# Patient Record
Sex: Male | Born: 1940
Health system: Southern US, Community
[De-identification: ages and names within clinical notes are randomized; demographics above are authoritative.]

## PROBLEM LIST (undated history)

## (undated) DIAGNOSIS — G4733 Obstructive sleep apnea (adult) (pediatric): Secondary | ICD-10-CM

## (undated) DIAGNOSIS — M1990 Unspecified osteoarthritis, unspecified site: Secondary | ICD-10-CM

## (undated) DIAGNOSIS — N400 Enlarged prostate without lower urinary tract symptoms: Secondary | ICD-10-CM

## (undated) DIAGNOSIS — R7303 Prediabetes: Secondary | ICD-10-CM

## (undated) DIAGNOSIS — M5136 Other intervertebral disc degeneration, lumbar region: Secondary | ICD-10-CM

## (undated) DIAGNOSIS — M51369 Other intervertebral disc degeneration, lumbar region without mention of lumbar back pain or lower extremity pain: Secondary | ICD-10-CM

## (undated) DIAGNOSIS — E785 Hyperlipidemia, unspecified: Secondary | ICD-10-CM

## (undated) DIAGNOSIS — R351 Nocturia: Secondary | ICD-10-CM

## (undated) DIAGNOSIS — K219 Gastro-esophageal reflux disease without esophagitis: Secondary | ICD-10-CM

## (undated) DIAGNOSIS — I10 Essential (primary) hypertension: Secondary | ICD-10-CM

## (undated) DIAGNOSIS — Z973 Presence of spectacles and contact lenses: Secondary | ICD-10-CM

## (undated) DIAGNOSIS — Z8551 Personal history of malignant neoplasm of bladder: Secondary | ICD-10-CM

## (undated) DIAGNOSIS — Z9989 Dependence on other enabling machines and devices: Secondary | ICD-10-CM

## (undated) HISTORY — PX: TRANSURETHRAL RESECTION OF BLADDER TUMOR: SHX2575

---

## 2004-06-10 ENCOUNTER — Encounter: Admission: RE | Admit: 2004-06-10 | Discharge: 2004-06-10 | Payer: Self-pay | Admitting: Cardiology

## 2004-06-13 ENCOUNTER — Encounter: Admission: RE | Admit: 2004-06-13 | Discharge: 2004-06-13 | Payer: Self-pay | Admitting: Cardiology

## 2005-08-11 ENCOUNTER — Ambulatory Visit (HOSPITAL_COMMUNITY): Admission: RE | Admit: 2005-08-11 | Discharge: 2005-08-11 | Payer: Self-pay | Admitting: Urology

## 2005-12-26 ENCOUNTER — Ambulatory Visit (HOSPITAL_COMMUNITY): Admission: RE | Admit: 2005-12-26 | Discharge: 2005-12-26 | Payer: Self-pay | Admitting: Urology

## 2007-04-25 ENCOUNTER — Encounter: Admission: RE | Admit: 2007-04-25 | Discharge: 2007-04-25 | Payer: Self-pay | Admitting: Cardiology

## 2007-06-05 ENCOUNTER — Ambulatory Visit (HOSPITAL_BASED_OUTPATIENT_CLINIC_OR_DEPARTMENT_OTHER): Admission: RE | Admit: 2007-06-05 | Discharge: 2007-06-05 | Payer: Self-pay | Admitting: Cardiology

## 2007-06-06 ENCOUNTER — Ambulatory Visit: Payer: Self-pay | Admitting: Internal Medicine

## 2008-09-23 ENCOUNTER — Emergency Department (HOSPITAL_COMMUNITY): Admission: EM | Admit: 2008-09-23 | Discharge: 2008-09-23 | Payer: Self-pay | Admitting: Emergency Medicine

## 2008-10-08 ENCOUNTER — Encounter (INDEPENDENT_AMBULATORY_CARE_PROVIDER_SITE_OTHER): Payer: Self-pay | Admitting: Urology

## 2008-10-08 ENCOUNTER — Ambulatory Visit (HOSPITAL_BASED_OUTPATIENT_CLINIC_OR_DEPARTMENT_OTHER): Admission: RE | Admit: 2008-10-08 | Discharge: 2008-10-08 | Payer: Self-pay | Admitting: Urology

## 2008-11-18 ENCOUNTER — Ambulatory Visit (HOSPITAL_BASED_OUTPATIENT_CLINIC_OR_DEPARTMENT_OTHER): Admission: RE | Admit: 2008-11-18 | Discharge: 2008-11-18 | Payer: Self-pay | Admitting: Urology

## 2008-11-18 ENCOUNTER — Encounter (INDEPENDENT_AMBULATORY_CARE_PROVIDER_SITE_OTHER): Payer: Self-pay | Admitting: Urology

## 2008-12-30 ENCOUNTER — Encounter (INDEPENDENT_AMBULATORY_CARE_PROVIDER_SITE_OTHER): Payer: Self-pay | Admitting: Urology

## 2008-12-30 ENCOUNTER — Ambulatory Visit (HOSPITAL_BASED_OUTPATIENT_CLINIC_OR_DEPARTMENT_OTHER): Admission: RE | Admit: 2008-12-30 | Discharge: 2008-12-30 | Payer: Self-pay | Admitting: Urology

## 2011-02-28 LAB — POCT I-STAT 4, (NA,K, GLUC, HGB,HCT)
Glucose, Bld: 113 mg/dL — ABNORMAL HIGH (ref 70–99)
HCT: 45 % (ref 39.0–52.0)
Sodium: 142 mEq/L (ref 135–145)

## 2011-03-01 LAB — POCT I-STAT 4, (NA,K, GLUC, HGB,HCT): Sodium: 141 mEq/L (ref 135–145)

## 2011-03-29 NOTE — Op Note (Signed)
NAME:  Tony Fox, Tony Fox              ACCOUNT NO.:  [id number]   MEDICAL RECORD NO.:  [id number]          PATIENT TYPE:  AMB   LOCATION:  [facility]                         FACILITY:  [facility]   PHYSICIAN:  Hanley Ben, M.D.  DATE OF BIRTH:  01/02/1941   DATE OF PROCEDURE:  10/08/2008  DATE OF DISCHARGE:                               OPERATIVE REPORT   PREOPERATIVE DIAGNOSIS:  Bladder tumors.   POSTOPERATIVE DIAGNOSES:  Bladder tumors.   PROCEDURES DONE:  1. Cystoscopy.  2. Transurethral resection bladder tumors.  3. Random bladder biopsy.   SURGEON:  Arvil Persons, M.D.   ANESTHESIA:  General.   INDICATIONS:  The patient is 70 year old male who had one episode of  gross hematuria about 2 weeks ago.  He went to the emergency room and a  CT scan showed some calcification on the anterior wall of the bladder.  Cystoscopy showed four tumors on the anterior wall of the bladder.  They  measure in total about 5 cm.  He is scheduled today for cystoscopy and  TUR bladder tumors.   DESCRIPTION OF PROCEDURE:  The patient was identified by his wrist band  and a proper timeout was taken.   Under general anesthesia, the patient was prepped and draped and placed  in the dorsal lithotomy position.  A panendoscope was inserted in the  bladder.  The anterior urethra was normal.  He has trilobar prostatic  hypertrophy.  The prostatic urethral length is about 5 cm.  There are  four tumors on the anterior wall of the bladder with some calcifications  on one of the tumors.  The tumors measure in total about 5 cm.  There is  no stone in the bladder.  The ureteral orifices are in normal position  and shape with clear efflux.  The cystoscope was then removed.  The  urethra was dilated up to #28-French.  Then a continuous flow Iglesias  resectoscope was inserted in the bladder.  It was a difficult resection  because of the location of the tumors on the anterior wall.  With  pressure on the anterior wall of  the bladder, I was able to resect all  the tumors.  The bladder tumors were then irrigated out of the bladder.  Then the base of the bladder tumor was resected for staging.  Hemostasis  was secured with electrocautery.  The resectoscope was then removed.  The cystoscope was reinserted in the bladder and a random bladder biopsy  was then done.  The cystoscope was then removed.  The resectoscope was  reinserted in the bladder and fulguration of the areas of biopsies was  done.  There was no evidence of bleeding at the end of the procedure.  The resectoscope was then  removed.  A #18-French Foley catheter was then inserted in the bladder.  Then 40 mg of mitomycin C in 40 ml of water were instilled in the  bladder and will be left in place for about 30 minutes.   The patient tolerated the procedure well and left the OR in satisfactory  condition to post anesthesia care unit.  Hanley Ben, M.D.  Electronically Signed     MN/MEDQ  D:  10/08/2008  T:  10/08/2008  Job:  [id number]

## 2011-03-29 NOTE — Op Note (Signed)
NAME:  Tony Fox, Tony Fox NO.:  [id number]   MEDICAL RECORD NO.:  [id number]          PATIENT TYPE:  AMB   LOCATION:  [facility]                         FACILITY:  [facility]   PHYSICIAN:  Hanley Ben, M.D.  DATE OF BIRTH:  1941-08-25   DATE OF PROCEDURE:  11/18/2008  DATE OF DISCHARGE:                               OPERATIVE REPORT   PREOPERATIVE DIAGNOSIS:  Rule out recurrent bladder tumor.   POSTOPERATIVE DIAGNOSIS:  Bladder tumor.   PROCEDURE:  Cystoscopy, TUR bladder tumor, and TUR biopsy of area of  previous resection.   SURGEON:  Arvil Persons, M.D.   ANESTHESIA:  General.   INDICATIONS:  The patient is a 70 year old male who had TUR bladder  tumor on October 08, 2008.  Pathology report showed a high-grade  invasive TCC suspicious for lymphovascular invasion.  Smooth muscle was  not identified in the specimen.  The patient is scheduled today for  cystoscopy, TUR biopsy of  the bladder tumor base to rule out muscle  invasive TCC.   The patient was identified by his wristband and proper time-out was  taken.   Under general anesthesia he was prepped and draped and placed in the  dorsal lithotomy position.  A #22 panendoscope was inserted in the  bladder.  The anterior urethra is normal.  He has trilobar prostatic  hypertrophy.  The bladder mucosa is reddened.  The ureteral orifices are  in normal position and shape.  There is an area of necrosis on the  anterior wall of the bladder, site of previous resection of the bladder  tumor.  Adjacent to that area there are 2 small superficial papillary  tumors on the right side of the area of resection of the previous  bladder tumors.  Those tumors were probably present at the time of  previous resection but were not resected.  The cystoscope was then  removed.  The urethra was dilated up to #30-French and a continuous flow  resectoscope was inserted in the bladder.  With pressure on the  abdominal wall, the small  papillary tumors were resected and sent in a  separate container to pathology.  Resection of the base from the area of  previous resected bladder tumor was done down to the muscle without  perforation of the bladder.  Several biopsies of that area were done and  those specimens were sent in a separate container also.  Then the  resectoscope was removed.  The cystoscope was reinserted in the bladder  and with the cold cup forceps biopsy of the base of the small bladder  tumors was also done.  Again because of the location of the tumor, it  was difficult to do a deep resection of the base of those bladder  tumors.  The cystoscope was then removed.  The resectoscope was  reinserted in the bladder and hemostasis was completed with  electrocautery.  Then the areas of resection of the bladder tumor wall  were fulgurated over a 2-cm resection margin.  There was no bleeding at the end of the procedure.  The bladder was  irrigated with  normal saline.  The resectoscope was then removed.  A #18-  French Foley catheter was then inserted in the bladder.   The patient tolerated the procedure well and left the OR in satisfactory  condition to postanesthesia care unit.      Hanley Ben, M.D.  Electronically Signed     MN/MEDQ  D:  11/18/2008  T:  11/18/2008  Job:  [id number]

## 2011-03-29 NOTE — Procedures (Signed)
NAME:  Tony Fox, Tony Fox NO.:  [id number]   MEDICAL RECORD NO.:  [id number]          PATIENT TYPE:  OUT   LOCATION:  [facility]                 FACILITY:  [facility]   PHYSICIAN:  Clinton D. Annamaria Boots, MD, FCCP, [place] OF BIRTH:  09/10/1941   DATE OF STUDY:  06/05/2007                            NOCTURNAL POLYSOMNOGRAM   REFERRING PHYSICIAN:  Ardyth Gal. Spruill, M.D.   INDICATION FOR STUDY:  Hypersomnia with sleep apnea   EPWORTH SLEEPINESS SCORE:  4/24, BMI 31.8, weight 225 pounds.   MEDICATIONS:  Home medications listed and reviewed.   SLEEP ARCHITECTURE:  Total sleep time 367 minutes with sleep efficiency  88%.  Stage I 3%, stage II 85%, stage III and IV absent.  RAM 11% of  total sleep time.  Sleep latency 2 minutes, RAM latency 34 minutes,  awake after sleep onset 48 minutes, arousal index 18.  No bedtime  medication taken.   RESPIRATORY DATA:  Split study protocol.  Apnea/hypopnea index (HI,  RDI), 16 obstructive events per hour indicating moderate obstructive  sleep apnea/hypopnea syndrome before C-PAP.  This reflected a total of  34 hypopneas before CPAP.  She slept only supine and all events were  recorded in that position.  RAM AHI 12 per hour.  CPAP was titrated to  14 CWP to control snoring.  AHI 0/hr.  A large Mirage Quattro mask was  used with heated humidifier.   OXYGEN DATA:  Snoring with desaturation to a nadir of 85% before CPAP.  After CPAP controlled, saturation help 94% on room air.   CARDIAC DATA:  Sinus rhythm with occasional PAC.   MOVEMENT-PARASOMNIA:  No significant movement disturbance.  Bathroom x2.   IMPRESSIONS-RECOMMENDATIONS:  1. Moderate obstructive sleep apnea/hypopnea syndrome, AHI 16/hr.  All      events were while supine.  Snoring with desaturation to a nadir of      85%.  2. Successful CPAP titration to 14 CWP, AHI 0/hr.  Lower pressures      control obstructive events but snoring      persisted to the final setting.  A  large Mirage Quattro mask was      used with heated humidifier.  3. Patient wore his oral device during his sleep study.      Clinton D. Annamaria Boots, MD, [facility], FACP  Diplomate, Tax adviser of Sleep Medicine  Electronically Signed     CDY/MEDQ  D:  06/10/2007 08:13:58  T:  06/11/2007 05:13:41  Job:  [id number]

## 2011-03-29 NOTE — Op Note (Signed)
NAME:  SOFIA, Raju              ACCOUNT NO.:  [id number]   MEDICAL RECORD NO.:  [id number]          PATIENT TYPE:  AMB   LOCATION:  [facility]                         FACILITY:  [facility]   PHYSICIAN:  Hanley Ben, M.D.  DATE OF BIRTH:  August 10, 1941   DATE OF PROCEDURE:  12/30/2008  DATE OF DISCHARGE:                               OPERATIVE REPORT   PREOPERATIVE DIAGNOSIS:  Rule out recurrent bladder tumor.   POSTOPERATIVE DIAGNOSIS:  No recurrent bladder tumor.   PROCEDURE DONE:  1. Cystoscopy.  2. TUR bladder biopsy from previous area of resection of the bladder      tumor and random bladder biopsy.   SURGEON:  Dr. Janice Norrie   ANESTHESIA:  General.   INDICATIONS:  The patient is 70 year old male who had TUR of the bladder  tumor on October 08, 2008.  He has had a high-grade papillary  urothelial carcinoma.  Repeat biopsy of the previous area of resection  of the bladder tumor was done on September 18, 2009.  There was no muscle  invasion.  He had 2 small papillary tumors adjacent to the previous area  of dissection.  Those tumors were resected.  However, the base of those  bladder tumors showed no presence of muscle.  Prior to proceeding with  BCG, he needs repeat bladder biopsy.   The patient was identified by his wristband, and proper time-out was  taken.  He was under general anesthesia.  He was prepped and draped and  placed in the dorsal lithotomy position.  A panendoscope was inserted in  the bladder.  The anterior urethra is normal.  He has trilobar prostatic  hypertrophy.  There is no evidence of gross tumor in the bladder.  The  area from previous resection shows some areas of necrosis; however,  there is no evidence of gross tumor.  The ureteral orifices are in  normal position and shape with clear efflux.  The cystoscope was  removed.  The urethra was then dilated up to #30-French with Leander Rams  sounds, and a resectoscope was inserted in the bladder.  Resection of  the  previous resected bladder tumor was done down to the level of the  muscle.  Hemostasis was secured with electrocautery.  The resectoscope  was then removed.  The cystoscope was reinserted in the bladder.  And  random bladder biopsy was then done.  The areas of biopsy were then  fulgurated with the Bugbee electrode.  There was no evidence of bleeding  at the end of the procedure.  The cystoscope was removed.  A #16-French  Foley catheter was then inserted in the bladder.   The patient tolerated the procedure well and left the OR in satisfactory  condition to postanesthesia care unit.      Hanley Ben, M.D.  Electronically Signed    MN/MEDQ  D:  12/30/2008  T:  12/30/2008  Job:  [id number]

## 2011-08-16 LAB — URINALYSIS, ROUTINE W REFLEX MICROSCOPIC
Bilirubin Urine: NEGATIVE
Glucose, UA: NEGATIVE
Ketones, ur: NEGATIVE
Protein, ur: 100 — AB
Urobilinogen, UA: 0.2
pH: 5.5

## 2011-08-16 LAB — URINE MICROSCOPIC-ADD ON

## 2011-08-16 LAB — POCT I-STAT 4, (NA,K, GLUC, HGB,HCT)
Glucose, Bld: 99
HCT: 44
Hemoglobin: 15

## 2011-08-16 LAB — URINE CULTURE

## 2011-12-12 ENCOUNTER — Other Ambulatory Visit: Payer: Self-pay | Admitting: Cardiology

## 2012-03-15 ENCOUNTER — Other Ambulatory Visit: Payer: Self-pay | Admitting: Neurosurgery

## 2012-03-15 DIAGNOSIS — M542 Cervicalgia: Secondary | ICD-10-CM

## 2012-03-15 DIAGNOSIS — M549 Dorsalgia, unspecified: Secondary | ICD-10-CM

## 2012-03-20 ENCOUNTER — Ambulatory Visit
Admission: RE | Admit: 2012-03-20 | Discharge: 2012-03-20 | Disposition: A | Discharge status: Home / Self Care | Payer: Medicare Other | Source: Ambulatory Visit | Attending: Neurosurgery | Admitting: Neurosurgery

## 2012-03-20 DIAGNOSIS — M549 Dorsalgia, unspecified: Secondary | ICD-10-CM

## 2012-03-20 DIAGNOSIS — M542 Cervicalgia: Secondary | ICD-10-CM

## 2014-02-26 ENCOUNTER — Other Ambulatory Visit: Payer: Self-pay | Admitting: Urology

## 2014-03-06 ENCOUNTER — Encounter (HOSPITAL_BASED_OUTPATIENT_CLINIC_OR_DEPARTMENT_OTHER): Payer: Self-pay | Admitting: *Deleted

## 2014-03-06 NOTE — Progress Notes (Signed)
NPO AFTER MN. ARRIVE AT 0800. NEEDS CBC, BMET, PT/INR ,  PTT AND EKG. WILL TAKE EXFORGE AND CRESTOR AM DOS W/ SIPS OF WATER.

## 2014-03-07 ENCOUNTER — Encounter (HOSPITAL_BASED_OUTPATIENT_CLINIC_OR_DEPARTMENT_OTHER): Payer: Medicare Other | Admitting: Anesthesiology

## 2014-03-07 ENCOUNTER — Ambulatory Visit (HOSPITAL_BASED_OUTPATIENT_CLINIC_OR_DEPARTMENT_OTHER)
Admission: RE | Admit: 2014-03-07 | Discharge: 2014-03-07 | Disposition: A | Discharge status: Home / Self Care | Payer: Medicare Other | Source: Ambulatory Visit | Attending: Urology | Admitting: Urology

## 2014-03-07 ENCOUNTER — Encounter (HOSPITAL_BASED_OUTPATIENT_CLINIC_OR_DEPARTMENT_OTHER)
Admission: RE | Disposition: A | Discharge status: Home / Self Care | Payer: Self-pay | Source: Ambulatory Visit | Attending: Urology

## 2014-03-07 ENCOUNTER — Encounter (HOSPITAL_BASED_OUTPATIENT_CLINIC_OR_DEPARTMENT_OTHER): Payer: Self-pay | Admitting: Anesthesiology

## 2014-03-07 ENCOUNTER — Ambulatory Visit (HOSPITAL_BASED_OUTPATIENT_CLINIC_OR_DEPARTMENT_OTHER): Payer: Medicare Other | Admitting: Anesthesiology

## 2014-03-07 DIAGNOSIS — R351 Nocturia: Secondary | ICD-10-CM | POA: Insufficient documentation

## 2014-03-07 DIAGNOSIS — M129 Arthropathy, unspecified: Secondary | ICD-10-CM | POA: Insufficient documentation

## 2014-03-07 DIAGNOSIS — N200 Calculus of kidney: Secondary | ICD-10-CM | POA: Insufficient documentation

## 2014-03-07 DIAGNOSIS — N138 Other obstructive and reflux uropathy: Secondary | ICD-10-CM | POA: Insufficient documentation

## 2014-03-07 DIAGNOSIS — Z79899 Other long term (current) drug therapy: Secondary | ICD-10-CM | POA: Insufficient documentation

## 2014-03-07 DIAGNOSIS — R3915 Urgency of urination: Secondary | ICD-10-CM | POA: Insufficient documentation

## 2014-03-07 DIAGNOSIS — Z87442 Personal history of urinary calculi: Secondary | ICD-10-CM | POA: Insufficient documentation

## 2014-03-07 DIAGNOSIS — I10 Essential (primary) hypertension: Secondary | ICD-10-CM | POA: Insufficient documentation

## 2014-03-07 DIAGNOSIS — N401 Enlarged prostate with lower urinary tract symptoms: Secondary | ICD-10-CM | POA: Insufficient documentation

## 2014-03-07 DIAGNOSIS — E7800 Pure hypercholesterolemia, unspecified: Secondary | ICD-10-CM | POA: Insufficient documentation

## 2014-03-07 DIAGNOSIS — Z8551 Personal history of malignant neoplasm of bladder: Secondary | ICD-10-CM | POA: Insufficient documentation

## 2014-03-07 DIAGNOSIS — E119 Type 2 diabetes mellitus without complications: Secondary | ICD-10-CM | POA: Insufficient documentation

## 2014-03-07 DIAGNOSIS — G4730 Sleep apnea, unspecified: Secondary | ICD-10-CM | POA: Insufficient documentation

## 2014-03-07 HISTORY — DX: Nocturia: R35.1

## 2014-03-07 HISTORY — DX: Other intervertebral disc degeneration, lumbar region without mention of lumbar back pain or lower extremity pain: M51.369

## 2014-03-07 HISTORY — DX: Prediabetes: R73.03

## 2014-03-07 HISTORY — PX: CYSTOSCOPY WITH BIOPSY: SHX5122

## 2014-03-07 HISTORY — DX: Other intervertebral disc degeneration, lumbar region: M51.36

## 2014-03-07 HISTORY — DX: Essential (primary) hypertension: I10

## 2014-03-07 HISTORY — DX: Benign prostatic hyperplasia without lower urinary tract symptoms: N40.0

## 2014-03-07 HISTORY — DX: Unspecified osteoarthritis, unspecified site: M19.90

## 2014-03-07 HISTORY — DX: Obstructive sleep apnea (adult) (pediatric): G47.33

## 2014-03-07 HISTORY — DX: Gastro-esophageal reflux disease without esophagitis: K21.9

## 2014-03-07 HISTORY — DX: Hyperlipidemia, unspecified: E78.5

## 2014-03-07 HISTORY — DX: Personal history of malignant neoplasm of bladder: Z85.51

## 2014-03-07 HISTORY — DX: Presence of spectacles and contact lenses: Z97.3

## 2014-03-07 HISTORY — DX: Dependence on other enabling machines and devices: Z99.89

## 2014-03-07 LAB — BASIC METABOLIC PANEL
BUN: 22 mg/dL (ref 6–23)
CALCIUM: 9.2 mg/dL (ref 8.4–10.5)
CO2: 23 meq/L (ref 19–32)
CREATININE: 1.41 mg/dL — AB (ref 0.50–1.35)
Chloride: 99 mEq/L (ref 96–112)
GFR calc Af Amer: 55 mL/min — ABNORMAL LOW (ref >90)
GFR calc non Af Amer: 48 mL/min — ABNORMAL LOW (ref >90)
GLUCOSE: 104 mg/dL — AB (ref 70–99)
Potassium: 4.2 mEq/L (ref 3.7–5.3)
SODIUM: 134 meq/L — AB (ref 137–147)

## 2014-03-07 LAB — CBC
HCT: 42.8 % (ref 39.0–52.0)
Hemoglobin: 14.7 g/dL (ref 13.0–17.0)
MCH: 29.5 pg (ref 26.0–34.0)
MCHC: 34.3 g/dL (ref 30.0–36.0)
MCV: 85.8 fL (ref 78.0–100.0)
Platelets: 128 10*3/uL — ABNORMAL LOW (ref 150–400)
RBC: 4.99 MIL/uL (ref 4.22–5.81)
RDW: 12.6 % (ref 11.5–15.5)
WBC: 3.5 10*3/uL — AB (ref 4.0–10.5)

## 2014-03-07 LAB — APTT: aPTT: 31 seconds (ref 24–37)

## 2014-03-07 LAB — PROTIME-INR
INR: 0.98 (ref 0.00–1.49)
Prothrombin Time: 12.8 seconds (ref 11.6–15.2)

## 2014-03-07 LAB — GLUCOSE, CAPILLARY: Glucose-Capillary: 95 mg/dL (ref 70–99)

## 2014-03-07 SURGERY — CYSTOSCOPY, WITH BIOPSY
Anesthesia: General | Site: Bladder

## 2014-03-07 MED ORDER — MEPERIDINE HCL 25 MG/ML IJ SOLN
6.2500 mg | INTRAMUSCULAR | Status: DC | PRN
  Filled 2014-03-07: qty 1

## 2014-03-07 MED ORDER — FENTANYL CITRATE 0.05 MG/ML IJ SOLN
INTRAMUSCULAR | Status: DC | PRN
  Administered 2014-03-07 (×2): 50 ug via INTRAVENOUS

## 2014-03-07 MED ORDER — LIDOCAINE HCL (CARDIAC) 20 MG/ML IV SOLN
INTRAVENOUS | Status: DC | PRN
  Administered 2014-03-07: 50 mg via INTRAVENOUS

## 2014-03-07 MED ORDER — SUCCINYLCHOLINE CHLORIDE 20 MG/ML IJ SOLN
INTRAMUSCULAR | Status: DC | PRN
  Administered 2014-03-07: 140 mg via INTRAVENOUS

## 2014-03-07 MED ORDER — ONDANSETRON HCL 4 MG/2ML IJ SOLN
INTRAMUSCULAR | Status: DC | PRN
  Administered 2014-03-07: 4 mg via INTRAVENOUS

## 2014-03-07 MED ORDER — CEFAZOLIN SODIUM 1-5 GM-% IV SOLN
1.0000 g | INTRAVENOUS | Status: DC
  Filled 2014-03-07: qty 50

## 2014-03-07 MED ORDER — STERILE WATER FOR IRRIGATION IR SOLN
Status: DC | PRN
  Administered 2014-03-07: 6000 mL

## 2014-03-07 MED ORDER — EPHEDRINE SULFATE 50 MG/ML IJ SOLN
INTRAMUSCULAR | Status: DC | PRN
  Administered 2014-03-07: 15 mg via INTRAVENOUS

## 2014-03-07 MED ORDER — DEXAMETHASONE SODIUM PHOSPHATE 4 MG/ML IJ SOLN
INTRAMUSCULAR | Status: DC | PRN
  Administered 2014-03-07: 10 mg via INTRAVENOUS

## 2014-03-07 MED ORDER — ACETAMINOPHEN 10 MG/ML IV SOLN
INTRAVENOUS | Status: DC | PRN
  Administered 2014-03-07: 1000 mg via INTRAVENOUS

## 2014-03-07 MED ORDER — PROMETHAZINE HCL 25 MG/ML IJ SOLN
6.2500 mg | INTRAMUSCULAR | Status: DC | PRN
  Filled 2014-03-07: qty 1

## 2014-03-07 MED ORDER — GLYCOPYRROLATE 0.2 MG/ML IJ SOLN
INTRAMUSCULAR | Status: DC | PRN
  Administered 2014-03-07: 0.2 mg via INTRAVENOUS

## 2014-03-07 MED ORDER — CEFAZOLIN SODIUM-DEXTROSE 2-3 GM-% IV SOLR
2.0000 g | INTRAVENOUS | Status: AC
  Administered 2014-03-07: 2 g via INTRAVENOUS
  Filled 2014-03-07: qty 50

## 2014-03-07 MED ORDER — FENTANYL CITRATE 0.05 MG/ML IJ SOLN
25.0000 ug | INTRAMUSCULAR | Status: DC | PRN
  Filled 2014-03-07: qty 1

## 2014-03-07 MED ORDER — LACTATED RINGERS IV SOLN
INTRAVENOUS | Status: DC
  Administered 2014-03-07 (×2): via INTRAVENOUS
  Filled 2014-03-07: qty 1000

## 2014-03-07 MED ORDER — FENTANYL CITRATE 0.05 MG/ML IJ SOLN
INTRAMUSCULAR | Status: AC
  Filled 2014-03-07: qty 4

## 2014-03-07 MED ORDER — PROPOFOL 10 MG/ML IV BOLUS
INTRAVENOUS | Status: DC | PRN
  Administered 2014-03-07: 180 mg via INTRAVENOUS

## 2014-03-07 SURGICAL SUPPLY — 28 items
ADAPTER CATH URET PLST 4-6FR (CATHETERS) ×2 IMPLANT
ADPR CATH URET STRL DISP 4-6FR (CATHETERS) ×1
BAG DRAIN URO-CYSTO SKYTR STRL (DRAIN) ×3 IMPLANT
BAG DRN UROCATH (DRAIN) ×1
CANISTER SUCT LVC 12 LTR MEDI- (MISCELLANEOUS) ×2 IMPLANT
CATH INTERMIT  6FR 70CM (CATHETERS) ×4 IMPLANT
CLOTH BEACON ORANGE TIMEOUT ST (SAFETY) ×3 IMPLANT
CONT SPECI 4OZ STER CLIK (MISCELLANEOUS) ×4 IMPLANT
DRAPE CAMERA CLOSED 9X96 (DRAPES) ×2 IMPLANT
ELECT REM PT RETURN 9FT ADLT (ELECTROSURGICAL) ×3
ELECTRODE REM PT RTRN 9FT ADLT (ELECTROSURGICAL) ×1 IMPLANT
GLOVE BIO SURGEON STRL SZ 6.5 (GLOVE) ×1 IMPLANT
GLOVE BIO SURGEON STRL SZ7 (GLOVE) ×3 IMPLANT
GLOVE BIO SURGEONS STRL SZ 6.5 (GLOVE) ×1
GLOVE INDICATOR 6.5 STRL GRN (GLOVE) ×2 IMPLANT
GOWN STRL REUS W/ TWL LRG LVL3 (GOWN DISPOSABLE) IMPLANT
GOWN STRL REUS W/TWL LRG LVL3 (GOWN DISPOSABLE) ×3
GOWN STRL REUS W/TWL XL LVL3 (GOWN DISPOSABLE) ×2 IMPLANT
GUIDEWIRE ANG ZIPWIRE 038X150 (WIRE) ×4 IMPLANT
GUIDEWIRE STR DUAL SENSOR (WIRE) ×2 IMPLANT
NDL SAFETY ECLIPSE 18X1.5 (NEEDLE) IMPLANT
NEEDLE HYPO 18GX1.5 SHARP (NEEDLE)
NEEDLE HYPO 22GX1.5 SAFETY (NEEDLE) IMPLANT
NS IRRIG 500ML POUR BTL (IV SOLUTION) IMPLANT
PACK CYSTOSCOPY (CUSTOM PROCEDURE TRAY) ×3 IMPLANT
SYR 20CC LL (SYRINGE) IMPLANT
SYRINGE IRR TOOMEY STRL 70CC (SYRINGE) ×2 IMPLANT
WATER STERILE IRR 3000ML UROMA (IV SOLUTION) ×3 IMPLANT

## 2014-03-07 NOTE — Anesthesia Procedure Notes (Signed)
Procedure Name: Intubation Date/Time: 03/07/2014 10:06 AM Performed by: Mechele Claude Pre-anesthesia Checklist: Patient identified, Emergency Drugs available, Suction available and Patient being monitored Patient Re-evaluated:Patient Re-evaluated prior to inductionOxygen Delivery Method: Circle System Utilized Preoxygenation: Pre-oxygenation with 100% oxygen Intubation Type: IV induction, Rapid sequence and Cricoid Pressure applied Laryngoscope Size: Mac and 4 Grade View: Grade I Tube type: Oral Tube size: 8.0 mm Number of attempts: 1 Airway Equipment and Method: stylet Placement Confirmation: ETT inserted through vocal cords under direct vision,  positive ETCO2 and breath sounds checked- equal and bilateral Secured at: 22 cm Tube secured with: Tape Dental Injury: Teeth and Oropharynx as per pre-operative assessment

## 2014-03-07 NOTE — Discharge Instructions (Signed)
CYSTOSCOPY HOME CARE INSTRUCTIONS  Activity: Rest for the remainder of the day.  Do not drive or operate equipment today.  You may resume normal activities in one to two days as instructed by your physician.   Meals: Drink plenty of liquids and eat light foods such as gelatin or soup this evening.  You may return to a normal meal plan tomorrow.  Return to Work: You may return to work in one to two days or as instructed by your physician.  Special Instructions / Symptoms: Call your physician if any of these symptoms occur:   -persistent or heavy bleeding  -bleeding which continues after first few urination  -large blood clots that are difficult to pass  -urine stream diminishes or stops completely  -fever equal to or higher than 101 degrees Farenheit.  -cloudy urine with a strong, foul odor  -severe pain  Females should always wipe from front to back after elimination.  You may feel some burning pain when you urinate.  This should disappear with time.  Applying moist heat to the lower abdomen or a hot tub bath may help relieve the pain. \  Follow-Up / Date of Return Visit to Your Physician:  *** Call for an appointment to arrange follow-up.  Patient Signature:  ________________________________________________________  Nurse's Signature:  ________________________________________________________  Post Anesthesia Home Care Instructions  Activity: Get plenty of rest for the remainder of the day. A responsible adult should stay with you for 24 hours following the procedure.  For the next 24 hours, DO NOT: -Drive a car -Paediatric nurse -Drink alcoholic beverages -Take any medication unless instructed by your physician -Make any legal decisions or sign important papers.  Meals: Start with liquid foods such as gelatin or soup. Progress to regular foods as tolerated. Avoid greasy, spicy, heavy foods. If nausea and/or vomiting occur, drink only clear liquids until the nausea and/or  vomiting subsides. Call your physician if vomiting continues.  Special Instructions/Symptoms: Your throat may feel dry or sore from the anesthesia or the breathing tube placed in your throat during surgery. If this causes discomfort, gargle with warm salt water. The discomfort should disappear within 24 hours.

## 2014-03-07 NOTE — Transfer of Care (Signed)
Immediate Anesthesia Transfer of Care Note  Patient: Tony Fox  Procedure(s) Performed: Procedure(s) (LRB): CYSTOSCOPY WITH Bladder Washings and Bladder BIOPSY (N/A)  Patient Location: PACU  Anesthesia Type: General  Level of Consciousness: awake, alert  and oriented  Airway & Oxygen Therapy: Patient Spontanous Breathing and Patient connected to face mask oxygen  Post-op Assessment: Report given to PACU RN and Post -op Vital signs reviewed and stable  Post vital signs: Reviewed and stable  Complications: No apparent anesthesia complications

## 2014-03-07 NOTE — Op Note (Signed)
Tony Fox is a 73 y.o.   03/07/2014  General  Preop diagnosis: Rule out recurrent bladder tumor. Abnormal urine cytology  Post op diagnosis: No recurrent bladder tumor  Surgeon: Charlene Brooke. [name]  Anesthesia: General  Indication: [name] is a 73 years old male who had a TUR bladder tumor in November 2009 for high-grade TCC. Repeat biopsy on 11/18/2008 and showed fragments of residual TCC. Another biopsy on 12/30/2008 revealed no residual disease. He had intravesical BCG and has not had any recurrence since. He was lost to follow her for about 3 years. Cystoscopy on 01/30/2014 showed no recurrent bladder tumor. CT scan showed known left renal calculi and no filling defect in the kidneys nor in the bladder. Urine cytology showed atypical cells. He is scheduled today for cystoscopy, bladder biopsy, washings for cytology and also washings of the renal pelves.  Procedure: The patient was identified by his wrist band and proper timeout was taken.  Under general anesthesia he was prepped and draped and placed in the dorsolithotomy position. A panendoscope was inserted in the bladder. The anterior urethra is normal. He has moderate prostatic hypertrophy. The bladder mucosa is normal there is no stone or tumor in the bladder. The ureteral orifices are in normal position and shape. The bladder was then irrigated with normal saline and bladder washings were sent for cytology.  A Glidewire was passed and through a #6 [place] open-ended catheter and through the cystoscope and the left ureter up to the renal pelvis. The Glidewire was removed. Urine from the left renal pelvis was obtained for cytology. The renal pelvis was irrigated with normal saline and washings were sent also for cytology. The Glidewire and open-ended ureteral catheter were removed and discarded. Then a new open-ended catheter and a new glide wire were passed through the cystoscope and the right ureter up to the renal pelvis. The  Glidewire was removed. Washings from the right renal pelvis were sent for cytology. The open-ended catheter was removed.  Random bladder biopsy was done with the cold cup forceps. The areas of biopsy were then fulgurated with the Bugbee electrode. There was no evidence of bleeding at the end of the procedure. The bladder was emptied and the cystoscope removed.  The patient tolerated the procedure well and left the OR in satisfactory condition to postanesthesia care unit

## 2014-03-07 NOTE — Anesthesia Postprocedure Evaluation (Signed)
  Anesthesia Post-op Note  Patient: Tony Fox  Procedure(s) Performed: Procedure(s) (LRB): CYSTOSCOPY WITH Bladder Washings and Bladder BIOPSY (N/A)  Patient Location: PACU  Anesthesia Type: General  Level of Consciousness: awake and alert   Airway and Oxygen Therapy: Patient Spontanous Breathing  Post-op Pain: mild  Post-op Assessment: Post-op Vital signs reviewed, Patient's Cardiovascular Status Stable, Respiratory Function Stable, Patent Airway and No signs of Nausea or vomiting  Last Vitals:  Filed Vitals:   03/07/14 1130  BP: 121/70  Pulse: 63  Temp:   Resp: 18    Post-op Vital Signs: stable   Complications: No apparent anesthesia complications

## 2014-03-07 NOTE — H&P (Signed)
History of Present Illness [name] had TURBT in November 2009 for high grade TCC. Repeat biopsy on 11/18/08 showed fragments of residual TCC. Another biopsy on 12/30/08 revealed no residual disease. He was treated with intravesical BCG and has not had any recurrence since. He was last seen in December 2011. He has not had hematuria or dysuria. I called him and asked him to return to the office for follow-up. Cystoscopy today shows no recurrent bladder tumor. PSA was 1.05 in December 2011. He has a history of kidney stone. He has not had any flank pain.  Urine cytology showed atypical urothelial cells.  Fish revealed abnormal findings.  He is scheduled for cystoscopy, washings from the renal pelves and bladder washings and bladder biopsy. Past Medical History Problems  1. History of Arthritis (V13.4) 2. History of diabetes mellitus (V12.29) 3. History of heartburn (V12.79) 4. History of hypercholesterolemia (V12.29) 5. History of hypertension (V12.59) 6. History of kidney stones (V13.01) 7. History of sleep apnea (V13.89) 8. Personal history of bladder cancer (V10.51)  Surgical History Problems  1. History of Cystoscopy To Insert Guide Wire For Retrograde Nephrostomy 2. History of Cystoscopy With Biopsy 3. History of Cystoscopy With Fulguration Medium Lesion (2-5cm)  Current Meds 1. Crestor TABS;  Therapy: (Recorded:08Jul2009) to Recorded 2. Doxazosin Mesylate 4 MG Oral Tablet;  Therapy: 16Sep2014 to Recorded 3. Exforge TABS (Amlodipine Besylate-Valsartan);  Therapy: (Recorded:08Jul2009) to Recorded 4. Hydrochlorothiazide 25 MG Oral Tablet;  Therapy: 26Apr2014 to Recorded 5. MetFORMIN HCl - 500 MG Oral Tablet;  Therapy: [id number] to Recorded 6. PriLOSEC PACK;  Therapy: (Recorded:28Dec2011) to Recorded 7. Viagra 100 MG Oral Tablet;  Therapy: [id number] to Recorded  Allergies Medication  1. No Known Drug Allergies  Family History Problems  1. Family history of Acute Myocardial  Infarction (V17.3) 2. Family history of Alzheimer's Disease : Mother 3. Family history of Family Health Status Number Of Children   2 sons and 2 daughters 70. Family history of Father Deceased At Age ____   76yrs, stroke 5. Family history of Mother Deceased At Age ____   40yrs 6. Family history of Stroke Syndrome (V17.1) : Father 7. Family history of Stroke Syndrome (V17.1)  Social History Problems  1. Denied: History of Alcohol Use 2. Caffeine Use   2 qd 3. Marital History - Currently Married 4. Never A Smoker 5. Occupation:   retired  Review of Systems Genitourinary, constitutional, skin, eye, otolaryngeal, hematologic/lymphatic, cardiovascular, pulmonary, endocrine, musculoskeletal, gastrointestinal, neurological and psychiatric system(s) were reviewed and pertinent findings if present are noted.  Genitourinary: feelings of urinary urgency and nocturia.    Vitals Vital Signs [Data Includes: Last 1 Day]  Recorded: [id number] 03:20PM  Height: 5 ft 10 in Weight: 196 lb  BMI Calculated: 28.12 BSA Calculated: 2.07 Blood Pressure: 119 / 72 Heart Rate: 71 Respiration: 18 HEENT: WNL Lungs: clear.   Heart: Regular rhythm. Abdomen: Soft, non distended, non tender.  No CVA tenderness. Bowel sounds: normal Genitalia: Penis and scrotal contents are within normal limits Rectal: Sphincter tone: Normal.  Prostate enlarged 40 gm.  No nodules. Extremities: WNL  Results/Data Urine [Data Includes: Last 1 Day]   [id number] COLOR YELLOW  APPEARANCE CLEAR  SPECIFIC GRAVITY 1.025  pH 6.0  GLUCOSE NEG mg/dL BILIRUBIN NEG  KETONE NEG mg/dL BLOOD SMALL  PROTEIN NEG mg/dL UROBILINOGEN 0.2 mg/dL NITRITE NEG  LEUKOCYTE ESTERASE NEG  SQUAMOUS EPITHELIAL/HPF RARE  WBC 0-2 WBC/hpf RBC 3-6 RBC/hpf BACTERIA NONE SEEN  CRYSTALS NONE SEEN  CASTS NONE SEEN  Procedure  Procedure: Cystoscopy   Indication: History of Urothelial Carcinoma.  Informed Consent: Risks, benefits, and  potential adverse events were discussed and informed consent was obtained from the patient . Specific risks including, but not limited to bleeding, infection, pain, allergic reaction etc. were explained.  Prep: The patient was prepped with betadine.  Anesthesia:. Local anesthesia was administered intraurethrally with 2% lidocaine jelly.  Antibiotic prophylaxis: Ciprofloxacin.  Procedure Note:  Urethral meatus:. No abnormalities.  Anterior urethra: No abnormalities.  Prostatic urethra:. The lateral and median prostatic lobes were enlarged.  Bladder: Visulization was clear. The ureteral orifices were in the normal anatomic position bilaterally. A systematic survey of the bladder demonstrated no bladder tumors or stones. The mucosa was smooth without abnormalities. The patient tolerated the procedure well.  Complications: None.    Assessment Assessed  1. Personal history of bladder cancer (V10.51) 2. Prostate cancer screening (V76.44) 3. Nephrolithiasis (592.0)  Plan PMH: Personal history of bladder cancer  1. Cysto; Status:Canceled - Date of Service;  Prostate cancer screening  2. VENIPUNCTURE; Status:Complete;   [id number] Cystoscopy, bladder washings, bladder biopsy, washings from the renal pelves for cytology.

## 2014-03-07 NOTE — Anesthesia Preprocedure Evaluation (Addendum)
Anesthesia Evaluation  Patient identified by MRN, date of birth, ID band Patient awake    Reviewed: Allergy & Precautions, H&P , NPO status , Patient's Chart, lab work & pertinent test results  Airway Mallampati: II TM Distance: >3 FB Neck ROM: Full    Dental no notable dental hx.    Pulmonary sleep apnea and Continuous Positive Airway Pressure Ventilation ,  breath sounds clear to auscultation  Pulmonary exam normal       Cardiovascular hypertension, Pt. on medications Rhythm:Regular Rate:Normal     Neuro/Psych negative neurological ROS  negative psych ROS   GI/Hepatic Neg liver ROS, GERD-  ,  Endo/Other  negative endocrine ROS  Renal/GU negative Renal ROS  negative genitourinary   Musculoskeletal negative musculoskeletal ROS (+)   Abdominal   Peds negative pediatric ROS (+)  Hematology negative hematology ROS (+)   Anesthesia Other Findings   Reproductive/Obstetrics negative OB ROS                         Anesthesia Physical Anesthesia Plan  ASA: III  Anesthesia Plan: General   Post-op Pain Management:    Induction: Intravenous  Airway Management Planned: Oral ETT  Additional Equipment:   Intra-op Plan:   Post-operative Plan:   Informed Consent: I have reviewed the patients History and Physical, chart, labs and discussed the procedure including the risks, benefits and alternatives for the proposed anesthesia with the patient or authorized representative who has indicated his/her understanding and acceptance.   Dental advisory given  Plan Discussed with: CRNA  Anesthesia Plan Comments:        Anesthesia Quick Evaluation

## 2014-03-10 ENCOUNTER — Encounter (HOSPITAL_BASED_OUTPATIENT_CLINIC_OR_DEPARTMENT_OTHER): Payer: Self-pay | Admitting: Urology

## 2014-12-25 ENCOUNTER — Other Ambulatory Visit: Payer: Self-pay | Admitting: Urology

## 2014-12-25 ENCOUNTER — Encounter (HOSPITAL_BASED_OUTPATIENT_CLINIC_OR_DEPARTMENT_OTHER): Payer: Self-pay | Admitting: *Deleted

## 2014-12-25 NOTE — Progress Notes (Signed)
NPO AFTER MN. ARRIVE AT 1030. NEEDS ISTAT AND EKG. WILL TAKE CRESTOR AND EXFORGE AM DOS W/ SIPS OF WATER.

## 2014-12-30 ENCOUNTER — Ambulatory Visit (HOSPITAL_BASED_OUTPATIENT_CLINIC_OR_DEPARTMENT_OTHER)
Admission: RE | Admit: 2014-12-30 | Discharge: 2014-12-30 | Disposition: A | Discharge status: Home / Self Care | Payer: Medicare Other | Source: Ambulatory Visit | Attending: Urology | Admitting: Urology

## 2014-12-30 ENCOUNTER — Encounter (HOSPITAL_BASED_OUTPATIENT_CLINIC_OR_DEPARTMENT_OTHER): Payer: Self-pay

## 2014-12-30 ENCOUNTER — Ambulatory Visit (HOSPITAL_BASED_OUTPATIENT_CLINIC_OR_DEPARTMENT_OTHER): Payer: Medicare Other | Admitting: Anesthesiology

## 2014-12-30 ENCOUNTER — Encounter (HOSPITAL_BASED_OUTPATIENT_CLINIC_OR_DEPARTMENT_OTHER)
Admission: RE | Disposition: A | Discharge status: Home / Self Care | Payer: Self-pay | Source: Ambulatory Visit | Attending: Urology

## 2014-12-30 DIAGNOSIS — E780 Pure hypercholesterolemia: Secondary | ICD-10-CM | POA: Diagnosis not present

## 2014-12-30 DIAGNOSIS — Z87442 Personal history of urinary calculi: Secondary | ICD-10-CM | POA: Diagnosis not present

## 2014-12-30 DIAGNOSIS — N400 Enlarged prostate without lower urinary tract symptoms: Secondary | ICD-10-CM | POA: Insufficient documentation

## 2014-12-30 DIAGNOSIS — Z8551 Personal history of malignant neoplasm of bladder: Secondary | ICD-10-CM | POA: Diagnosis present

## 2014-12-30 DIAGNOSIS — M1990 Unspecified osteoarthritis, unspecified site: Secondary | ICD-10-CM | POA: Diagnosis not present

## 2014-12-30 DIAGNOSIS — G4730 Sleep apnea, unspecified: Secondary | ICD-10-CM | POA: Diagnosis not present

## 2014-12-30 DIAGNOSIS — I10 Essential (primary) hypertension: Secondary | ICD-10-CM | POA: Diagnosis not present

## 2014-12-30 DIAGNOSIS — E119 Type 2 diabetes mellitus without complications: Secondary | ICD-10-CM | POA: Insufficient documentation

## 2014-12-30 HISTORY — PX: CYSTOSCOPY WITH BIOPSY: SHX5122

## 2014-12-30 HISTORY — PX: CYSTOSCOPY W/ RETROGRADES: SHX1426

## 2014-12-30 HISTORY — PX: TRANSURETHRAL RESECTION OF PROSTATE: SHX73

## 2014-12-30 LAB — POCT I-STAT, CHEM 8
BUN: 19 mg/dL (ref 6–23)
CALCIUM ION: 1.24 mmol/L (ref 1.13–1.30)
Chloride: 103 mmol/L (ref 96–112)
Creatinine, Ser: 1.3 mg/dL (ref 0.50–1.35)
Glucose, Bld: 95 mg/dL (ref 70–99)
HCT: 48 % (ref 39.0–52.0)
Hemoglobin: 16.3 g/dL (ref 13.0–17.0)
Potassium: 4.4 mmol/L (ref 3.5–5.1)
SODIUM: 140 mmol/L (ref 135–145)
TCO2: 22 mmol/L (ref 0–100)

## 2014-12-30 SURGERY — TURP (TRANSURETHRAL RESECTION OF PROSTATE)
Anesthesia: General | Site: Prostate

## 2014-12-30 MED ORDER — SODIUM CHLORIDE 0.9 % IR SOLN
Status: DC | PRN
  Administered 2014-12-30: 4000 mL

## 2014-12-30 MED ORDER — MIDAZOLAM HCL 2 MG/2ML IJ SOLN
INTRAMUSCULAR | Status: AC
Start: 2014-12-30 — End: 2014-12-30
  Filled 2014-12-30: qty 2

## 2014-12-30 MED ORDER — MEPERIDINE HCL 25 MG/ML IJ SOLN
6.2500 mg | INTRAMUSCULAR | Status: DC | PRN
  Filled 2014-12-30: qty 1

## 2014-12-30 MED ORDER — FENTANYL CITRATE 0.05 MG/ML IJ SOLN
INTRAMUSCULAR | Status: AC
  Filled 2014-12-30: qty 6

## 2014-12-30 MED ORDER — CEFAZOLIN SODIUM-DEXTROSE 2-3 GM-% IV SOLR
2.0000 g | INTRAVENOUS | Status: AC
  Administered 2014-12-30: 2 g via INTRAVENOUS
  Filled 2014-12-30: qty 50

## 2014-12-30 MED ORDER — FENTANYL CITRATE 0.05 MG/ML IJ SOLN
INTRAMUSCULAR | Status: DC | PRN
  Administered 2014-12-30 (×4): 25 ug via INTRAVENOUS

## 2014-12-30 MED ORDER — DEXAMETHASONE SODIUM PHOSPHATE 4 MG/ML IJ SOLN
INTRAMUSCULAR | Status: DC | PRN
  Administered 2014-12-30: 4 mg via INTRAVENOUS

## 2014-12-30 MED ORDER — LACTATED RINGERS IV SOLN
INTRAVENOUS | Status: DC
  Administered 2014-12-30 (×2): via INTRAVENOUS
  Filled 2014-12-30: qty 1000

## 2014-12-30 MED ORDER — PROMETHAZINE HCL 25 MG/ML IJ SOLN
6.2500 mg | INTRAMUSCULAR | Status: DC | PRN
  Filled 2014-12-30: qty 1

## 2014-12-30 MED ORDER — LIDOCAINE HCL (CARDIAC) 20 MG/ML IV SOLN
INTRAVENOUS | Status: DC | PRN
  Administered 2014-12-30: 60 mg via INTRAVENOUS

## 2014-12-30 MED ORDER — CEFAZOLIN SODIUM 1-5 GM-% IV SOLN
1.0000 g | INTRAVENOUS | Status: DC
  Filled 2014-12-30: qty 50

## 2014-12-30 MED ORDER — PROPOFOL 10 MG/ML IV BOLUS
INTRAVENOUS | Status: DC | PRN
  Administered 2014-12-30: 170 mg via INTRAVENOUS

## 2014-12-30 MED ORDER — FENTANYL CITRATE 0.05 MG/ML IJ SOLN
25.0000 ug | INTRAMUSCULAR | Status: DC | PRN
  Filled 2014-12-30: qty 1

## 2014-12-30 MED ORDER — ONDANSETRON HCL 4 MG/2ML IJ SOLN
INTRAMUSCULAR | Status: DC | PRN
  Administered 2014-12-30: 4 mg via INTRAVENOUS

## 2014-12-30 MED ORDER — CEFAZOLIN SODIUM-DEXTROSE 2-3 GM-% IV SOLR
INTRAVENOUS | Status: AC
  Filled 2014-12-30: qty 50

## 2014-12-30 MED ORDER — STERILE WATER FOR IRRIGATION IR SOLN
Status: DC | PRN
  Administered 2014-12-30: 500 mL

## 2014-12-30 SURGICAL SUPPLY — 70 items
ADAPTER CATH URET PLST 4-6FR (CATHETERS) IMPLANT
ADPR CATH URET STRL DISP 4-6FR (CATHETERS)
BAG DRAIN URO-CYSTO SKYTR STRL (DRAIN) ×5 IMPLANT
BAG DRN ANRFLXCHMBR STRAP LEK (BAG) ×3
BAG DRN UROCATH (DRAIN) ×3
BAG URINE DRAINAGE (UROLOGICAL SUPPLIES) IMPLANT
BAG URINE LEG 19OZ MD ST LTX (BAG) ×2 IMPLANT
BASKET LASER NITINOL 1.9FR (BASKET) IMPLANT
BASKET SEGURA 3FR (UROLOGICAL SUPPLIES) IMPLANT
BASKET STNLS GEMINI 4WIRE 3FR (BASKET) IMPLANT
BASKET ZERO TIP NITINOL 2.4FR (BASKET) IMPLANT
BSKT STON RTRVL 120 1.9FR (BASKET)
BSKT STON RTRVL GEM 120X11 3FR (BASKET)
BSKT STON RTRVL ZERO TP 2.4FR (BASKET)
CANISTER SUCT LVC 12 LTR MEDI- (MISCELLANEOUS) ×2 IMPLANT
CATH FOLEY 2WAY SLVR  5CC 20FR (CATHETERS) ×2
CATH FOLEY 2WAY SLVR  5CC 22FR (CATHETERS)
CATH FOLEY 2WAY SLVR 5CC 20FR (CATHETERS) IMPLANT
CATH FOLEY 2WAY SLVR 5CC 22FR (CATHETERS) IMPLANT
CATH FOLEY 3WAY 30CC 24FR (CATHETERS)
CATH HEMA 3WAY 30CC 24FR COUDE (CATHETERS) IMPLANT
CATH HEMA 3WAY 30CC 24FR RND (CATHETERS) IMPLANT
CATH INTERMIT  6FR 70CM (CATHETERS) ×4 IMPLANT
CATH URET 5FR 28IN CONE TIP (BALLOONS)
CATH URET 5FR 28IN OPEN ENDED (CATHETERS) IMPLANT
CATH URET 5FR 70CM CONE TIP (BALLOONS) IMPLANT
CATH URTH STD 24FR FL 3W 2 (CATHETERS) IMPLANT
CLOTH BEACON ORANGE TIMEOUT ST (SAFETY) ×5 IMPLANT
DRAPE CAMERA CLOSED 9X96 (DRAPES) ×5 IMPLANT
ELECT BUTTON HF 24-28F 2 30DE (ELECTRODE) IMPLANT
ELECT LOOP MED HF 24F 12D (CUTTING LOOP) IMPLANT
ELECT LOOP MED HF 24F 12D CBL (CLIP) ×2 IMPLANT
ELECT REM PT RETURN 9FT ADLT (ELECTROSURGICAL) ×5
ELECTRODE REM PT RTRN 9FT ADLT (ELECTROSURGICAL) ×3 IMPLANT
EVACUATOR MICROVAS BLADDER (UROLOGICAL SUPPLIES) ×3 IMPLANT
FIBER LASER FLEXIVA 1000 (UROLOGICAL SUPPLIES) IMPLANT
FIBER LASER FLEXIVA 200 (UROLOGICAL SUPPLIES) IMPLANT
FIBER LASER FLEXIVA 365 (UROLOGICAL SUPPLIES) IMPLANT
FIBER LASER FLEXIVA 550 (UROLOGICAL SUPPLIES) IMPLANT
GLOVE BIO SURGEON STRL SZ7 (GLOVE) ×5 IMPLANT
GLOVE BIOGEL M 6.5 STRL (GLOVE) ×2 IMPLANT
GLOVE BIOGEL PI IND STRL 6.5 (GLOVE) IMPLANT
GLOVE BIOGEL PI IND STRL 8.5 (GLOVE) IMPLANT
GLOVE BIOGEL PI INDICATOR 6.5 (GLOVE) ×4
GLOVE BIOGEL PI INDICATOR 8.5 (GLOVE)
GOWN PREVENTION PLUS LG XLONG (DISPOSABLE) ×3 IMPLANT
GOWN STRL REUS W/TWL LRG LVL3 (GOWN DISPOSABLE) ×4 IMPLANT
GOWN STRL REUS W/TWL XL LVL3 (GOWN DISPOSABLE) ×2 IMPLANT
GOWN XL W/COTTON TOWEL STD (GOWNS) ×3 IMPLANT
GUIDEWIRE 0.038 PTFE COATED (WIRE) IMPLANT
GUIDEWIRE ANG ZIPWIRE 038X150 (WIRE) IMPLANT
GUIDEWIRE STR DUAL SENSOR (WIRE) ×4 IMPLANT
HOLDER FOLEY CATH W/STRAP (MISCELLANEOUS) IMPLANT
IV NS IRRIG 3000ML ARTHROMATIC (IV SOLUTION) ×2 IMPLANT
KIT BALLIN UROMAX 15FX10 (LABEL) IMPLANT
KIT BALLN UROMAX 15FX4 (MISCELLANEOUS) IMPLANT
KIT BALLN UROMAX 26 75X4 (MISCELLANEOUS)
LOOP CUTTING 24FR OLYMPUS (CUTTING LOOP) IMPLANT
NDL SAFETY ECLIPSE 18X1.5 (NEEDLE) IMPLANT
NEEDLE HYPO 18GX1.5 SHARP (NEEDLE)
NEEDLE HYPO 22GX1.5 SAFETY (NEEDLE) IMPLANT
NS IRRIG 500ML POUR BTL (IV SOLUTION) ×2 IMPLANT
PACK CYSTO (CUSTOM PROCEDURE TRAY) ×5 IMPLANT
PLUG CATH AND CAP STER (CATHETERS) IMPLANT
SET ASPIRATION TUBING (TUBING) IMPLANT
SET HIGH PRES BAL DIL (LABEL)
SYR 20CC LL (SYRINGE) IMPLANT
SYR 30ML LL (SYRINGE) IMPLANT
WATER STERILE IRR 3000ML UROMA (IV SOLUTION) ×7 IMPLANT
WATER STERILE IRR 500ML POUR (IV SOLUTION) ×2 IMPLANT

## 2014-12-30 NOTE — H&P (Signed)
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 Repeat biopsy on 11/18/08 showed fragments of residual TCC. Another biopsy on 12/30/08 revealed no residual disease. He was treated with intravesical BCG and has not had any recurrence since. He has not had hematuria or dysuria. He had a positive urine cytology in March 2015. Cystoscopy with bladder biopsy  on 03/07/14   revealed no malignancy. He is known to have non obstructing renal calculi. Cystoscopy today shows no recurrent bladder tumor. Urine cytology and FISH test on 11/13/14 were positive.  He needs repeat cystoscopy,retrograde pyelogram, bladder biopsy, TUR biopsy prostatic urethra.  The procedure, risks, benefits were explained to the patient.  The risks include but are not limited to hemorrhage, infection, ureteral or bladder injury.  He understands and wishes to proceed.       Past Medical History Problems  1. History of Arthritis 2. History of diabetes mellitus (Z86.39) 3. History of heartburn (Z87.898) 4. History of hypercholesterolemia (Z86.39) 5. History of hypertension (Z86.79) 6. History of kidney stones (Z87.442) 7. History of sleep apnea (Z87.09) 8. Personal history of bladder cancer (Z85.51)  Surgical History Problems  1. History of Cystoscopy To Insert Guide Wire For Retrograde Nephrostomy 2. History of Cystoscopy With Biopsy 3. History of Cystoscopy With Biopsy 4. History of Cystoscopy With Fulguration Medium Lesion (2-5cm)  Current Meds 1. Crestor TABS;  Therapy: (Recorded:08Jul2009) to Recorded 2. Doxazosin Mesylate 4 MG Oral Tablet;  Therapy: 16Sep2014 to Recorded 3. Exforge TABS;  Therapy: (Recorded:08Jul2009) to Recorded 4. Hydrochlorothiazide 25 MG Oral Tablet;  Therapy: 26Apr2014 to Recorded 5. MetFORMIN HCl - 500 MG Oral Tablet;  Therapy: [id number] to Recorded 6. PriLOSEC PACK;  Therapy: (Recorded:28Dec2011) to Recorded 7. Viagra 100 MG Oral Tablet;  Therapy:  [id number] to Recorded  Allergies Medication  1. No Known Drug Allergies  Family History Problems  1. Family history of Acute Myocardial Infarction 2. Family history of Alzheimer's Disease : Mother 3. Family history of Family Health Status Number Of Children   2 sons and 2 daughters 37. Family history of Father Deceased At Age ____   31yrs, stroke 5. Family history of Mother Deceased At Age ____   45yrs 6. Family history of Stroke Syndrome : Father 87. Family history of Stroke Syndrome  Social History Problems  1. Denied: History of Alcohol Use 2. Caffeine Use   2 qd 3. Marital History - Currently Married 4. Never A Smoker 5. Occupation:   retired  Review of Systems Genitourinary, constitutional, skin, eye, otolaryngeal, hematologic/lymphatic, cardiovascular, pulmonary, endocrine, musculoskeletal, gastrointestinal, neurological and psychiatric system(s) were reviewed and pertinent findings if present are noted and are otherwise negative.  Genitourinary: nocturia.    Physical Exam Constitutional: Well nourished and well developed . No acute distress. HEENT: Normal.  No cervical adenopathy.  No thyromegaly. Chest: Symmetrical.   Lungs clear to percussion and auscultation. Heart: Regular rhythm.  No murmurs.  No gallops. Abdomen:  Soft, non distended, non tender.  No hepatomegaly, no splenomegaly.  Bowel sounds: normal Genitalia: Penis and scrotal contents are unremarkable. No testicular mass. No inguinal adenopathy. Rectal: Sphincter tone: normal.  Prostate enlarged.  No nodules.  Seminal vesicles are not palpable. Extremities: normal. No edema, no deformities.     Results/Data Urine [Data Includes: Last 1 Day]   [id number] COLOR YELLOW  APPEARANCE CLEAR  SPECIFIC GRAVITY 1.025  pH 5.5  GLUCOSE NEG mg/dL BILIRUBIN NEG  KETONE NEG mg/dL BLOOD SMALL  PROTEIN NEG mg/dL UROBILINOGEN  0.2 mg/dL NITRITE NEG  LEUKOCYTE ESTERASE NEG  SQUAMOUS EPITHELIAL/HPF RARE   WBC NONE SEEN WBC/hpf RBC 7-10 RBC/hpf BACTERIA NONE SEEN  CRYSTALS NONE SEEN  CASTS NONE SEEN   Procedure  Procedure: Cystoscopy   Indication: History of Urothelial Carcinoma.  Informed Consent: Risks, benefits, and potential adverse events were discussed and informed consent was obtained from the patient . Specific risks including, but not limited to bleeding, infection, pain, allergic reaction etc. were explained.  Prep: The patient was prepped with betadine.  Anesthesia:. Local anesthesia was administered intraurethrally with 2% lidocaine jelly.  Antibiotic prophylaxis: Ciprofloxacin.  Procedure Note:  Urethral meatus:. No abnormalities.  Anterior urethra: No abnormalities.  Prostatic urethra:. The lateral and median prostatic lobes were enlarged.  Bladder: Visulization was clear. The ureteral orifices were in the normal anatomic position bilaterally. A systematic survey of the bladder demonstrated no bladder tumors or stones. The mucosa was smooth without abnormalities. The patient tolerated the procedure well.  Complications: None.    Assessment Assessed  1. H/O Malignant neoplasm of bladder (C67.9) 2. Nephrolithiasis (N20.0)  Plan:  Cystoscopy, retrograde pyelograms, bladder biopsy, TUR prostate biopsy.

## 2014-12-30 NOTE — Discharge Instructions (Addendum)
Post Anesthesia Home Care Instructions  Activity: Get plenty of rest for the remainder of the day. A responsible adult should stay with you for 24 hours following the procedure.  For the next 24 hours, DO NOT: -Drive a car -Paediatric nurse -Drink alcoholic beverages -Take any medication unless instructed by your physician -Make any legal decisions or sign important papers.  Meals: Start with liquid foods such as gelatin or soup. Progress to regular foods as tolerated. Avoid greasy, spicy, heavy foods. If nausea and/or vomiting occur, drink only clear liquids until the nausea and/or vomiting subsides. Call your physician if vomiting continues.  Special Instructions/Symptoms: Your throat may feel dry or sore from the anesthesia or the breathing tube placed in your throat during surgery. If this causes discomfort, gargle with warm salt water. The discomfort should disappear within 24 hours.  CYSTOSCOPY HOME CARE INSTRUCTIONS  Activity: Rest for the remainder of the day.  Do not drive or operate equipment today.  You may resume normal activities in one to two days as instructed by your physician.   Meals: Drink plenty of liquids and eat light foods such as gelatin or soup this evening.  You may return to a normal meal plan tomorrow.  Return to Work: You may return to work in one to two days or as instructed by your physician.  Special Instructions / Symptoms: Call your physician if any of these symptoms occur:   -persistent or heavy bleeding              large blood clots that are difficult to pass  -urine stream diminishes or stops completely  -fever equal to or higher than 101 degrees Farenheit.  -cloudy urine with a strong, foul odor  -severe pain  Females should always wipe from front to back after elimination.  You may feel some burning pain when you urinate.  This should disappear with time.  Applying moist heat to the lower abdomen or a hot tub bath may help relieve the  pain. \  Follow-Up / Date of Return Visit to Your Physician:  Call for an appointment to arrange follow-up.  Patient Signature:  ________________________________________________________  Nurse's Signature:  ________________________________________________________ Indwelling Urinary Catheter Care  You have been given a flexible tube (catheter) used to drain the bladder. Catheters are often used when a person has difficulty urinating due to blockage, bleeding, infection, or inability to control bladder or bowel movements (incontinence). A catheter requires daily care to prevent infection and blockage. HOME CARE INSTRUCTIONS  Do the following to reduce the risk of infection. Antibiotic medicines cannot prevent infections. Limit the number of bacteria entering your bladder  Wash your hands for 2 minutes with soapy water before and after handling the catheter.  Wash your bottom and the entire catheter twice daily, as well as after each bowel movement. Wash the tip of the penis or just above the vaginal opening with soap and warm water, rinse, and then wash the rectal area. Always wash from front to back.  When changing from the leg bag to overnight bag or from the overnight bag to leg bag, thoroughly clean the end of the catheter where it connects to the tubing with an alcohol wipe.  Clean the leg bag and overnight bag daily after use. Replace your drainage bags weekly.  Always keep the tubing and bag below the level of your bladder. This allows your urine to drain properly. Lifting the bag or tubing above the level of your bladder will cause dirty  urine to flow back into your bladder. If you must briefly lift the bag higher than your bladder, pinch the catheter or tubing to prevent backflow.  Drink enough water and fluids to keep your urine clear or pale yellow, or as directed by your caregiver. This will flush bacteria out of the bladder. Protect tissues from injury  Attach the catheter to  your leg so there is no tension on the catheter. Use adhesive tape or a leg strap. If you are using adhesive tape, remove any sticky residue left behind by the previous tape you used.  Place your leg bag on your lower leg. Fasten the straps securely and comfortably.  Do not remove the catheter yourself unless you have been instructed how to do so. Keep the urinary pathway open  Check throughout the day to be sure your catheter is working and urine is draining freely. Make sure the tubing does not become kinked.  Do not let the drainage bag overfill. SEEK IMMEDIATE MEDICAL CARE IF:   The catheter becomes blocked. Urine is not draining.  Urine is leaking.  You have any pain.  You have a fever. Document Released: 10/31/2005 Document Revised: 10/17/2012 Document Reviewed: 04/01/2010 [facility] Patient Information 2015 [place], [place]. This information is not intended to replace advice given to you by your health care provider. Make sure you discuss any questions you have with your health care provider.

## 2014-12-30 NOTE — Anesthesia Postprocedure Evaluation (Signed)
  Anesthesia Post-op Note  Patient: Tony Fox  Procedure(s) Performed: Procedure(s) (LRB): TRANSURETHRAL RESECTION PROSTATE BIOPSY (N/A) CYSTOSCOPY WITH RETROGRADE PYELOGRAM (Bilateral) CYSTOSCOPY WITH BIOPSY (N/A)  Patient Location: PACU  Anesthesia Type: General  Level of Consciousness: awake and alert   Airway and Oxygen Therapy: Patient Spontanous Breathing  Post-op Pain: mild  Post-op Assessment: Post-op Vital signs reviewed, Patient's Cardiovascular Status Stable, Respiratory Function Stable, Patent Airway and No signs of Nausea or vomiting  Last Vitals:  Filed Vitals:   12/30/14 1230  BP: 164/71  Pulse: 56  Temp: 36.6 C  Resp: 16    Post-op Vital Signs: stable   Complications: No apparent anesthesia complications

## 2014-12-30 NOTE — Anesthesia Procedure Notes (Signed)
Procedure Name: LMA Insertion Date/Time: 12/30/2014 11:24 AM Performed by: Denna Haggard D Pre-anesthesia Checklist: Patient identified, Emergency Drugs available, Suction available and Patient being monitored Patient Re-evaluated:Patient Re-evaluated prior to inductionOxygen Delivery Method: Circle System Utilized Preoxygenation: Pre-oxygenation with 100% oxygen Intubation Type: IV induction Ventilation: Mask ventilation without difficulty LMA: LMA inserted LMA Size: 5.0 Number of attempts: 1 Airway Equipment and Method: Bite block Placement Confirmation: positive ETCO2 Tube secured with: Tape Dental Injury: Teeth and Oropharynx as per pre-operative assessment

## 2014-12-30 NOTE — Transfer of Care (Signed)
Immediate Anesthesia Transfer of Care Note  Patient: Tony Fox  Procedure(s) Performed: Procedure(s) (LRB): TRANSURETHRAL RESECTION PROSTATE BIOPSY (N/A) CYSTOSCOPY WITH RETROGRADE PYELOGRAM (Bilateral) CYSTOSCOPY WITH BIOPSY (N/A)  Patient Location: PACU  Anesthesia Type: General  Level of Consciousness: awake, oriented, sedated and patient cooperative  Airway & Oxygen Therapy: Patient Spontanous Breathing and Patient connected to face mask oxygen  Post-op Assessment: Report given to PACU RN and Post -op Vital signs reviewed and stable  Post vital signs: Reviewed and stable  Complications: No apparent anesthesia complications

## 2014-12-30 NOTE — Op Note (Signed)
Tony Fox is a 74 y.o.   12/30/2014  General  Preop diagnosis: History of malignant bladder tumor. History of renal stones  Postop diagnosis: Same  Procedure done: Cystoscopy, bladder washings for cytology, left retrograde pyelogram, washings left renal pelvis for cytology, right retrograde pyelogram, washings of right renal pelvis for cytology, bladder biopsy, TUR biopsy prostatic urethra.  Surgeon: Charlene Brooke. Latora Quarry  Anesthesia: Gen.  Indication: Patient is a 75 years old male who had TUR bladder tumor in November 2009 for high-grade TCC. Repeat biopsy in January 2010 showed fragments of residual bladder tumor. A bladder biopsy on 12/30/2008 revealed no residual disease. He was treated with intravesical BCG and has not had any recurrence since. Urine cytology in March 2015 was positive. Cystoscopy with bladder biopsy and washings renal pelves revealed no TCC. Cystoscopy on 11/13/2014 showed no recurrent bladder tumor. However cytology and fish test were again positive. He is now scheduled for cystoscopy, bladder biopsy, retrograde pyelograms and TUR biopsy of the prostatic urethra.  Procedure: The patient was identified by his wrist band and proper timeout was taken.  Under general anesthesia he was prepped and draped and placed in the dorsolithotomy position. A panendoscope was inserted in the bladder. The anterior urethra is normal. He has trilobar prostatic hypertrophy. Examination of the bladder revealed  areas of scarring of previous bladder biopsy and TUR bladder tumor. There is no evidence of tumor in the bladder. The ureteral orifices are in normal position and shape. The bladder was irrigated with normal saline and bladder washings were sent for cytology.  Left retrograde pyelogram:  A sensor wire was passed through a #6 [place] open-ended catheter. The sensor wire and the open-ended catheter were passed and through the cystoscope and the left ureter all the way up to the renal  pelvis. The sensor wire was then removed. The renal pelvis was irrigated with normal saline and washings were sent for cytology. Contrast was then injected through the open-ended catheter. The renal pelvis and calyces appear normal. There is a filling defect in the mid pole calyx that is round and well delineated compatible with a renal stone. They are no other filling defects in the renal pelvis or the calyces nor in the ureter. The open-ended catheter was then removed.  Right retrograde pyelogram:  A new sensor wire was passed through a new #6 [place] open-ended catheter. They were then passed through the cystoscope and the right ureteral orifice and advanced all the way up to the renal pelvis. The sensor wire was removed. The renal pelvis was irrigated with normal saline and washings were also sent for cytology. Contrast was then injected through the open-ended catheter. The renal pelvis and calyces are normal. There are no filling defects in the renal pelvis, the calyces nor the ureter. The open-ended catheter was then removed.  Random bladder biopsy was done with the cold cup forceps. The cystoscope was then removed. A gyrus resectoscope was then inserted in the bladder. There areas of biopsy of the bladder were fulgurated. Then the resection of the prostatic urethra was done with the resectoscope loop. Hemostasis was done with electrocautery. There was no evidence of bleeding at the end of the procedure. The prostatic urethra chips were then irrigated out of the bladder and sent to pathology. The resectoscope was then removed.  A #20 French Foley catheter was then inserted in the bladder.  Patient tolerated the procedure well and left the OR in satisfactory condition to postanesthesia care unit.  EBL:  Minimal  CC: Dr. Wallene Huh

## 2014-12-30 NOTE — Anesthesia Preprocedure Evaluation (Signed)
Anesthesia Evaluation  Patient identified by MRN, date of birth, ID band Patient awake    Reviewed: Allergy & Precautions, H&P , NPO status , Patient's Chart, lab work & pertinent test results  Airway Mallampati: II  TM Distance: >3 FB Neck ROM: Full    Dental no notable dental hx.    Pulmonary sleep apnea and Continuous Positive Airway Pressure Ventilation ,  breath sounds clear to auscultation  Pulmonary exam normal       Cardiovascular hypertension, Pt. on medications Rhythm:Regular Rate:Normal     Neuro/Psych negative neurological ROS  negative psych ROS   GI/Hepatic Neg liver ROS, GERD-  ,  Endo/Other  negative endocrine ROS  Renal/GU negative Renal ROS  negative genitourinary   Musculoskeletal negative musculoskeletal ROS (+)   Abdominal   Peds negative pediatric ROS (+)  Hematology negative hematology ROS (+)   Anesthesia Other Findings   Reproductive/Obstetrics negative OB ROS                             Anesthesia Physical  Anesthesia Plan  ASA: III  Anesthesia Plan: General   Post-op Pain Management:    Induction: Intravenous  Airway Management Planned: LMA  Additional Equipment:   Intra-op Plan:   Post-operative Plan:   Informed Consent: I have reviewed the patients History and Physical, chart, labs and discussed the procedure including the risks, benefits and alternatives for the proposed anesthesia with the patient or authorized representative who has indicated his/her understanding and acceptance.   Dental advisory given  Plan Discussed with: CRNA  Anesthesia Plan Comments:         Anesthesia Quick Evaluation

## 2015-01-01 ENCOUNTER — Encounter (HOSPITAL_BASED_OUTPATIENT_CLINIC_OR_DEPARTMENT_OTHER): Payer: Self-pay | Admitting: Urology

## 2015-07-16 ENCOUNTER — Other Ambulatory Visit (INDEPENDENT_AMBULATORY_CARE_PROVIDER_SITE_OTHER): Payer: Medicare Other

## 2015-07-16 ENCOUNTER — Ambulatory Visit (INDEPENDENT_AMBULATORY_CARE_PROVIDER_SITE_OTHER): Payer: Medicare Other | Admitting: Neurology

## 2015-07-16 ENCOUNTER — Encounter: Payer: Self-pay | Admitting: Neurology

## 2015-07-16 VITALS — BP 148/98 | HR 68 | Resp 16 | Wt 196.0 lb

## 2015-07-16 DIAGNOSIS — R413 Other amnesia: Secondary | ICD-10-CM | POA: Diagnosis not present

## 2015-07-16 DIAGNOSIS — Z8551 Personal history of malignant neoplasm of bladder: Secondary | ICD-10-CM

## 2015-07-16 DIAGNOSIS — I10 Essential (primary) hypertension: Secondary | ICD-10-CM

## 2015-07-16 DIAGNOSIS — E1159 Type 2 diabetes mellitus with other circulatory complications: Secondary | ICD-10-CM | POA: Insufficient documentation

## 2015-07-16 DIAGNOSIS — E119 Type 2 diabetes mellitus without complications: Secondary | ICD-10-CM

## 2015-07-16 DIAGNOSIS — E785 Hyperlipidemia, unspecified: Secondary | ICD-10-CM | POA: Diagnosis not present

## 2015-07-16 LAB — TSH: TSH: 1.71 u[IU]/mL (ref 0.35–4.50)

## 2015-07-16 LAB — VITAMIN B12: VITAMIN B 12: 274 pg/mL (ref 211–911)

## 2015-07-16 NOTE — Progress Notes (Signed)
NEUROLOGY CONSULTATION NOTE  Tony Fox MRN: [id number] DOB: 1941/08/28  Referring Tony Fox: Dr. Wallene Fox Primary care Tony Fox: Dr. Wallene Fox  Reason for consult:  Memory loss  Dear Dr Montez Fox:  Thank you for your kind referral of Tony Fox for consultation of the above symptoms. Although his history is well known to you, please allow me to reiterate it for the purpose of our medical record. The patient was accompanied to the clinic by his wife who also provides collateral information. Records and images were personally reviewed where available.  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 He reports his memory is not what it used to be, he would not recall where he put his keys down, or have a conversation, then when asked a questions later, could not recall what was said previously. He occasionally asks the same questions repeatedly. If his wife calls him to do something and does it right away, there is no problem, however would forget if he does not do it soon after. He has some problems with "follow through," he would get groceries and leave them on the table instead of putting them away. His wife reports that he would be talking and interject something else that was completely not what they were talking about. He avoids driving at night after missing a turn in [place] around a year ago. His wife notices he would sometimes drive on 2 lanes, but does better when there is an audiobook on. He occasionally forgets to take his medicine on time, but overall recalls to take them that day. He denies any missed bill payments. He denies any word-finding difficulties. No difficulties with ADLS. No personality changes.   He denies any significant headaches, dizziness, no diplopia,  dysarthria, dysphagia, neck pain, incontinence/constipation. He has occasional low back pain. He reports his left 5th digit has been numb for the past several years. He has occasional paresthesias in both feet. He has a history of bladder cancer. He denies any anosmia or tremors. He denies any falls, no significant head injuries. His mother was diagnosed with Alzheimer's disease in her 61s, his sister has memory loss. He denies any alcohol use. He exercises 5-6 days a week, which helps with leg swelling.   Laboratory Data: 04/2015 CBC showed a WBC 3.6, Hct 42.8, Plt 148; CMP glucose 101, Na 139, creatinine 1.57, LFTs normal. TSH 1.528.  PAST MEDICAL HISTORY: Past Medical History  Diagnosis Date  . History of bladder cancer   . OSA on CPAP     PER STUDY  05/2007  MODERATE OSA  . Hypertension   . Hyperlipidemia   . Borderline diabetes mellitus   . BPH (benign prostatic hypertrophy)   . GERD (gastroesophageal reflux disease)   . Nocturia   . Arthritis   . DDD (degenerative disc disease), lumbar   . Wears glasses     PAST SURGICAL HISTORY: Past Surgical History  Procedure Laterality Date  . Transurethral resection of bladder tumor  12-30-2008//   11-18-2008//   10-08-2008  . Cystoscopy with biopsy N/A 03/07/2014    Procedure: CYSTOSCOPY WITH Bladder Washings and Bladder BIOPSY;  Surgeon: Tony Bandy, MD;  Location: [facility];  Service: Urology;  Laterality: N/A;  . Transurethral resection of prostate N/A 12/30/2014    Procedure: TRANSURETHRAL RESECTION PROSTATE  BIOPSY;  Surgeon: Tony Persons, MD;  Location[facility];  Service: Urology;  Laterality: N/A;  . Cystoscopy w/ retrogrades Bilateral 12/30/2014    Procedure: CYSTOSCOPY WITH RETROGRADE PYELOGRAM;  Surgeon: Tony Persons, MD;  Location: [facility];  Service: Urology;  Laterality: Bilateral;  . Cystoscopy with biopsy N/A 12/30/2014    Procedure: CYSTOSCOPY WITH BIOPSY;  Surgeon: Tony Persons,  MD;  Location: [facility];  Service: Urology;  Laterality: N/A;    MEDICATIONS: Current Outpatient Prescriptions on File Prior to Visit  Medication Sig Dispense Refill  . amLODipine-valsartan (EXFORGE) 10-320 MG per tablet Take 1 tablet by mouth every morning.     . hydrochlorothiazide (HYDRODIURIL) 25 MG tablet Take 25 mg by mouth every morning.     . metFORMIN (GLUMETZA) 500 MG (MOD) 24 hr tablet Take 500 mg by mouth daily. WITH LUNCH    . naproxen (NAPROSYN) 500 MG tablet Take 500 mg by mouth 2 (two) times daily with a meal.    . rosuvastatin (CRESTOR) 10 MG tablet Take 10 mg by mouth every morning.      No current facility-administered medications on file prior to visit.    ALLERGIES: No Known Allergies  FAMILY HISTORY: Family History  Problem Relation Age of Onset  . Alzheimer's disease Mother   . Stroke Father     SOCIAL HISTORY: Social History   Social History  . Marital Status: Married    Spouse Name: N/A  . Number of Children: 4  . Years of Education: N/A   Occupational History  . Retired    Social History Main Topics  . Smoking status: Never Smoker   . Smokeless tobacco: Never Used  . Alcohol Use: No  . Drug Use: No  . Sexual Activity: Not on file   Other Topics Concern  . Not on file   Social History Narrative    REVIEW OF SYSTEMS: Constitutional: No fevers, chills, or sweats, no generalized fatigue, change in appetite Eyes: No visual changes, double vision, eye pain Ear, nose and throat: No hearing loss, ear pain, nasal congestion, sore throat Cardiovascular: No chest pain, palpitations Respiratory:  No shortness of breath at rest or with exertion, wheezes GastrointestinaI: No nausea, vomiting, diarrhea, abdominal pain, fecal incontinence Genitourinary:  No dysuria, urinary retention or frequency Musculoskeletal:  No neck pain, + occl back pain Integumentary: No rash, pruritus, skin lesions Neurological: as above Psychiatric: No  depression, insomnia, anxiety Endocrine: No palpitations, fatigue, diaphoresis, mood swings, change in appetite, change in weight, increased thirst Hematologic/Lymphatic:  No anemia, purpura, petechiae. Allergic/Immunologic: no itchy/runny eyes, nasal congestion, recent allergic reactions, rashes  PHYSICAL EXAM: Filed Vitals:   07/16/15 0853  BP: 148/98  Pulse: 68  Resp: 16   General: No acute distress Head:  Normocephalic/atraumatic Eyes: Fundoscopic exam shows bilateral sharp discs, no vessel changes, exudates, or hemorrhages Neck: supple, no paraspinal tenderness, full range of motion Back: No paraspinal tenderness Heart: regular rate and rhythm Lungs: Clear to auscultation bilaterally. Vascular: No carotid bruits. Skin/Extremities: No rash, no edema Neurological Exam: Mental status: alert and oriented to person, place, and time, no dysarthria or aphasia, Fund of knowledge is appropriate.  Remote memory intact.  Attention and concentration are normal.    Able to name objects and repeat phrases. Initially had difficulty repeating digits in backward order, but able to do correctly when asked to repeat task [facility] Cognitive Assessment  07/16/2015  Visuospatial/ Executive (0/5) 5  Naming (0/3)  3  Attention: Read list of digits (0/2) 2  Attention: Read list of letters (0/1) 1  Attention: Serial 7 subtraction starting at 100 (0/3) 3  Language: Repeat phrase (0/2) 2  Language : Fluency (0/1) 1  Abstraction (0/2) 2  Delayed Recall (0/5) 2  Orientation (0/6) 6  Total 27  Adjusted Score (based on education) 27   Cranial nerves: CN I: not tested CN II: pupils equal, round and reactive to light, visual fields intact, fundi unremarkable. CN III, IV, VI:  full range of motion, no nystagmus, no ptosis CN V: facial sensation intact CN VII: upper and lower face symmetric CN VIII: hearing intact to finger rub CN IX, X: gag intact, uvula midline CN XI: sternocleidomastoid and trapezius  muscles intact CN XII: tongue midline Bulk & Tone: normal, no cogwheeling, no fasciculations. Motor: 5/5 throughout with no pronator drift. Sensation: decreased cold on left UE, right LE, decreased pin on right LE, decreased pin in stocking distribution to ankle on left, decreased vibration to ankles bilaterally L>R, intact joint position sense. Romberg test slight sway Deep Tendon Reflexes: +2 on both UE, right patella, +1 left patella, absent ankle jerks bilaterally, no ankle clonus Plantar responses: downgoing bilaterally Cerebellar: no incoordination on finger to nose testing. No dysdiadochokinesia Gait: narrow-based and steady, difficulty with tandem walk Tremor: none  IMPRESSION: This is a pleasant 74 year old left-handed man with a history of  hypertension, hyperlipidemia, diabetes, bladder cancer, sleep apnea on CPAP, presenting for worsening memory. Neurological exam shows patchy areas of decreased sensation, as well as evidence of length-dependent neuropathy. MOCA score 27/30 (normal >26/30). By history, symptoms suggestive of mild cognitive impairment. We discussed different causes of memory loss. Check TSH and B12. MRI brain without contrast will be ordered to assess for underlying structural abnormality and assess vascular load. We discussed the option of starting low dose cholinesterase inhibitors such as Aricept, side effects and expectations from the medication were discussed. He would like to hold off for now. We discussed mood and effects on memory, continue to monitor for any signs of depression. We discussed the importance of control of vascular risk factors, physical exercise, and brain stimulation exercises for brain health. He will follow-up in 6 months.   Thank you for allowing me to participate in the care of this patient. Please do not hesitate to call for any questions or concerns.   Ellouise Newer, M.D.  CC: Dr. Montez Fox

## 2015-07-16 NOTE — Patient Instructions (Signed)
1. Bloodwork for TSH, B12 2. Schedule MRI brain without contrast 3. Control of BP, cholesterol, diabetes, as well as physical exercise and brain stimulation exercises (crossword puzzles, reading) are important for brain health 4. Follow-up in 6 months

## 2015-07-23 ENCOUNTER — Ambulatory Visit (HOSPITAL_COMMUNITY)
Admission: RE | Admit: 2015-07-23 | Discharge: 2015-07-23 | Disposition: A | Discharge status: Home / Self Care | Payer: Medicare Other | Source: Ambulatory Visit | Attending: Neurology | Admitting: Neurology

## 2015-07-23 DIAGNOSIS — R413 Other amnesia: Secondary | ICD-10-CM | POA: Insufficient documentation

## 2015-07-23 DIAGNOSIS — G319 Degenerative disease of nervous system, unspecified: Secondary | ICD-10-CM | POA: Diagnosis not present

## 2015-07-24 ENCOUNTER — Telehealth: Payer: Self-pay | Admitting: Family Medicine

## 2015-07-24 NOTE — Telephone Encounter (Signed)
-----   Message from Cameron Sprang, MD sent at 07/24/2015  8:56 AM EDT ----- Pls let patient/wife know that MRI brain did not show evidence of tumor, stroke, or bleed. It showed age-related changes. Thanks

## 2015-07-24 NOTE — Telephone Encounter (Signed)
Lmovm to return my call. 

## 2015-07-27 NOTE — Telephone Encounter (Signed)
Lmovm to return my call. 

## 2015-07-28 NOTE — Telephone Encounter (Signed)
Patients returned your call  Call back number [phone]

## 2015-07-28 NOTE — Telephone Encounter (Signed)
Returned call and spoke with patient's wife/Yvonne and notified her of result.

## 2016-01-14 ENCOUNTER — Ambulatory Visit: Payer: Medicare Other | Admitting: Neurology

## 2016-01-14 DIAGNOSIS — Z029 Encounter for administrative examinations, unspecified: Secondary | ICD-10-CM

## 2016-03-25 ENCOUNTER — Encounter (HOSPITAL_COMMUNITY): Payer: Self-pay | Admitting: Vascular Surgery

## 2016-03-25 ENCOUNTER — Emergency Department (HOSPITAL_COMMUNITY)
Admission: EM | Admit: 2016-03-25 | Discharge: 2016-03-25 | Disposition: A | Discharge status: Home / Self Care | Payer: Medicare Other | Attending: Emergency Medicine | Admitting: Emergency Medicine

## 2016-03-25 DIAGNOSIS — G4733 Obstructive sleep apnea (adult) (pediatric): Secondary | ICD-10-CM | POA: Diagnosis not present

## 2016-03-25 DIAGNOSIS — E785 Hyperlipidemia, unspecified: Secondary | ICD-10-CM | POA: Insufficient documentation

## 2016-03-25 DIAGNOSIS — Z79899 Other long term (current) drug therapy: Secondary | ICD-10-CM | POA: Insufficient documentation

## 2016-03-25 DIAGNOSIS — R200 Anesthesia of skin: Secondary | ICD-10-CM | POA: Insufficient documentation

## 2016-03-25 DIAGNOSIS — Z973 Presence of spectacles and contact lenses: Secondary | ICD-10-CM | POA: Diagnosis not present

## 2016-03-25 DIAGNOSIS — R5381 Other malaise: Secondary | ICD-10-CM | POA: Insufficient documentation

## 2016-03-25 DIAGNOSIS — Z87438 Personal history of other diseases of male genital organs: Secondary | ICD-10-CM | POA: Diagnosis not present

## 2016-03-25 DIAGNOSIS — Z8739 Personal history of other diseases of the musculoskeletal system and connective tissue: Secondary | ICD-10-CM | POA: Diagnosis not present

## 2016-03-25 DIAGNOSIS — Z9981 Dependence on supplemental oxygen: Secondary | ICD-10-CM | POA: Insufficient documentation

## 2016-03-25 DIAGNOSIS — I10 Essential (primary) hypertension: Secondary | ICD-10-CM | POA: Diagnosis not present

## 2016-03-25 DIAGNOSIS — R110 Nausea: Secondary | ICD-10-CM | POA: Insufficient documentation

## 2016-03-25 DIAGNOSIS — Z8719 Personal history of other diseases of the digestive system: Secondary | ICD-10-CM | POA: Diagnosis not present

## 2016-03-25 DIAGNOSIS — Z7984 Long term (current) use of oral hypoglycemic drugs: Secondary | ICD-10-CM | POA: Insufficient documentation

## 2016-03-25 DIAGNOSIS — Z8551 Personal history of malignant neoplasm of bladder: Secondary | ICD-10-CM | POA: Insufficient documentation

## 2016-03-25 DIAGNOSIS — R112 Nausea with vomiting, unspecified: Secondary | ICD-10-CM | POA: Diagnosis present

## 2016-03-25 LAB — I-STAT TROPONIN, ED
Troponin i, poc: 0 ng/mL (ref 0.00–0.08)
Troponin i, poc: 0.01 ng/mL (ref 0.00–0.08)

## 2016-03-25 LAB — COMPREHENSIVE METABOLIC PANEL
ALBUMIN: 4 g/dL (ref 3.5–5.0)
ALT: 24 U/L (ref 17–63)
ANION GAP: 12 (ref 5–15)
AST: 28 U/L (ref 15–41)
Alkaline Phosphatase: 42 U/L (ref 38–126)
BILIRUBIN TOTAL: 1.3 mg/dL — AB (ref 0.3–1.2)
BUN: 19 mg/dL (ref 6–20)
CHLORIDE: 104 mmol/L (ref 101–111)
CO2: 24 mmol/L (ref 22–32)
Calcium: 9.1 mg/dL (ref 8.9–10.3)
Creatinine, Ser: 1.36 mg/dL — ABNORMAL HIGH (ref 0.61–1.24)
GFR calc Af Amer: 57 mL/min — ABNORMAL LOW (ref >60)
GFR calc non Af Amer: 49 mL/min — ABNORMAL LOW (ref >60)
GLUCOSE: 136 mg/dL — AB (ref 65–99)
POTASSIUM: 3.7 mmol/L (ref 3.5–5.1)
SODIUM: 140 mmol/L (ref 135–145)
TOTAL PROTEIN: 6.9 g/dL (ref 6.5–8.1)

## 2016-03-25 LAB — DIFFERENTIAL
BASOS PCT: 0 %
Basophils Absolute: 0 10*3/uL (ref 0.0–0.1)
EOS PCT: 1 %
Eosinophils Absolute: 0 10*3/uL (ref 0.0–0.7)
LYMPHS PCT: 31 %
Lymphs Abs: 1.6 10*3/uL (ref 0.7–4.0)
Monocytes Absolute: 0.3 10*3/uL (ref 0.1–1.0)
Monocytes Relative: 6 %
NEUTROS ABS: 3.2 10*3/uL (ref 1.7–7.7)
NEUTROS PCT: 62 %

## 2016-03-25 LAB — URINALYSIS, ROUTINE W REFLEX MICROSCOPIC
Bilirubin Urine: NEGATIVE
Glucose, UA: NEGATIVE mg/dL
KETONES UR: 15 mg/dL — AB
LEUKOCYTES UA: NEGATIVE
NITRITE: NEGATIVE
PH: 7 (ref 5.0–8.0)
PROTEIN: NEGATIVE mg/dL
Specific Gravity, Urine: 1.015 (ref 1.005–1.030)

## 2016-03-25 LAB — I-STAT CHEM 8, ED
BUN: 21 mg/dL — AB (ref 6–20)
Calcium, Ion: 1.13 mmol/L (ref 1.13–1.30)
Chloride: 99 mmol/L — ABNORMAL LOW (ref 101–111)
Creatinine, Ser: 1.4 mg/dL — ABNORMAL HIGH (ref 0.61–1.24)
Glucose, Bld: 130 mg/dL — ABNORMAL HIGH (ref 65–99)
HEMATOCRIT: 45 % (ref 39.0–52.0)
Hemoglobin: 15.3 g/dL (ref 13.0–17.0)
POTASSIUM: 3.6 mmol/L (ref 3.5–5.1)
SODIUM: 138 mmol/L (ref 135–145)
TCO2: 26 mmol/L (ref 0–100)

## 2016-03-25 LAB — CBC
HCT: 40.9 % (ref 39.0–52.0)
Hemoglobin: 13.7 g/dL (ref 13.0–17.0)
MCH: 30.2 pg (ref 26.0–34.0)
MCHC: 33.5 g/dL (ref 30.0–36.0)
MCV: 90.3 fL (ref 78.0–100.0)
PLATELETS: 143 10*3/uL — AB (ref 150–400)
RBC: 4.53 MIL/uL (ref 4.22–5.81)
RDW: 12.6 % (ref 11.5–15.5)
WBC: 5.2 10*3/uL (ref 4.0–10.5)

## 2016-03-25 LAB — PROTIME-INR
INR: 1.06 (ref 0.00–1.49)
PROTHROMBIN TIME: 14 s (ref 11.6–15.2)

## 2016-03-25 LAB — APTT: aPTT: 27 seconds (ref 24–37)

## 2016-03-25 LAB — URINE MICROSCOPIC-ADD ON

## 2016-03-25 MED ORDER — ONDANSETRON 4 MG PO TBDP
ORAL_TABLET | ORAL | Status: AC
  Filled 2016-03-25: qty 1

## 2016-03-25 MED ORDER — ONDANSETRON HCL 4 MG PO TABS: 4.0000 mg | ORAL_TABLET | Freq: Three times a day (TID) | ORAL | Status: DC | PRN

## 2016-03-25 MED ORDER — ONDANSETRON 4 MG PO TBDP
4.0000 mg | ORAL_TABLET | Freq: Once | ORAL | Status: AC
  Administered 2016-03-25: 4 mg via ORAL

## 2016-03-25 NOTE — Discharge Instructions (Signed)
Nausea, Adult °Nausea is the feeling that you have an upset stomach or have to vomit. Nausea by itself is not likely a serious concern, but it may be an early sign of more serious medical problems. As nausea gets worse, it can lead to vomiting. If vomiting develops, there is the risk of dehydration.  °CAUSES  °· Viral infections. °· Food poisoning. °· Medicines. °· Pregnancy. °· Motion sickness. °· Migraine headaches. °· Emotional distress. °· Severe pain from any source. °· Alcohol intoxication. °HOME CARE INSTRUCTIONS °· Get plenty of rest. °· Ask your caregiver about specific rehydration instructions. °· Eat small amounts of food and sip liquids more often. °· Take all medicines as told by your caregiver. °SEEK MEDICAL CARE IF: °· You have not improved after 2 days, or you get worse. °· You have a headache. °SEEK IMMEDIATE MEDICAL CARE IF:  °· You have a fever. °· You faint. °· You keep vomiting or have blood in your vomit. °· You are extremely weak or dehydrated. °· You have dark or bloody stools. °· You have severe chest or abdominal pain. °MAKE SURE YOU: °· Understand these instructions. °· Will watch your condition. °· Will get help right away if you are not doing well or get worse. °  °This information is not intended to replace advice given to you by your health care provider. Make sure you discuss any questions you have with your health care provider. °  °Document Released: 12/08/2004 Document Revised: 11/21/2014 Document Reviewed: 07/13/2011 °Elsevier Interactive Patient Education ©2016 [employer] ° °

## 2016-03-25 NOTE — ED Provider Notes (Signed)
CSN: [id number]     Arrival date & time 03/25/16  1411 History   First MD Initiated Contact with Patient 03/25/16 1546     Chief Complaint  Patient presents with  . Hypertension  . Numbness     Patient is a 75 y.o. male presenting with hypertension. The history is provided by the patient. No language interpreter was used.  Hypertension   Tony Fox is a 75 y.o. male who presents to the Emergency Department complaining of HTN, nausea.  He was at home and earlier today developed nausea. His home health nurse checked his blood pressure was noted to be elevated at 150/80 and a fingerstick blood sugar was noted to be elevated at 128. He had associated general malaise, nausea, mild central chest discomfort. He has vomited twice since his symptoms began. Currently his nausea and chest discomfort are resolving. Denies any fevers, cough, shortness of breath, abdominal pain, diarrhea, leg swelling or pain. He does have diabetes and has chronic intermittent numbness in his toes and a light feeling in his legs when he walks. He notices a little bit more earlier today but now this is improving.  Past Medical History  Diagnosis Date  . History of bladder cancer   . OSA on CPAP     PER STUDY  05/2007  MODERATE OSA  . Hypertension   . Hyperlipidemia   . Borderline diabetes mellitus   . BPH (benign prostatic hypertrophy)   . GERD (gastroesophageal reflux disease)   . Nocturia   . Arthritis   . DDD (degenerative disc disease), lumbar   . Wears glasses    Past Surgical History  Procedure Laterality Date  . Transurethral resection of bladder tumor  12-30-2008//   11-18-2008//   10-08-2008  . Cystoscopy with biopsy N/A 03/07/2014    Procedure: CYSTOSCOPY WITH Bladder Washings and Bladder BIOPSY;  Surgeon: Lowella Bandy, MD;  Location: [facility];  Service: Urology;  Laterality: N/A;  . Transurethral resection of prostate N/A 12/30/2014    Procedure: TRANSURETHRAL RESECTION PROSTATE  BIOPSY;  Surgeon: Arvil Persons, MD;  Location: [facility];  Service: Urology;  Laterality: N/A;  . Cystoscopy w/ retrogrades Bilateral 12/30/2014    Procedure: CYSTOSCOPY WITH RETROGRADE PYELOGRAM;  Surgeon: Arvil Persons, MD;  Location: [facility];  Service: Urology;  Laterality: Bilateral;  . Cystoscopy with biopsy N/A 12/30/2014    Procedure: CYSTOSCOPY WITH BIOPSY;  Surgeon: Arvil Persons, MD;  Location: [facility];  Service: Urology;  Laterality: N/A;   Family History  Problem Relation Age of Onset  . Alzheimer's disease Mother   . Stroke Father    Social History  Substance Use Topics  . Smoking status: Never Smoker   . Smokeless tobacco: Never Used  . Alcohol Use: No    Review of Systems  All other systems reviewed and are negative.     Allergies  Review of patient's allergies indicates no known allergies.  Home Medications   Prior to Admission medications   Medication Sig Start Date End Date Taking? Authorizing Provider  amLODipine-valsartan (EXFORGE) 10-320 MG per tablet Take 1 tablet by mouth every morning.    Yes Historical Provider, MD  donepezil (ARICEPT) 5 MG tablet Take 5 mg by mouth 2 (two) times daily.   Yes Historical Provider, MD  hydrochlorothiazide (HYDRODIURIL) 25 MG tablet Take 25 mg by mouth every morning.    Yes Historical Provider, MD  metFORMIN (GLUCOPHAGE) 500 MG tablet  Take 500 mg by mouth daily with breakfast.   Yes Historical Provider, MD  rosuvastatin (CRESTOR) 10 MG tablet Take 10 mg by mouth every morning.    Yes Historical Provider, MD  ondansetron (ZOFRAN) 4 MG tablet Take 1 tablet (4 mg total) by mouth every 8 (eight) hours as needed for nausea or vomiting. 03/25/16   Tony Reichert, MD   BP 130/80 mmHg  Pulse 76  Temp(Src) 97.6 F (36.4 C) (Oral)  Resp 19  SpO2 99% Physical Exam  Constitutional: He is oriented to person, place, and time. He appears well-developed and well-nourished.  HENT:   Head: Normocephalic and atraumatic.  Cardiovascular: Normal rate and regular rhythm.   No murmur heard. Pulmonary/Chest: Effort normal and breath sounds normal. No respiratory distress.  Abdominal: Soft. There is no tenderness. There is no rebound and no guarding.  Musculoskeletal: He exhibits no edema or tenderness.  Neurological: He is alert and oriented to person, place, and time.  Skin: Skin is warm and dry.  Psychiatric: He has a normal mood and affect. His behavior is normal.  Nursing note and vitals reviewed.   ED Course  Procedures (including critical care time) Labs Review Labs Reviewed  CBC - Abnormal; Notable for the following:    Platelets 143 (*)    All other components within normal limits  COMPREHENSIVE METABOLIC PANEL - Abnormal; Notable for the following:    Glucose, Bld 136 (*)    Creatinine, Ser 1.36 (*)    Total Bilirubin 1.3 (*)    GFR calc non Af Amer 49 (*)    GFR calc Af Amer 57 (*)    All other components within normal limits  URINALYSIS, ROUTINE W REFLEX MICROSCOPIC (NOT AT [facility]) - Abnormal; Notable for the following:    Hgb urine dipstick MODERATE (*)    Ketones, ur 15 (*)    All other components within normal limits  URINE MICROSCOPIC-ADD ON - Abnormal; Notable for the following:    Squamous Epithelial / LPF 0-5 (*)    Bacteria, UA RARE (*)    All other components within normal limits  I-STAT CHEM 8, ED - Abnormal; Notable for the following:    Chloride 99 (*)    BUN 21 (*)    Creatinine, Ser 1.40 (*)    Glucose, Bld 130 (*)    All other components within normal limits  PROTIME-INR  APTT  DIFFERENTIAL  I-STAT TROPOININ, ED  I-STAT TROPOININ, ED    Imaging Review No results found. I have personally reviewed and evaluated these images and lab results as part of my medical decision-making.   EKG Interpretation   Date/Time:  Friday Mar 25 2016 14:27:49 EDT Ventricular Rate:  64 PR Interval:  164 QRS Duration: 88 QT Interval:   452 QTC Calculation: 466 R Axis:   -13 Text Interpretation:  Normal sinus rhythm Possible Anterior infarct , age  undetermined Abnormal ECG Confirmed by Hazle Coca [phone]) on 03/25/2016  3:47:02 PM      MDM   Final diagnoses:  Nausea    Pt here for nausea and elevated BP prior to ED.  Nausea and HTN resolved in the ED.  Presentation not c/w ACS, bowel obstruction, CVA.  Plan to d/c home with close outpatient follow up.  Return precautions discussed.      Tony Reichert, MD 03/25/16 2325

## 2016-03-25 NOTE — ED Notes (Signed)
Ordered the soup of the day, per Georga Hacking.

## 2016-03-25 NOTE — ED Notes (Signed)
Spoke with Dr. Christy Gentles about the toe numbness. No code stroke to be called in light of bilateral presence and lack of other acutely developed focal symptoms.

## 2016-03-25 NOTE — ED Notes (Signed)
Pt is actively vomiting

## 2016-03-25 NOTE — ED Notes (Addendum)
Pt reports to the ED for eval of HTN. The home health nurse came out to see him and she took his BP and noted it to be high. Pt also had some abd pain and nausea. Denies any CP, HA, SOB, or vision changes. Pt also has diabetes and is having numbness in his toes and gait disturbance. The toe numbness started this am and is felt bilaterally. States he had had the gait disturbance and altered balance x several months. Pt A&Ox4, resp e/u, and skin warm and dry.

## 2016-10-11 ENCOUNTER — Other Ambulatory Visit: Payer: Self-pay | Admitting: Cardiology

## 2016-10-11 DIAGNOSIS — R1084 Generalized abdominal pain: Secondary | ICD-10-CM

## 2016-10-14 ENCOUNTER — Inpatient Hospital Stay
Admission: RE | Admit: 2016-10-14 | Discharge: 2016-10-14 | Disposition: A | Discharge status: Home / Self Care | Payer: Medicare Other | Source: Ambulatory Visit | Attending: Cardiology | Admitting: Cardiology

## 2017-04-13 ENCOUNTER — Other Ambulatory Visit: Payer: Self-pay | Admitting: Internal Medicine

## 2017-04-13 ENCOUNTER — Other Ambulatory Visit: Payer: Medicare Other

## 2017-04-13 DIAGNOSIS — R600 Localized edema: Secondary | ICD-10-CM

## 2017-04-19 ENCOUNTER — Inpatient Hospital Stay
Admission: RE | Admit: 2017-04-19 | Discharge: 2017-04-19 | Disposition: A | Discharge status: Home / Self Care | Payer: Medicare Other | Source: Ambulatory Visit | Attending: Internal Medicine | Admitting: Internal Medicine

## 2019-11-24 ENCOUNTER — Encounter (HOSPITAL_COMMUNITY): Payer: Self-pay | Admitting: *Deleted

## 2019-11-24 ENCOUNTER — Emergency Department (HOSPITAL_COMMUNITY): Payer: Medicare PPO

## 2019-11-24 ENCOUNTER — Inpatient Hospital Stay (HOSPITAL_COMMUNITY)
Admission: EM | Admit: 2019-11-24 | Discharge: 2019-11-30 | DRG: 683 | Disposition: A | Discharge status: Skilled Nursing Facility | Payer: Medicare PPO | Attending: Internal Medicine | Admitting: Internal Medicine

## 2019-11-24 DIAGNOSIS — N179 Acute kidney failure, unspecified: Principal | ICD-10-CM | POA: Diagnosis present

## 2019-11-24 DIAGNOSIS — Z79899 Other long term (current) drug therapy: Secondary | ICD-10-CM | POA: Diagnosis not present

## 2019-11-24 DIAGNOSIS — G4733 Obstructive sleep apnea (adult) (pediatric): Secondary | ICD-10-CM | POA: Diagnosis present

## 2019-11-24 DIAGNOSIS — Z7982 Long term (current) use of aspirin: Secondary | ICD-10-CM

## 2019-11-24 DIAGNOSIS — N400 Enlarged prostate without lower urinary tract symptoms: Secondary | ICD-10-CM | POA: Diagnosis present

## 2019-11-24 DIAGNOSIS — E1122 Type 2 diabetes mellitus with diabetic chronic kidney disease: Secondary | ICD-10-CM | POA: Diagnosis present

## 2019-11-24 DIAGNOSIS — R7989 Other specified abnormal findings of blood chemistry: Secondary | ICD-10-CM | POA: Diagnosis present

## 2019-11-24 DIAGNOSIS — K219 Gastro-esophageal reflux disease without esophagitis: Secondary | ICD-10-CM | POA: Diagnosis present

## 2019-11-24 DIAGNOSIS — R531 Weakness: Secondary | ICD-10-CM | POA: Diagnosis not present

## 2019-11-24 DIAGNOSIS — N200 Calculus of kidney: Secondary | ICD-10-CM | POA: Diagnosis present

## 2019-11-24 DIAGNOSIS — R32 Unspecified urinary incontinence: Secondary | ICD-10-CM | POA: Diagnosis present

## 2019-11-24 DIAGNOSIS — R778 Other specified abnormalities of plasma proteins: Secondary | ICD-10-CM | POA: Diagnosis present

## 2019-11-24 DIAGNOSIS — N1831 Chronic kidney disease, stage 3a: Secondary | ICD-10-CM | POA: Diagnosis present

## 2019-11-24 DIAGNOSIS — Z20822 Contact with and (suspected) exposure to covid-19: Secondary | ICD-10-CM | POA: Diagnosis present

## 2019-11-24 DIAGNOSIS — F05 Delirium due to known physiological condition: Secondary | ICD-10-CM | POA: Diagnosis present

## 2019-11-24 DIAGNOSIS — E785 Hyperlipidemia, unspecified: Secondary | ICD-10-CM | POA: Diagnosis present

## 2019-11-24 DIAGNOSIS — R0600 Dyspnea, unspecified: Secondary | ICD-10-CM

## 2019-11-24 DIAGNOSIS — Z66 Do not resuscitate: Secondary | ICD-10-CM | POA: Diagnosis present

## 2019-11-24 DIAGNOSIS — I82441 Acute embolism and thrombosis of right tibial vein: Secondary | ICD-10-CM | POA: Diagnosis present

## 2019-11-24 DIAGNOSIS — R413 Other amnesia: Secondary | ICD-10-CM | POA: Diagnosis present

## 2019-11-24 DIAGNOSIS — F0390 Unspecified dementia without behavioral disturbance: Secondary | ICD-10-CM | POA: Diagnosis present

## 2019-11-24 DIAGNOSIS — E1165 Type 2 diabetes mellitus with hyperglycemia: Secondary | ICD-10-CM | POA: Diagnosis present

## 2019-11-24 DIAGNOSIS — R0789 Other chest pain: Secondary | ICD-10-CM | POA: Diagnosis present

## 2019-11-24 DIAGNOSIS — Z7984 Long term (current) use of oral hypoglycemic drugs: Secondary | ICD-10-CM | POA: Diagnosis not present

## 2019-11-24 DIAGNOSIS — M5136 Other intervertebral disc degeneration, lumbar region: Secondary | ICD-10-CM | POA: Diagnosis present

## 2019-11-24 DIAGNOSIS — E119 Type 2 diabetes mellitus without complications: Secondary | ICD-10-CM

## 2019-11-24 DIAGNOSIS — I361 Nonrheumatic tricuspid (valve) insufficiency: Secondary | ICD-10-CM | POA: Diagnosis not present

## 2019-11-24 DIAGNOSIS — E872 Acidosis, unspecified: Secondary | ICD-10-CM | POA: Diagnosis present

## 2019-11-24 DIAGNOSIS — Z8551 Personal history of malignant neoplasm of bladder: Secondary | ICD-10-CM

## 2019-11-24 DIAGNOSIS — I129 Hypertensive chronic kidney disease with stage 1 through stage 4 chronic kidney disease, or unspecified chronic kidney disease: Secondary | ICD-10-CM | POA: Diagnosis present

## 2019-11-24 DIAGNOSIS — I371 Nonrheumatic pulmonary valve insufficiency: Secondary | ICD-10-CM | POA: Diagnosis not present

## 2019-11-24 DIAGNOSIS — E1159 Type 2 diabetes mellitus with other circulatory complications: Secondary | ICD-10-CM | POA: Diagnosis present

## 2019-11-24 DIAGNOSIS — R079 Chest pain, unspecified: Secondary | ICD-10-CM

## 2019-11-24 DIAGNOSIS — D696 Thrombocytopenia, unspecified: Secondary | ICD-10-CM | POA: Diagnosis present

## 2019-11-24 LAB — PROTIME-INR
INR: 1.1 (ref 0.8–1.2)
Prothrombin Time: 13.7 seconds (ref 11.4–15.2)

## 2019-11-24 LAB — URINALYSIS, ROUTINE W REFLEX MICROSCOPIC
Bilirubin Urine: NEGATIVE
Glucose, UA: NEGATIVE mg/dL
Hgb urine dipstick: NEGATIVE
Ketones, ur: NEGATIVE mg/dL
Nitrite: NEGATIVE
Protein, ur: NEGATIVE mg/dL
Specific Gravity, Urine: 1.01 (ref 1.005–1.030)
pH: 5 (ref 5.0–8.0)

## 2019-11-24 LAB — CBC WITH DIFFERENTIAL/PLATELET
Abs Immature Granulocytes: 0.02 10*3/uL (ref 0.00–0.07)
Basophils Absolute: 0 10*3/uL (ref 0.0–0.1)
Basophils Relative: 0 %
Eosinophils Absolute: 0.1 10*3/uL (ref 0.0–0.5)
Eosinophils Relative: 1 %
HCT: 50 % (ref 39.0–52.0)
Hemoglobin: 16.3 g/dL (ref 13.0–17.0)
Immature Granulocytes: 0 %
Lymphocytes Relative: 23 %
Lymphs Abs: 1.7 10*3/uL (ref 0.7–4.0)
MCH: 29.2 pg (ref 26.0–34.0)
MCHC: 32.6 g/dL (ref 30.0–36.0)
MCV: 89.4 fL (ref 80.0–100.0)
Monocytes Absolute: 0.6 10*3/uL (ref 0.1–1.0)
Monocytes Relative: 8 %
Neutro Abs: 5.2 10*3/uL (ref 1.7–7.7)
Neutrophils Relative %: 68 %
Platelets: 141 10*3/uL — ABNORMAL LOW (ref 150–400)
RBC: 5.59 MIL/uL (ref 4.22–5.81)
RDW: 12.6 % (ref 11.5–15.5)
WBC: 7.6 10*3/uL (ref 4.0–10.5)
nRBC: 0 % (ref 0.0–0.2)

## 2019-11-24 LAB — COMPREHENSIVE METABOLIC PANEL
ALT: 27 U/L (ref 0–44)
AST: 46 U/L — ABNORMAL HIGH (ref 15–41)
Albumin: 4.2 g/dL (ref 3.5–5.0)
Alkaline Phosphatase: 48 U/L (ref 38–126)
Anion gap: 15 (ref 5–15)
BUN: 49 mg/dL — ABNORMAL HIGH (ref 8–23)
CO2: 22 mmol/L (ref 22–32)
Calcium: 9.6 mg/dL (ref 8.9–10.3)
Chloride: 104 mmol/L (ref 98–111)
Creatinine, Ser: 2.95 mg/dL — ABNORMAL HIGH (ref 0.61–1.24)
GFR calc Af Amer: 22 mL/min — ABNORMAL LOW (ref >60)
GFR calc non Af Amer: 19 mL/min — ABNORMAL LOW (ref >60)
Glucose, Bld: 114 mg/dL — ABNORMAL HIGH (ref 70–99)
Potassium: 3.9 mmol/L (ref 3.5–5.1)
Sodium: 141 mmol/L (ref 135–145)
Total Bilirubin: 1.2 mg/dL (ref 0.3–1.2)
Total Protein: 8 g/dL (ref 6.5–8.1)

## 2019-11-24 LAB — RESPIRATORY PANEL BY RT PCR (FLU A&B, COVID)
Influenza A by PCR: NEGATIVE
Influenza B by PCR: NEGATIVE
SARS Coronavirus 2 by RT PCR: NEGATIVE

## 2019-11-24 LAB — APTT: aPTT: 31 seconds (ref 24–36)

## 2019-11-24 LAB — BRAIN NATRIURETIC PEPTIDE: B Natriuretic Peptide: 21.7 pg/mL (ref 0.0–100.0)

## 2019-11-24 LAB — CK: Total CK: 1144 U/L — ABNORMAL HIGH (ref 49–397)

## 2019-11-24 LAB — LACTIC ACID, PLASMA: Lactic Acid, Venous: 2.5 mmol/L (ref 0.5–1.9)

## 2019-11-24 LAB — D-DIMER, QUANTITATIVE: D-Dimer, Quant: 17.2 ug/mL-FEU — ABNORMAL HIGH (ref 0.00–0.50)

## 2019-11-24 LAB — TROPONIN I (HIGH SENSITIVITY)
Troponin I (High Sensitivity): 2126 ng/L (ref <18)
Troponin I (High Sensitivity): 1642 ng/L (ref <18)

## 2019-11-24 LAB — GLUCOSE, CAPILLARY: Glucose-Capillary: 102 mg/dL — ABNORMAL HIGH (ref 70–99)

## 2019-11-24 MED ORDER — LACTATED RINGERS IV BOLUS
1000.0000 mL | Freq: Once | INTRAVENOUS | Status: AC
  Administered 2019-11-24: 17:00:00 1000 mL via INTRAVENOUS

## 2019-11-24 MED ORDER — MAGNESIUM CITRATE PO SOLN: 1.0000 | Freq: Once | ORAL | Status: DC | PRN

## 2019-11-24 MED ORDER — SODIUM CHLORIDE 0.9 % IV SOLN: INTRAVENOUS | Status: AC

## 2019-11-24 MED ORDER — ONDANSETRON HCL 4 MG/2ML IJ SOLN: 4.0000 mg | Freq: Four times a day (QID) | INTRAMUSCULAR | Status: DC | PRN

## 2019-11-24 MED ORDER — HEPARIN (PORCINE) 25000 UT/250ML-% IV SOLN
900.0000 [IU]/h | INTRAVENOUS | Status: DC
  Administered 2019-11-24: 20:00:00 1350 [IU]/h via INTRAVENOUS
  Filled 2019-11-24 (×2): qty 250

## 2019-11-24 MED ORDER — ATORVASTATIN CALCIUM 10 MG PO TABS
20.0000 mg | ORAL_TABLET | Freq: Every day | ORAL | Status: DC
  Administered 2019-11-25 – 2019-11-30 (×6): 20 mg via ORAL
  Filled 2019-11-24 (×7): qty 2

## 2019-11-24 MED ORDER — ASPIRIN EC 81 MG PO TBEC: 81.0000 mg | DELAYED_RELEASE_TABLET | Freq: Every day | ORAL | Status: DC

## 2019-11-24 MED ORDER — ASPIRIN EC 81 MG PO TBEC
81.0000 mg | DELAYED_RELEASE_TABLET | Freq: Every day | ORAL | Status: DC
  Administered 2019-11-25 – 2019-11-29 (×5): 81 mg via ORAL
  Filled 2019-11-24 (×5): qty 1

## 2019-11-24 MED ORDER — DONEPEZIL HCL 5 MG PO TABS
5.0000 mg | ORAL_TABLET | Freq: Two times a day (BID) | ORAL | Status: DC
  Administered 2019-11-25 – 2019-11-30 (×12): 5 mg via ORAL
  Filled 2019-11-24 (×12): qty 1

## 2019-11-24 MED ORDER — ASCORBIC ACID 500 MG PO TABS
500.0000 mg | ORAL_TABLET | Freq: Every day | ORAL | Status: DC
  Administered 2019-11-25 – 2019-11-30 (×6): 500 mg via ORAL
  Filled 2019-11-24 (×6): qty 1

## 2019-11-24 MED ORDER — HYDRALAZINE HCL 25 MG PO TABS: 25.0000 mg | ORAL_TABLET | Freq: Four times a day (QID) | ORAL | Status: DC | PRN

## 2019-11-24 MED ORDER — ACETAMINOPHEN 650 MG RE SUPP: 650.0000 mg | Freq: Four times a day (QID) | RECTAL | Status: DC | PRN

## 2019-11-24 MED ORDER — POLYETHYLENE GLYCOL 3350 17 G PO PACK: 17.0000 g | PACK | Freq: Every day | ORAL | Status: DC | PRN

## 2019-11-24 MED ORDER — ONDANSETRON HCL 4 MG PO TABS: 4.0000 mg | ORAL_TABLET | Freq: Four times a day (QID) | ORAL | Status: DC | PRN

## 2019-11-24 MED ORDER — INSULIN ASPART 100 UNIT/ML ~~LOC~~ SOLN
0.0000 [IU] | Freq: Three times a day (TID) | SUBCUTANEOUS | Status: DC
  Administered 2019-11-25 – 2019-11-26 (×4): 1 [IU] via SUBCUTANEOUS
  Administered 2019-11-27: 2 [IU] via SUBCUTANEOUS
  Administered 2019-11-27: 08:00:00 1 [IU] via SUBCUTANEOUS
  Administered 2019-11-28: 2 [IU] via SUBCUTANEOUS
  Administered 2019-11-29 – 2019-11-30 (×3): 1 [IU] via SUBCUTANEOUS
  Administered 2019-11-30: 2 [IU] via SUBCUTANEOUS

## 2019-11-24 MED ORDER — BISACODYL 5 MG PO TBEC: 5.0000 mg | DELAYED_RELEASE_TABLET | Freq: Every day | ORAL | Status: DC | PRN

## 2019-11-24 MED ORDER — ACETAMINOPHEN 325 MG PO TABS
650.0000 mg | ORAL_TABLET | Freq: Four times a day (QID) | ORAL | Status: DC | PRN
  Administered 2019-11-26 – 2019-11-29 (×2): 650 mg via ORAL
  Filled 2019-11-24 (×2): qty 2

## 2019-11-24 MED ORDER — HEPARIN BOLUS VIA INFUSION
3000.0000 [IU] | Freq: Once | INTRAVENOUS | Status: AC
  Administered 2019-11-24: 20:00:00 3000 [IU] via INTRAVENOUS
  Filled 2019-11-24: qty 3000

## 2019-11-24 NOTE — ED Provider Notes (Signed)
[facility] DEPT Provider Note   CSN: [id number] Arrival date & time: 11/24/19  1335     History Chief Complaint  Patient presents with  . Back Pain  . Altered Mental Status    Tony Fox is a 79 y.o. male.  Patient is an 79 year old male with a history of newly diagnosed diabetes, hypertension, hyperlipidemia, prior bladder cancer that is in remission who is presenting today by EMS due to generalized weakness.  Wife gives most of the history and states for the last 3 to 4 days he has not been quite himself.  He is having more difficulty getting around and in the last 2 days patient has been unable to get out of bed.  His son had to get him out of bed yesterday and put him in the shower to clean him off and he may have had a syncopal event while in the shower and they had to drag him back to the bed.  He has had no trauma but wife does state he has been complaining of back pain.  Wife denies any change in urinary habits and no diarrhea.  She has noticed he seems winded when getting up but had recently had medication changes approximately 2 weeks ago due to swelling in his legs he was started on a diuretic and his hydrochlorothiazide in cholesterol medication was discontinued.  In the last few days his appetite has decreased but he is still eating breakfast.  He has not had cough or report of abdominal pain.  On exam here patient denies back pain and states that he has pressure in his chest on the right side.  He cannot give any further details about this and when asking his wife he has not mentioned any chest pain at home.  No known Covid contacts in the last few weeks.  The history is provided by the patient and the spouse.  Back Pain Altered Mental Status      Past Medical History:  Diagnosis Date  . Arthritis   . Borderline diabetes mellitus   . BPH (benign prostatic hypertrophy)   . DDD (degenerative disc disease), lumbar   . GERD  (gastroesophageal reflux disease)   . History of bladder cancer   . Hyperlipidemia   . Hypertension   . Nocturia   . OSA on CPAP    PER STUDY  05/2007  MODERATE OSA  . Wears glasses     Patient Active Problem List   Diagnosis Date Noted  . Memory loss 07/16/2015  . History of bladder cancer 07/16/2015  . Essential hypertension 07/16/2015  . Hyperlipidemia 07/16/2015  . Uncomplicated type 2 diabetes mellitus ([place]) 07/16/2015    Past Surgical History:  Procedure Laterality Date  . CYSTOSCOPY W/ RETROGRADES Bilateral 12/30/2014   Procedure: CYSTOSCOPY WITH RETROGRADE PYELOGRAM;  Surgeon: Arvil Persons, MD;  Location: [facility];  Service: Urology;  Laterality: Bilateral;  . CYSTOSCOPY WITH BIOPSY N/A 03/07/2014   Procedure: CYSTOSCOPY WITH Bladder Washings and Bladder BIOPSY;  Surgeon: Lowella Bandy, MD;  Location: [facility];  Service: Urology;  Laterality: N/A;  . CYSTOSCOPY WITH BIOPSY N/A 12/30/2014   Procedure: CYSTOSCOPY WITH BIOPSY;  Surgeon: Arvil Persons, MD;  Location: [facility];  Service: Urology;  Laterality: N/A;  . TRANSURETHRAL RESECTION OF BLADDER TUMOR  12-30-2008//   11-18-2008//   10-08-2008  . TRANSURETHRAL RESECTION OF PROSTATE N/A 12/30/2014   Procedure: TRANSURETHRAL RESECTION PROSTATE BIOPSY;  Surgeon: Altamese Dilling  Frutoso Chase, MD;  Location: [facility];  Service: Urology;  Laterality: N/A;       Family History  Problem Relation Age of Onset  . Alzheimer's disease Mother   . Stroke Father     Social History   Tobacco Use  . Smoking status: Never Smoker  . Smokeless tobacco: Never Used  Substance Use Topics  . Alcohol use: No    Alcohol/week: 0.0 standard drinks  . Drug use: No    Home Medications Prior to Admission medications   Medication Sig Start Date End Date Taking? Authorizing Provider  amLODipine-valsartan (EXFORGE) 10-320 MG per tablet Take 1 tablet by mouth every morning.     [provider]  donepezil (ARICEPT) 5 MG tablet Take 5 mg by mouth 2 (two) times daily.    [provider]  hydrochlorothiazide (HYDRODIURIL) 25 MG tablet Take 25 mg by mouth every morning.     [provider]  metFORMIN (GLUCOPHAGE) 500 MG tablet Take 500 mg by mouth daily with breakfast.    [provider]  ondansetron (ZOFRAN) 4 MG tablet Take 1 tablet (4 mg total) by mouth every 8 (eight) hours as needed for nausea or vomiting. 03/25/16   Quintella Reichert, MD  rosuvastatin (CRESTOR) 10 MG tablet Take 10 mg by mouth every morning.     [provider]    Allergies    Patient has no known allergies.  Review of Systems   Review of Systems  Musculoskeletal: Positive for back pain.  All other systems reviewed and are negative.   Physical Exam Updated Vital Signs BP 102/69 (BP Location: Right Arm)   Pulse 98   Temp 98.8 F (37.1 C) (Oral)   Resp 18   SpO2 92%   Physical Exam Vitals and nursing note reviewed.  Constitutional:      General: He is not in acute distress.    Appearance: Normal appearance. He is well-developed and normal weight.  HENT:     Head: Normocephalic and atraumatic.     Mouth/Throat:     Mouth: Mucous membranes are moist.  Eyes:     Conjunctiva/sclera: Conjunctivae normal.     Pupils: Pupils are equal, round, and reactive to light.  Cardiovascular:     Rate and Rhythm: Regular rhythm. Tachycardia present.     Pulses: Normal pulses.     Heart sounds: No murmur.  Pulmonary:     Effort: Pulmonary effort is normal. No respiratory distress.     Breath sounds: Normal breath sounds. No wheezing or rales.  Abdominal:     General: There is no distension.     Palpations: Abdomen is soft.     Tenderness: There is no abdominal tenderness. There is no right CVA tenderness, left CVA tenderness, guarding or rebound.  Musculoskeletal:        General: No tenderness. Normal range of motion.     Cervical back: Normal range of motion  and neck supple.     Right lower leg: No edema.     Left lower leg: No edema.     Comments: No midline thoracic or lumbar spinal tenderness  Skin:    General: Skin is warm and dry.     Findings: No erythema or rash.  Neurological:     General: No focal deficit present.     Mental Status: He is alert.     Sensory: No sensory deficit.     Motor: No weakness.     Comments:  5 out of 5 strength in bilateral lower and upper extremities.  No notable pronator drift present.  Psychiatric:        Behavior: Behavior normal.     ED Results / Procedures / Treatments   Labs (all labs ordered are listed, but only abnormal results are displayed) Labs Reviewed  CBC WITH DIFFERENTIAL/PLATELET - Abnormal; Notable for the following components:      Result Value   Platelets 141 (*)    All other components within normal limits  COMPREHENSIVE METABOLIC PANEL - Abnormal; Notable for the following components:   Glucose, Bld 114 (*)    BUN 49 (*)    Creatinine, Ser 2.95 (*)    AST 46 (*)    GFR calc non Af Amer 19 (*)    GFR calc Af Amer 22 (*)    All other components within normal limits  LACTIC ACID, PLASMA - Abnormal; Notable for the following components:   Lactic Acid, Venous 2.5 (*)    All other components within normal limits  URINALYSIS, ROUTINE W REFLEX MICROSCOPIC - Abnormal; Notable for the following components:   Color, Urine STRAW (*)    Leukocytes,Ua TRACE (*)    Bacteria, UA RARE (*)    All other components within normal limits  D-DIMER, QUANTITATIVE (NOT AT [facility]) - Abnormal; Notable for the following components:   D-Dimer, Quant 17.20 (*)    All other components within normal limits  TROPONIN I (HIGH SENSITIVITY) - Abnormal; Notable for the following components:   Troponin I (High Sensitivity) 2,126 (*)    All other components within normal limits  TROPONIN I (HIGH SENSITIVITY) - Abnormal; Notable for the following components:   Troponin I (High Sensitivity) 1,642 (*)    All  other components within normal limits  RESPIRATORY PANEL BY RT PCR (FLU A&B, COVID)  URINE CULTURE  CULTURE, BLOOD (ROUTINE X 2)  CULTURE, BLOOD (ROUTINE X 2)  BRAIN NATRIURETIC PEPTIDE  CK    EKG EKG Interpretation  Date/Time:  Sunday November 24 2019 14:27:30 EST Ventricular Rate:  86 PR Interval:    QRS Duration: 98 QT Interval:  404 QTC Calculation: 484 R Axis:   -54 Text Interpretation: Sinus rhythm Left anterior fascicular block Abnormal R-wave progression, late transition new Borderline T abnormalities, diffuse leads Borderline prolonged QT interval Confirmed by Blanchie Dessert [phone]) on 11/24/2019 2:34:49 PM   Radiology CT Head Wo Contrast  Result Date: 11/24/2019 CLINICAL DATA:  Altered mental status EXAM: CT HEAD WITHOUT CONTRAST TECHNIQUE: Contiguous axial images were obtained from the base of the skull through the vertex without intravenous contrast. COMPARISON:  Correlation made with MRI brain 2016 FINDINGS: Brain: There is no acute intracranial hemorrhage, mass-effect, or edema. Gray-white differentiation is preserved. There is no extra-axial fluid collection. Prominence of the ventricles and sulci reflects generalized parenchymal volume loss. Disproportionate ventricular prominence is favored to be on an ex vacuo basis. Patchy and confluent areas of hypoattenuation in the supratentorial white matter are nonspecific but probably reflect advanced chronic microvascular ischemic changes. These findings are similar to prior MRI. Vascular: There is atherosclerotic calcification at the skull base. Skull: Calvarium is unremarkable. Sinuses/Orbits: No acute finding. Other: None. IMPRESSION: No acute intracranial abnormality. Advanced chronic microvascular ischemic changes. Electronically Signed   By: Macy Mis M.D.   On: 11/24/2019 15:05   DG Chest Port 1 View  Result Date: 11/24/2019 CLINICAL DATA:  Progressive weakness.  Back pain. EXAM: PORTABLE CHEST 1 VIEW COMPARISON:   Chest x-ray dated  04/25/2007 FINDINGS: The heart size and mediastinal contours are within normal limits. Both lungs are clear. The visualized skeletal structures are unremarkable. IMPRESSION: Normal exam. Electronically Signed   By: Lorriane Shire M.D.   On: 11/24/2019 14:43    Procedures Procedures (including critical care time)  Medications Ordered in ED Medications - No data to display  ED Course  I have reviewed the triage vital signs and the nursing notes.  Pertinent labs & imaging results that were available during my care of the patient were reviewed by me and considered in my medical decision making (see chart for details).    MDM Rules/Calculators/A&P                      Elderly gentleman presenting today due to generalized weakness for the last 3 to 4 days.  Patient normally can get up and move about his home but now is unable to even get out of bed without significant assistance.  Wife states patient has complained of back pain however on exam here patient's only complaint is of chest pressure on the right side.  Patient is noted to be satting on room air 90 to 92% with blood pressure of 102/69 and heart rate in the high 90s.  He has had decreased oral intake but wife has not noticed fever.  He has seemed winded with activity.  Patient is awake and alert on exam and in no acute distress at this time.  Concern for infectious etiology such as Covid, pneumonia, UTI or potential infected stone with a complaint of back pain.  Also concern for possible cardiac etiology.  Wife also reports that patient has not been acting himself and has seemed more demented in the recent past but lower suspicion for stroke at this time.  No trauma so fracture or head bleed would be unusual.  Patient is also had some recent medication changes so he could have symptoms related to dehydration or acute kidney injury.  Labs and imaging pending.  4:35 PM Head CT and chest x-ray without acute findings, CBC  without acute findings, CMP with new AKI with creatinine of 2.95 with a normal anion gap.  Normal creatinine is 1.4.  Lactate elevated at 2.5.  Troponin elevated at 2126.  BNP within normal limits.  Urine is pending.  Patient's EKG does not show any signs of ST elevation consistent with STEMI.  However he was complaining of chest discomfort.  Given patient's elevated renal function will do D-dimer to evaluate risk for potential PE to ensure the elevated troponin is not related to heart strain.  However given elevated creatinine patient would not be a candidate for CTA and would need VQ scan. Covid is negative.  Patient given an IV fluid bolus due to elevated lactate and new AKI.  6:53 PM To troponin is mildly improved at 1642.  D-dimer is elevated at 17.  Due to patient's creatinine he will need a VQ scan but he was started on a heparin drip prophylactically.  Will admit for further care.  CRITICAL CARE Performed by: Joseff Luckman Total critical care time: 30 minutes Critical care time was exclusive of separately billable procedures and treating other patients. Critical care was necessary to treat or prevent imminent or life-threatening deterioration. Critical care was time spent personally by me on the following activities: development of treatment plan with patient and/or surrogate as well as nursing, discussions with consultants, evaluation of patient's response to treatment, examination of patient, obtaining history  from patient or surrogate, ordering and performing treatments and interventions, ordering and review of laboratory studies, ordering and review of radiographic studies, pulse oximetry and re-evaluation of patient's condition.   Final Clinical Impression(s) / ED Diagnoses Final diagnoses:  AKI (acute kidney injury) ([place])  Elevated troponin  Chest pain, unspecified type    Rx / DC Orders ED Discharge Orders    None       Blanchie Dessert, MD 11/24/19 Einar Crow

## 2019-11-24 NOTE — ED Triage Notes (Signed)
Per EMS, the pt's family reports his dementia has gotten worse over the past few weeks and he was complaining of back pain last night.

## 2019-11-24 NOTE — Progress Notes (Signed)
ANTICOAGULATION CONSULT NOTE - Initial Consult  Pharmacy Consult for Heparin Indication: R/O pulmonary embolism  No Known Allergies  Patient Measurements: Weight: 190 lb (86.2 kg)  Height: 70 inches Heparin Dosing Weight: actual body weight  Vital Signs: Temp: 99.5 F (37.5 C) (01/10 1600) Temp Source: Rectal (01/10 1600) BP: 113/64 (01/10 1915) Pulse Rate: 75 (01/10 1655)  Labs: Recent Labs    11/24/19 1412 11/24/19 1730  HGB 16.3  --   HCT 50.0  --   PLT 141*  --   CREATININE 2.95*  --   CKTOTAL  --  1,144*  TROPONINIHS 2,126* 1,642*    CrCl cannot be calculated (Unknown ideal weight.).   Medical History: Past Medical History:  Diagnosis Date  . Arthritis   . Borderline diabetes mellitus   . BPH (benign prostatic hypertrophy)   . DDD (degenerative disc disease), lumbar   . GERD (gastroesophageal reflux disease)   . History of bladder cancer   . Hyperlipidemia   . Hypertension   . Nocturia   . OSA on CPAP    PER STUDY  05/2007  MODERATE OSA  . Wears glasses     Medications:  No oral anticoagulation PTA  Assessment:  79 yr male presents with back pain and AMS  Elevated D-dimer  PMH significant for DM, HTN, HLD, prevoius bladder cancer  Pharmacy consulted to dose IV heparin for presumed pulmonary embolism  Goal of Therapy:  Heparin level 0.3-0.7 units/ml Monitor platelets by anticoagulation protocol: Yes   Plan:   Obtain baseline aPTT and INR  Heparin 3000 unit IV bolus x 1 followed by heparin infusion @ 1350 units/hr  Check heparin level 8 hr after heparin infusion started  Follow heparin level and CBC daily while on IV heparin  Tony Fox, Toribio Harbour, PharmD 11/24/2019,7:34 PM

## 2019-11-24 NOTE — H&P (Signed)
History and Physical    Tony Fox [id number] DOB: [birth year] DOA: 11/24/2019  PCP: Willey Blade, MD  Patient coming from: Home  I have personally briefly reviewed patient's old medical records in [place]  Chief Complaint: Generalized weakness  HPI: Tony Fox is a 79 y.o. male with medical history significant of hypertension, hyperlipidemia, dementia, type 2 diabetes, vitamin D deficiency who presented to [facility] ED from home with his wife due to progressive generalized weakness over 3 to 4 days.  History obtained from patient's wife by telephone and from the chart, given patient's dementia he is unable to provide history.  Wife reports that at baseline patient will get up on his own to go to the bathroom (notes he is incontinent but sometimes uses the restroom) but had not been getting up for the past couple days.  Yesterday, patient's son actually had to pick him up in order to get him up.  Wife reports also had a poor appetite recently, has complained of back pain without any trauma or other injury, and wife notes he has been somewhat short of breath with ambulation.  Patient has not had any fevers or chills, nausea, vomiting, diarrhea, cough or known sick contacts.  At baseline patient ambulates with a cane, wife recently got him a walker.  Per report, patient had been seen by PCP recently for increased leg swelling.  His HCTZ was changed to Lasix.  Wife states patient has no known history of blood clots, but is rather sedentary.  Per ED physician, patient had complained of right-sided chest pain during ED encounter.  During my encounter patient denied any chest pain.  Wife stated patient had not complained of chest pain at home.  ED Course: Temp 99.26F, O2 sat 92% to 96% on room air, mildly tachypneic respiratory rate 22.  Labs notable for creatinine 2.95, BNP normal 21.7, highly sensitive troponin elevated but trended downward (2126--> 1642), total CK elevated  1144, lactic acid elevated 2.5.  D-dimer elevated 17.2.  UA negative for infection.  Covid PCR negative.  Influenza A/B negative. CBC normal with the exception of mild thrombocytopenia with platelets 141.  Noncontrast head CT showed only advanced chronic ischemic changes.  Chest x-ray was normal. ECG was normal sinus rhythm at 77 bpm with nonspecific T wave changes.  CTA chest was not performed to evaluate for pulmonary embolism due to acute kidney injury.  Patient was started empirically on heparin gtt for presumed PE more likely than ACS and IV fluids.  Patient admitted as inpatient for further evaluation management.  Review of Systems: As per HPI otherwise 10 point review of systems negative.    Past Medical History:  Diagnosis Date  . Arthritis   . Borderline diabetes mellitus   . BPH (benign prostatic hypertrophy)   . DDD (degenerative disc disease), lumbar   . GERD (gastroesophageal reflux disease)   . History of bladder cancer   . Hyperlipidemia   . Hypertension   . Nocturia   . OSA on CPAP    PER STUDY  05/2007  MODERATE OSA  . Wears glasses     Past Surgical History:  Procedure Laterality Date  . CYSTOSCOPY W/ RETROGRADES Bilateral 12/30/2014   Procedure: CYSTOSCOPY WITH RETROGRADE PYELOGRAM;  Surgeon: Arvil Persons, MD;  Location: [facility];  Service: Urology;  Laterality: Bilateral;  . CYSTOSCOPY WITH BIOPSY N/A 03/07/2014   Procedure: CYSTOSCOPY WITH Bladder Washings and Bladder BIOPSY;  Surgeon: Lowella Bandy, MD;  Location: [place];  Service: Urology;  Laterality: N/A;  . CYSTOSCOPY WITH BIOPSY N/A 12/30/2014   Procedure: CYSTOSCOPY WITH BIOPSY;  Surgeon: Arvil Persons, MD;  Location: [facility];  Service: Urology;  Laterality: N/A;  . TRANSURETHRAL RESECTION OF BLADDER TUMOR  12-30-2008//   11-18-2008//   10-08-2008  . TRANSURETHRAL RESECTION OF PROSTATE N/A 12/30/2014   Procedure: TRANSURETHRAL RESECTION PROSTATE BIOPSY;   Surgeon: Arvil Persons, MD;  Location: [facility];  Service: Urology;  Laterality: N/A;     reports that he has never smoked. He has never used smokeless tobacco. He reports that he does not drink alcohol or use drugs.  No Known Allergies  Family History  Problem Relation Age of Onset  . Alzheimer's disease Mother   . Stroke Father      Prior to Admission medications   Medication Sig Start Date End Date Taking? Authorizing Provider  Ascorbic Acid (VITAMIN C PO) Take 1 tablet by mouth daily.   Yes [provider]  aspirin EC 81 MG tablet Take 81 mg by mouth daily.   Yes [provider]  atorvastatin (LIPITOR) 20 MG tablet Take 20 mg by mouth daily. 11/04/19  Yes [provider]  donepezil (ARICEPT) 5 MG tablet Take 5 mg by mouth 2 (two) times daily.   Yes [provider]  furosemide (LASIX) 20 MG tablet Take 20 mg by mouth daily. 11/04/19  Yes [provider]  glipiZIDE-metformin (METAGLIP) 2.5-500 MG tablet Take 1 tablet by mouth daily. 11/04/19  Yes [provider]  hydrochlorothiazide (HYDRODIURIL) 25 MG tablet Take 25 mg by mouth every morning.    Yes [provider]  Turmeric (QC TUMERIC COMPLEX PO) Take 1 capsule by mouth daily.   Yes [provider]  Vitamin D, Ergocalciferol, (DRISDOL) 1.25 MG (50000 UT) CAPS capsule Take 50,000 Units by mouth once a week. 11/04/19  Yes [provider]  ondansetron (ZOFRAN) 4 MG tablet Take 1 tablet (4 mg total) by mouth every 8 (eight) hours as needed for nausea or vomiting. Patient not taking: Reported on 11/24/2019 03/25/16   Quintella Reichert, MD    Physical Exam: Vitals:   11/24/19 1915 11/24/19 1915 11/24/19 1930 11/24/19 1945  BP: 113/64  126/69 115/65  Pulse:    75  Resp: (!) 23  (!) 21 (!) 22  Temp:      TempSrc:      SpO2:    96%  Weight:  86.2 kg      Constitutional: NAD, calm, comfortable Eyes: EOMI, lids and conjunctivae  normal ENMT: Mucous membranes are moist.  Hearing grossly normal. Respiratory: clear to auscultation bilaterally, no wheezing, no crackles. Normal respiratory effort. No accessory muscle use.  Cardiovascular: Regular rate and rhythm, no murmurs / rubs / gallops. No extremity edema. 2+ pedal pulses.  Abdomen: no tenderness, no masses palpated. No hepatosplenomegaly. Bowel sounds positive.  Musculoskeletal: no clubbing / cyanosis. No joint deformity upper and lower extremities.  Normal muscle tone.  Skin: Dry, intact, normal temperature Neurologic: CN 2-12 grossly intact.  Normal speech.  Psychiatric: Normal mood, congruent affect.  Abnormal judgment and insight due to dementia  Labs on Admission: I have personally reviewed following labs and imaging studies  CBC: Recent Labs  Lab 11/24/19 1412  WBC 7.6  NEUTROABS 5.2  HGB 16.3  HCT 50.0  MCV 89.4  PLT [id number]*   Basic Metabolic Panel: Recent Labs  Lab 11/24/19 1412  NA 141  K 3.9  CL 104  CO2 22  GLUCOSE 114*  BUN 49*  CREATININE 2.95*  CALCIUM 9.6   GFR: CrCl cannot be calculated (Unknown ideal weight.). Liver Function Tests: Recent Labs  Lab 11/24/19 1412  AST 46*  ALT 27  ALKPHOS 48  BILITOT 1.2  PROT 8.0  ALBUMIN 4.2   No results for input(s): LIPASE, AMYLASE in the last 168 hours. No results for input(s): AMMONIA in the last 168 hours. Coagulation Profile: No results for input(s): INR, PROTIME in the last 168 hours. Cardiac Enzymes: Recent Labs  Lab 11/24/19 1730  CKTOTAL 1,144*   BNP (last 3 results) No results for input(s): PROBNP in the last 8760 hours. HbA1C: No results for input(s): HGBA1C in the last 72 hours. CBG: No results for input(s): GLUCAP in the last 168 hours. Lipid Profile: No results for input(s): CHOL, HDL, LDLCALC, TRIG, CHOLHDL, LDLDIRECT in the last 72 hours. Thyroid Function Tests: No results for input(s): TSH, T4TOTAL, FREET4, T3FREE, THYROIDAB in the last 72 hours. Anemia  Panel: No results for input(s): VITAMINB12, FOLATE, FERRITIN, TIBC, IRON, RETICCTPCT in the last 72 hours. Urine analysis:    Component Value Date/Time   COLORURINE STRAW (A) 11/24/2019 1412   APPEARANCEUR CLEAR 11/24/2019 1412   LABSPEC 1.010 11/24/2019 1412   PHURINE 5.0 11/24/2019 1412   GLUCOSEU NEGATIVE 11/24/2019 1412   HGBUR NEGATIVE 11/24/2019 1412   BILIRUBINUR NEGATIVE 11/24/2019 1412   KETONESUR NEGATIVE 11/24/2019 1412   PROTEINUR NEGATIVE 11/24/2019 1412   UROBILINOGEN 0.2 09/23/2008 0237   NITRITE NEGATIVE 11/24/2019 1412   LEUKOCYTESUR TRACE (A) 11/24/2019 1412    Radiological Exams on Admission: CT Head Wo Contrast  Result Date: 11/24/2019 CLINICAL DATA:  Altered mental status EXAM: CT HEAD WITHOUT CONTRAST TECHNIQUE: Contiguous axial images were obtained from the base of the skull through the vertex without intravenous contrast. COMPARISON:  Correlation made with MRI brain 2016 FINDINGS: Brain: There is no acute intracranial hemorrhage, mass-effect, or edema. Gray-white differentiation is preserved. There is no extra-axial fluid collection. Prominence of the ventricles and sulci reflects generalized parenchymal volume loss. Disproportionate ventricular prominence is favored to be on an ex vacuo basis. Patchy and confluent areas of hypoattenuation in the supratentorial white matter are nonspecific but probably reflect advanced chronic microvascular ischemic changes. These findings are similar to prior MRI. Vascular: There is atherosclerotic calcification at the skull base. Skull: Calvarium is unremarkable. Sinuses/Orbits: No acute finding. Other: None. IMPRESSION: No acute intracranial abnormality. Advanced chronic microvascular ischemic changes. Electronically Signed   By: Macy Mis M.D.   On: 11/24/2019 15:05   DG Chest Port 1 View  Result Date: 11/24/2019 CLINICAL DATA:  Progressive weakness.  Back pain. EXAM: PORTABLE CHEST 1 VIEW COMPARISON:  Chest x-ray dated  04/25/2007 FINDINGS: The heart size and mediastinal contours are within normal limits. Both lungs are clear. The visualized skeletal structures are unremarkable. IMPRESSION: Normal exam. Electronically Signed   By: Lorriane Shire M.D.   On: 11/24/2019 14:43    EKG: Independently reviewed.  Normal sinus rhythm, 77 bpm, PVC, nonspecific T wave changes.  Assessment/Plan Principal Problem:   Generalized weakness Active Problems:   Elevated troponin level not due to acute coronary syndrome   AKI (acute kidney injury) ([place])   Elevated d-dimer   Lactic acidosis   Thrombocytopenia (HCC)   Essential hypertension   Uncomplicated type 2 diabetes mellitus (HCC)   Memory loss   Hyperlipidemia  Generalized weakness -at baseline, patient ambulatory with a cane, was too  weak to ambulate upon admission.  Suspect due to acute renal failure and possible PE.  Nothing to suggest infection on evaluation thus far.  No focal neurologic deficits. -PT eval -Fall precautions  Elevated troponin level not due to acute coronary syndrome -highly sensitive troponin significantly elevated in the ED at 2126--> 1642.  ECG with ultimately nonspecific T wave changes.  Patient without active chest pain upon admission.  Presentation does not seem consistent with ACS at this time. --Trend troponins --Consider cardiology consult in AM --Heparin gtt for suspected PE (more likely than ACS) --Continue aspirin --Echo in a.m. --Stat ECG if active chest pain  Elevated d-dimer -17.2 on admission, concerning for VTE.  Given acute renal failure, cannot obtain CTA chest at this time. --Continue heparin gtt empirically for possible PE --VQ scan in a.m. --Echo in a.m. --Supplemental oxygen if needed to maintain O2 sat greater than 90%  AKI (acute kidney injury) ([place]) -suspect prerenal azotemia due to recent poor p.o. intake.  Creatinine 2.95 on admission.  On chart review only prior creatinine from 2015 was 1.41.  Received LR bolus  in the ED. --Continue gentle IV hydration --Daily BMP to monitor --Avoid nephrotoxins and renally dose meds as indicated  Lactic acidosis -2.5 on admission, no evidence of infection.  Suspect elevated due to renal insufficiency.  Expect improvement with hydration. --Repeat lactate with morning labs  Thrombocytopenia (HCC) -platelets 141k on admission.  Unknown if chronic.  Monitor closely on heparin.   Comorbidities:  Essential hypertension -hold home HCTZ and Lasix given AKI.  Hydralazine as needed for now.  Uncomplicated type 2 diabetes mellitus ([place]) --hold home glipizide-Metformin --Sensitive sliding scale NovoLog  Memory loss -continue home Aricept  Hyperlipidemia -continue home aspirin and Lipitor    DVT prophylaxis: Therapeutic heparin Code Status: DNR -verified with patient's wife/HCPOA by telephone upon admission Family Communication: Wife, Tony Fox ([place] pro-tem of [place]) [phone] --updated by phone upon admission, plan discussed in detail, all questions answered. Disposition Plan: Expect discharge home in approximately 48 hours, pending clinical improvement and PT eval Consults called: None -consider cardiology in the morning Admission status: Inpatient   Severity of Illness: The appropriate patient status for this patient is INPATIENT. Inpatient status is judged to be reasonable and necessary in order to provide the required intensity of service to ensure the patient's safety. The patient's presenting symptoms, physical exam findings, and initial radiographic and laboratory data in the context of their chronic comorbidities is felt to place them at high risk for further clinical deterioration. Furthermore, it is not anticipated that the patient will be medically stable for discharge from the hospital within 2 midnights of admission. The following factors support the patient status of inpatient.    "           The patient's presenting symptoms include   progressive generalized weakness, poor p.o. intake, inability to ambulate, dyspnea on exertion. "           The worrisome physical exam findings include  tachypnea and intermittent hypoxia. "           The initial radiographic and laboratory data are worrisome because of  elevated troponin, elevated D-dimer, acute renal failure. "           The chronic co-morbidities include  dimension, hypertension, hyperlipidemia, type 2 diabetes, vitamin D deficiency.     * I certify that at the point of admission it is my clinical judgment that the patient will require inpatient hospital care spanning  beyond 2 midnights from the point of admission due to high intensity of service, high risk for further deterioration and high frequency of surveillance required.*    Ezekiel Slocumb, DO [facility]   If 7PM-7AM, please contact night-coverage [email or web address] Password [email or web address]  11/24/2019, 8:12 PM

## 2019-11-25 ENCOUNTER — Inpatient Hospital Stay (HOSPITAL_COMMUNITY): Payer: Medicare PPO

## 2019-11-25 DIAGNOSIS — I371 Nonrheumatic pulmonary valve insufficiency: Secondary | ICD-10-CM

## 2019-11-25 DIAGNOSIS — R7989 Other specified abnormal findings of blood chemistry: Secondary | ICD-10-CM

## 2019-11-25 DIAGNOSIS — I361 Nonrheumatic tricuspid (valve) insufficiency: Secondary | ICD-10-CM

## 2019-11-25 LAB — URINE CULTURE

## 2019-11-25 LAB — GLUCOSE, CAPILLARY
Glucose-Capillary: 124 mg/dL — ABNORMAL HIGH (ref 70–99)
Glucose-Capillary: 147 mg/dL — ABNORMAL HIGH (ref 70–99)

## 2019-11-25 LAB — TROPONIN I (HIGH SENSITIVITY)
Troponin I (High Sensitivity): 710 ng/L (ref <18)
Troponin I (High Sensitivity): 422 ng/L (ref <18)
Troponin I (High Sensitivity): 383 ng/L (ref <18)

## 2019-11-25 LAB — CBC
HCT: 45.4 % (ref 39.0–52.0)
Hemoglobin: 14.8 g/dL (ref 13.0–17.0)
MCH: 29.6 pg (ref 26.0–34.0)
MCHC: 32.6 g/dL (ref 30.0–36.0)
MCV: 90.8 fL (ref 80.0–100.0)
Platelets: 123 10*3/uL — ABNORMAL LOW (ref 150–400)
RBC: 5 MIL/uL (ref 4.22–5.81)
RDW: 12.9 % (ref 11.5–15.5)
WBC: 5.8 10*3/uL (ref 4.0–10.5)
nRBC: 0 % (ref 0.0–0.2)

## 2019-11-25 LAB — BASIC METABOLIC PANEL
Anion gap: 12 (ref 5–15)
BUN: 46 mg/dL — ABNORMAL HIGH (ref 8–23)
CO2: 23 mmol/L (ref 22–32)
Calcium: 9 mg/dL (ref 8.9–10.3)
Chloride: 107 mmol/L (ref 98–111)
Creatinine, Ser: 2.72 mg/dL — ABNORMAL HIGH (ref 0.61–1.24)
GFR calc Af Amer: 24 mL/min — ABNORMAL LOW (ref >60)
GFR calc non Af Amer: 21 mL/min — ABNORMAL LOW (ref >60)
Glucose, Bld: 132 mg/dL — ABNORMAL HIGH (ref 70–99)
Potassium: 4.4 mmol/L (ref 3.5–5.1)
Sodium: 142 mmol/L (ref 135–145)

## 2019-11-25 LAB — ECHOCARDIOGRAM COMPLETE: Weight: 3245.17 oz

## 2019-11-25 LAB — PROTIME-INR
INR: 1.1 (ref 0.8–1.2)
Prothrombin Time: 14.3 seconds (ref 11.4–15.2)

## 2019-11-25 LAB — HEPARIN LEVEL (UNFRACTIONATED)
Heparin Unfractionated: 1.06 IU/mL — ABNORMAL HIGH (ref 0.30–0.70)
Heparin Unfractionated: 0.62 IU/mL (ref 0.30–0.70)
Heparin Unfractionated: 0.45 IU/mL (ref 0.30–0.70)

## 2019-11-25 LAB — APTT: aPTT: 171 seconds (ref 24–36)

## 2019-11-25 LAB — HEMOGLOBIN A1C
Hgb A1c MFr Bld: 8.7 % — ABNORMAL HIGH (ref 4.8–5.6)
Mean Plasma Glucose: 202.99 mg/dL

## 2019-11-25 LAB — LACTIC ACID, PLASMA: Lactic Acid, Venous: 1.7 mmol/L (ref 0.5–1.9)

## 2019-11-25 MED ORDER — TECHNETIUM TO 99M ALBUMIN AGGREGATED
1.6400 | Freq: Once | INTRAVENOUS | Status: AC
  Administered 2019-11-25: 12:00:00 1.64 via INTRAVENOUS

## 2019-11-25 MED ORDER — TECHNETIUM TC 99M DIETHYLENETRIAME-PENTAACETIC ACID
42.0000 | Freq: Once | INTRAVENOUS | Status: AC
  Administered 2019-11-25: 11:00:00 42 via RESPIRATORY_TRACT

## 2019-11-25 NOTE — Progress Notes (Signed)
ANTICOAGULATION CONSULT NOTE - Initial Consult  Pharmacy Consult for Heparin Indication: R/O pulmonary embolism  No Known Allergies  Patient Measurements: Weight: 202 lb 13.2 oz (92 kg)  Height: 70 inches Heparin Dosing Weight: actual body weight  Vital Signs: Temp: 98 F (36.7 C) (01/11 0555) Temp Source: Oral (01/11 0555) BP: 101/59 (01/11 0555) Pulse Rate: 63 (01/11 0555)  Labs: Recent Labs    11/24/19 1412 11/24/19 1730 11/25/19 0454  HGB 16.3  --  14.8  HCT 50.0  --  45.4  PLT 141*  --  123*  APTT  --  31  --   LABPROT  --  13.7  --   INR  --  1.1  --   HEPARINUNFRC  --   --  1.06*  CREATININE 2.95*  --  2.72*  CKTOTAL  --  1,144*  --   TROPONINIHS 2,126* 1,642*  --     CrCl cannot be calculated (Unknown ideal weight.).  Medications:  No oral anticoagulation PTA  Assessment:  79 yr male presents with back pain and AMS  Elevated D-dimer  PMH significant for DM, HTN, HLD, prevoius bladder cancer  Pharmacy consulted to dose IV heparin for presumed pulmonary embolism  Today, 11/25/2019:  Hgb stable; Plt slightly lower but remains > 100k  Trop trending down  First heparin level high on 1350 units/hr, confirmed with RN level was drawn appropriately from opposite arm as heparin infusion  Goal of Therapy:  Heparin level 0.3-0.7 units/ml Monitor platelets by anticoagulation protocol: Yes   Plan:   Reduce heparin to 900 units/hr  Recheck heparin level 8 hr after rate change  Follow heparin level and CBC daily while on IV heparin  Vitalia Stough A, PharmD 11/25/2019,6:11 AM

## 2019-11-25 NOTE — Evaluation (Signed)
Physical Therapy Evaluation Patient Details Name: Tony Fox MRN: [id number] DOB: Aug 16, 1941 Today's Date: 11/25/2019   History of Present Illness  79 yo male admitted with weakness. Hx of dementia  Clinical Impression  Limited eval. Total assist for any mobility. Pt was not willing to get OOB with PT on today despite encouragement. No family present during session. Will follow and continue to assess pt's ability to participate with therapy. Recommend SNF unless family decides to take pt back home.    Follow Up Recommendations SNF;Home health PT;Supervision/Assistance - 24 hour(depending on ability to participate with PT and family decision)    Equipment Recommendations  None recommended by PT    Recommendations for Other Services       Precautions / Restrictions Precautions Precautions: Fall Restrictions Weight Bearing Restrictions: No      Mobility  Bed Mobility Overal bed mobility: Needs Assistance Bed Mobility: Supine to Sit;Sit to Supine     Supine to sit: Total assist Sit to supine: Total assist   General bed mobility comments: Pt brought legs partially of side of bed but then declined further participation. Assisted legs back onto bed since he would not participate any further.  Transfers                    Ambulation/Gait                Stairs            Wheelchair Mobility    Modified Rankin (Stroke Patients Only)       Balance                                             Pertinent Vitals/Pain Pain Assessment: Faces Faces Pain Scale: No hurt    Home Living Family/patient expects to be discharged to:: Unsure                      Prior Function           Comments: unsure of PLOF-pt is not a good historian-dementia     Hand Dominance        Extremity/Trunk Assessment   Upper Extremity Assessment Upper Extremity Assessment: Generalized weakness    Lower Extremity  Assessment Lower Extremity Assessment: Generalized weakness    Cervical / Trunk Assessment Cervical / Trunk Assessment: Normal  Communication   Communication: No difficulties  Cognition Arousal/Alertness: Awake/alert Behavior During Therapy: WFL for tasks assessed/performed Overall Cognitive Status: History of cognitive impairments - at baseline                                        General Comments      Exercises     Assessment/Plan    PT Assessment Patient needs continued PT services  PT Problem List Decreased strength;Decreased mobility;Decreased activity tolerance;Decreased balance;Decreased knowledge of use of DME;Decreased cognition;Decreased knowledge of precautions;Decreased safety awareness       PT Treatment Interventions DME instruction;Gait training;Therapeutic exercise;Therapeutic activities;Patient/family education;Balance training;Functional mobility training    PT Goals (Current goals can be found in the Care Plan section)  Acute Rehab PT Goals Patient Stated Goal: none stated PT Goal Formulation: With family Time For Goal Achievement: 12/09/19 Potential to Achieve Goals: Fair    Frequency  Min 2X/week   Barriers to discharge        Co-evaluation               AM-PAC PT "6 Clicks" Mobility  Outcome Measure Help needed turning from your back to your side while in a flat bed without using bedrails?: Total Help needed moving from lying on your back to sitting on the side of a flat bed without using bedrails?: Total Help needed moving to and from a bed to a chair (including a wheelchair)?: Total Help needed standing up from a chair using your arms (e.g., wheelchair or bedside chair)?: Total Help needed to walk in hospital room?: Total Help needed climbing 3-5 steps with a railing? : Total 6 Click Score: 6    End of Session   Activity Tolerance: Patient tolerated treatment well Patient left: in bed;with call bell/phone within  reach;with bed alarm set   PT Visit Diagnosis: Muscle weakness (generalized) (M62.81);Difficulty in walking, not elsewhere classified (R26.2)    Time: [id number] PT Time Calculation (min) (ACUTE ONLY): 20 min   Charges:   PT Evaluation $PT Eval Moderate Complexity: 1 Mod             Marielouise Amey P, PT Acute Rehabilitation

## 2019-11-25 NOTE — Progress Notes (Signed)
CRITICAL VALUE ALERT  Critical Value:  APTT 171  Date & Time Notied:  [id number]  Provider Notified: YES  Orders Received/Actions taken: no new orders

## 2019-11-25 NOTE — Progress Notes (Signed)
  Echocardiogram 2D Echocardiogram has been performed.  Tony Fox 11/25/2019, 3:33 PM

## 2019-11-25 NOTE — Progress Notes (Signed)
PROGRESS NOTE    Tony Fox  [id number] DOB: Apr 04, 1941 DOA: 11/24/2019 PCP: Willey Blade, MD    Brief Narrative:   Tony Fox is a 79 y.o. male with medical history significant of hypertension, hyperlipidemia, dementia, type 2 diabetes, vitamin D deficiency who presented to [facility] ED from home with his wife due to progressive generalized weakness over 3 to 4 days.  History obtained from patient's wife by telephone and from the chart, given patient's dementia he is unable to provide history.  Wife reports that at baseline patient will get up on his own to go to the bathroom (notes he is incontinent but sometimes uses the restroom) but had not been getting up for the past couple days.  Yesterday, patient's son actually had to pick him up in order to get him up.  Wife reports also had a poor appetite recently, has complained of back pain without any trauma or other injury, and wife notes he has been somewhat short of breath with ambulation.  Patient has not had any fevers or chills, nausea, vomiting, diarrhea, cough or known sick contacts.  At baseline patient ambulates with a cane, wife recently got him a walker.  Per report, patient had been seen by PCP recently for increased leg swelling.  His HCTZ was changed to Lasix.  Wife states patient has no known history of blood clots, but is rather sedentary.  Per ED physician, patient had complained of right-sided chest pain during ED encounter.  During my encounter patient denied any chest pain.  Wife stated patient had not complained of chest pain at home.  ED Course: Temp 99.55F, O2 sat 92% to 96% on room air, mildly tachypneic respiratory rate 22.  Labs notable for creatinine 2.95, BNP normal 21.7, highly sensitive troponin elevated but trended downward (2126--> 1642), total CK elevated 1144, lactic acid elevated 2.5.  D-dimer elevated 17.2.  UA negative for infection.  Covid PCR negative.  Influenza A/B negative. CBC normal with  the exception of mild thrombocytopenia with platelets 141.  Noncontrast head CT showed only advanced chronic ischemic changes.  Chest x-ray was normal. ECG was normal sinus rhythm at 77 bpm with nonspecific T wave changes.  CTA chest was not performed to evaluate for pulmonary embolism due to acute kidney injury.  Patient was started empirically on heparin gtt for presumed PE more likely than ACS and IV fluids.  Patient admitted as inpatient for further evaluation management.   Assessment & Plan:   Principal Problem:   Generalized weakness Active Problems:   Memory loss   Essential hypertension   Hyperlipidemia   Uncomplicated type 2 diabetes mellitus (HCC)   Elevated troponin level not due to acute coronary syndrome   AKI (acute kidney injury) ([place])   Elevated d-dimer   Lactic acidosis   Thrombocytopenia (HCC)   Generalized weakness Patient presenting to the ED with progressive weakness, poor appetite.  At baseline ambulatory with a cane.  No infectious etiology elucidated.  No focal neurologic deficits. --Pending PT evaluation --Continue fall precautions  Elevated troponin level not due to acute coronary syndrome  hsTrop significant elevated in the ED, 2126.  EKG with nonspecific T wave changes.  Patient has denied any active chest pain during his hospitalization or prior to admission. --hsTrop 2,126-->1,642-->710 --started on heparin gtt for suspected PE --Continue aspirin --Pending echocardiogram --Continue to follow troponin level to ensure continues to trend down  Acute right lower extremity DVT Elevated d-dimer  D-dimer elevated at 17.20.  No previous history of DVT/PE, but is sedentary most of the time.  Unable to obtain CTA chest secondary to acute renal failure.  Q scan with multiple small to moderate matched ventilation/perfusion defects in both lungs which is nondiagnostic for PE.  Bilateral lower extremity duplex ultrasound positive for acute DVT right femoral vein,  right popliteal vein, and right posterior tibial veins. --Continue heparin drip; likely transition to NOAC on 1/12 --Echocardiogram: Pending --Continue supplemental oxygen, currently on 2L nasal cannula, titrate to maintain SPO2 greater than 92%  AKI (acute kidney injury) Etiology likely prerenal azotemia secondary to poor oral intake.  Creatinine was noted to be elevated 2.95 on admission, chart review notable for creatinine 2015 1.41.  Status post 1 L LR bolus in ED. --Cr 2.95-->2.72 --Continue NS at 75 mL's per hour --Holding home furosemide 20 mg daily --Avoid nephrotoxins, renally dose all medications --Strict I's and O's --Follow BMP daily  Lactic acidosis: Resolved Lactic acid 2.5 on admission, no evidence of infection.  Suspect elevation secondary to acute renal failure as above. --LA 2.5-->1.7  Thrombocytopenia Platelet count 141K on admission.  Unknown etiology/chronicity. --Continue monitor CBC closely while on heparin drip  HLD: Continue atorvastatin 20 mg p.o. daily  Dementia: Continue donepezil 5 mg p.o. twice daily  Type 2 diabetes mellitus On glipizide-Metformin 2.5-500 mg p.o. daily at home. --Hold oral hypoglycemic while inpatient --Insulin sliding scale for coverage --CBGs qAC/HS   DVT prophylaxis: Heparin drip Code Status: DNR Family Communication:  Disposition Plan: Continue inpatient, heparin drip, pending therapy evaluation, further depending on clinical course, from home with spouse.   Consultants:   None  Procedures:   VQ scan: Indeterminant  TTE: EF 55-60%.  G1DD.  Normal RV systolic function.  Normal IVC in size.  Bilateral lower extremity venous duplex ultrasound: Positive for right lower extremity DVT  Antimicrobials:   None   Subjective: Patient seen and examined bedside, resting comfortably.  Slightly fatigued/weak.  Pleasantly confused.  No complaints this morning.  Specifically denies headache, no fever/chills/night sweats,  no nausea/vomiting/diarrhea, no chest pain, palpitations, no dizziness, no abdominal pain, no cough/congestion.  No acute events overnight per nursing staff.  Objective: Vitals:   11/24/19 2138 11/25/19 0553 11/25/19 0555 11/25/19 1313  BP: 120/83  (!) 101/59 117/63  Pulse: 71  63 72  Resp: 19  18 18   Temp: 98.3 F (36.8 C)  98 F (36.7 C) 98.9 F (37.2 C)  TempSrc: Oral  Oral Oral  SpO2: 99%  96% 94%  Weight:  92 kg      Intake/Output Summary (Last 24 hours) at 11/25/2019 1440 Last data filed at 11/25/2019 1045 Gross per 24 hour  Intake 1240 ml  Output 875 ml  Net 365 ml   Filed Weights   11/24/19 1915 11/25/19 0553  Weight: 86.2 kg 92 kg    Examination:  General exam: Appears calm and comfortable, pleasantly confused Respiratory system: Clear to auscultation. Respiratory effort normal.  On 2 L nasal cannula Cardiovascular system: S1 & S2 heard, RRR. No JVD, murmurs, rubs, gallops or clicks. No pedal edema. Gastrointestinal system: Abdomen is nondistended, soft and nontender. No organomegaly or masses felt. Normal bowel sounds heard. Central nervous system: Alert, not oriented to person/place/time. No focal neurological deficits. Extremities: Symmetric 5 x 5 power. Skin: No rashes, lesions or ulcers Psychiatry: Judgement and insight appear poor.  Depressed mood & flat affect.     Data Reviewed: I have personally reviewed following labs and imaging studies  CBC: Recent  Labs  Lab 11/24/19 1412 11/25/19 0454  WBC 7.6 5.8  NEUTROABS 5.2  --   HGB 16.3 14.8  HCT 50.0 45.4  MCV 89.4 90.8  PLT 141* [id number]   Basic Metabolic Panel: Recent Labs  Lab 11/24/19 1412 11/25/19 0454  NA 141 142  K 3.9 4.4  CL 104 107  CO2 22 23  GLUCOSE 114* 132*  BUN 49* 46*  CREATININE 2.95* 2.72*  CALCIUM 9.6 9.0   GFR: CrCl cannot be calculated (Unknown ideal weight.). Liver Function Tests: Recent Labs  Lab 11/24/19 1412  AST 46*  ALT 27  ALKPHOS 48  BILITOT 1.2  PROT  8.0  ALBUMIN 4.2   No results for input(s): LIPASE, AMYLASE in the last 168 hours. No results for input(s): AMMONIA in the last 168 hours. Coagulation Profile: Recent Labs  Lab 11/24/19 1730 11/25/19 0454  INR 1.1 1.1   Cardiac Enzymes: Recent Labs  Lab 11/24/19 1730  CKTOTAL 1,144*   BNP (last 3 results) No results for input(s): PROBNP in the last 8760 hours. HbA1C: Recent Labs    11/25/19 0454  HGBA1C 8.7*   CBG: Recent Labs  Lab 11/24/19 2319 11/25/19 0734  GLUCAP 102* 124*   Lipid Profile: No results for input(s): CHOL, HDL, LDLCALC, TRIG, CHOLHDL, LDLDIRECT in the last 72 hours. Thyroid Function Tests: No results for input(s): TSH, T4TOTAL, FREET4, T3FREE, THYROIDAB in the last 72 hours. Anemia Panel: No results for input(s): VITAMINB12, FOLATE, FERRITIN, TIBC, IRON, RETICCTPCT in the last 72 hours. Sepsis Labs: Recent Labs  Lab 11/24/19 1412 11/25/19 0454  LATICACIDVEN 2.5* 1.7    Recent Results (from the past 240 hour(s))  Culture, blood (x 2)     Status: None (Preliminary result)   Collection Time: 11/24/19  2:12 PM   Specimen: BLOOD RIGHT HAND  Result Value Ref Range Status   Specimen Description   Final    BLOOD RIGHT HAND Performed at [facility] [address], [place], [place] [address]    Special Requests   Final    BOTTLES DRAWN AEROBIC AND ANAEROBIC Blood Culture results may not be optimal due to an inadequate volume of blood received in culture bottles Performed at [facility] [address], [place], [place] [address]    Culture   Final    NO GROWTH < 12 HOURS Performed at [facility] [address] [place], [place] [address]    Report Status PENDING  Incomplete  Respiratory Panel by RT PCR (Flu A&B, Covid) - Urine, Clean Catch     Status: None   Collection Time: 11/24/19  2:12 PM   Specimen: Urine, Clean Catch  Result Value Ref Range Status   SARS Coronavirus 2 by RT PCR NEGATIVE  NEGATIVE Final    Comment: (NOTE) SARS-CoV-2 target nucleic acids are NOT DETECTED. The SARS-CoV-2 RNA is generally detectable in upper respiratoy specimens during the acute phase of infection. The lowest concentration of SARS-CoV-2 viral copies this assay can detect is 131 copies/mL. A negative result does not preclude SARS-Cov-2 infection and should not be used as the sole basis for treatment or other patient management decisions. A negative result may occur with  improper specimen collection/handling, submission of specimen other than nasopharyngeal swab, presence of viral mutation(s) within the areas targeted by this assay, and inadequate number of viral copies (<131 copies/mL). A negative result must be combined with clinical observations, patient history, and epidemiological information. The expected result is Negative. Fact  Sheet for Patients:  PinkCheek.be Fact Sheet for Healthcare Providers:  GravelBags.it This test is not yet ap proved or cleared by the [place] FDA and  has been authorized for detection and/or diagnosis of SARS-CoV-2 by FDA under an Emergency Use Authorization (EUA). This EUA will remain  in effect (meaning this test can be used) for the duration of the COVID-19 declaration under Section 564(b)(1) of the Act, 21 U.S.C. section 360bbb-3(b)(1), unless the authorization is terminated or revoked sooner.    Influenza A by PCR NEGATIVE NEGATIVE Final   Influenza B by PCR NEGATIVE NEGATIVE Final    Comment: (NOTE) The Xpert Xpress SARS-CoV-2/FLU/RSV assay is intended as an aid in  the diagnosis of influenza from Nasopharyngeal swab specimens and  should not be used as a sole basis for treatment. Nasal washings and  aspirates are unacceptable for Xpert Xpress SARS-CoV-2/FLU/RSV  testing. Fact Sheet for Patients: PinkCheek.be Fact Sheet for Healthcare  Providers: GravelBags.it This test is not yet approved or cleared by the [place] FDA and  has been authorized for detection and/or diagnosis of SARS-CoV-2 by  FDA under an Emergency Use Authorization (EUA). This EUA will remain  in effect (meaning this test can be used) for the duration of the  Covid-19 declaration under Section 564(b)(1) of the Act, 21  U.S.C. section 360bbb-3(b)(1), unless the authorization is  terminated or revoked. Performed at [facility], [place] [address], [place], [place] [address]   Culture, blood (x 2)     Status: None (Preliminary result)   Collection Time: 11/24/19  2:33 PM   Specimen: BLOOD  Result Value Ref Range Status   Specimen Description   Final    BLOOD LEFT ANTECUBITAL Performed at [facility] [address], [place], [place] [address]    Special Requests   Final    BOTTLES DRAWN AEROBIC AND ANAEROBIC Blood Culture results may not be optimal due to an inadequate volume of blood received in culture bottles Performed at [facility] [address] [place], [place] [address]    Culture   Final    NO GROWTH < 12 HOURS Performed at [facility] [address] [place], [place] [address]    Report Status PENDING  Incomplete         Radiology Studies: CT Head Wo Contrast  Result Date: 11/24/2019 CLINICAL DATA:  Altered mental status EXAM: CT HEAD WITHOUT CONTRAST TECHNIQUE: Contiguous axial images were obtained from the base of the skull through the vertex without intravenous contrast. COMPARISON:  Correlation made with MRI brain 2016 FINDINGS: Brain: There is no acute intracranial hemorrhage, mass-effect, or edema. Gray-white differentiation is preserved. There is no extra-axial fluid collection. Prominence of the ventricles and sulci reflects generalized parenchymal volume loss. Disproportionate ventricular prominence is favored to be on an ex  vacuo basis. Patchy and confluent areas of hypoattenuation in the supratentorial white matter are nonspecific but probably reflect advanced chronic microvascular ischemic changes. These findings are similar to prior MRI. Vascular: There is atherosclerotic calcification at the skull base. Skull: Calvarium is unremarkable. Sinuses/Orbits: No acute finding. Other: None. IMPRESSION: No acute intracranial abnormality. Advanced chronic microvascular ischemic changes. Electronically Signed   By: Macy Mis M.D.   On: 11/24/2019 15:05   NM Pulmonary Perf and Vent  Result Date: 11/25/2019 CLINICAL DATA:  Back pain for several weeks. Elevated D-dimer levels. High clinical suspicion of pulmonary embolism. EXAM: NUCLEAR MEDICINE VENTILATION - PERFUSION LUNG SCAN TECHNIQUE: Ventilation  images were obtained in multiple projections using inhaled aerosol Tc-46m DTPA. Perfusion images were obtained in multiple projections after intravenous injection of Tc-44m MAA. RADIOPHARMACEUTICALS:  42.0 mCi of Tc-74m DTPA aerosol inhalation and 1.64 mCi Tc13m MAA IV COMPARISON:  Portable chest radiographs 11/25/2019 and 11/24/2019. FINDINGS: Ventilation: Multiple small to moderate ventilation defects in both lungs, right greater than left. Perfusion: There are multiple small to moderate perfusion defects in both lungs, nearly precisely matched to the ventilatory defects. No ventilation/perfusion mismatch is identified. The lungs are clear on today's radiographs. IMPRESSION: Multiple small to moderate matched ventilation-perfusion defects in both lungs, nondiagnostic (low or intermediate probability) for pulmonary embolism. Recommend further evaluation with chest CTA and/or lower extremity Doppler ultrasound. Electronically Signed   By: Richardean Sale M.D.   On: 11/25/2019 13:55   DG CHEST PORT 1 VIEW  Result Date: 11/25/2019 CLINICAL DATA:  Dyspnea. EXAM: PORTABLE CHEST 1 VIEW COMPARISON:  November 24, 2019. FINDINGS: The heart size  and mediastinal contours are within normal limits. Both lungs are clear. No pneumothorax or pleural effusion is noted. The visualized skeletal structures are unremarkable. IMPRESSION: No active disease. Electronically Signed   By: Marijo Conception M.D.   On: 11/25/2019 11:47   DG Chest Port 1 View  Result Date: 11/24/2019 CLINICAL DATA:  Progressive weakness.  Back pain. EXAM: PORTABLE CHEST 1 VIEW COMPARISON:  Chest x-ray dated 04/25/2007 FINDINGS: The heart size and mediastinal contours are within normal limits. Both lungs are clear. The visualized skeletal structures are unremarkable. IMPRESSION: Normal exam. Electronically Signed   By: Lorriane Shire M.D.   On: 11/24/2019 14:43        Scheduled Meds: . vitamin C  500 mg Oral Daily  . aspirin EC  81 mg Oral Daily  . atorvastatin  20 mg Oral Daily  . donepezil  5 mg Oral BID  . insulin aspart  0-9 Units Subcutaneous TID WC   Continuous Infusions: . sodium chloride 75 mL/hr at 11/24/19 2018  . heparin 900 Units/hr (11/25/19 0620)     LOS: 1 day    Time spent: 39 minutes spent on chart review, discussion with nursing staff, consultants, updating family and interview/physical exam; more than 50% of that time was spent in counseling and/or coordination of care.    Tenessa Marsee J [place] DO [facility] 11/25/2019, 2:40 PM

## 2019-11-25 NOTE — Progress Notes (Signed)
Pt arrived to unit via stretcher room 1511. Alert x 1 currently. Oriented to callbell. No complaint of pain. Initial assessment completed. Will continue to monitor

## 2019-11-25 NOTE — Progress Notes (Signed)
Bilateral lower extremity venous duplex has been completed. Preliminary results can be found in CV Proc through chart review.  Results were given to Dr. [place]  11/25/19 3:30 PM Carlos Levering RVT

## 2019-11-25 NOTE — Progress Notes (Addendum)
ANTICOAGULATION CONSULT NOTE  Pharmacy Consult for Heparin Indication: R/O pulmonary embolism  No Known Allergies  Patient Measurements: Weight: 202 lb 13.2 oz (92 kg)  Height: 70 inches Heparin Dosing Weight: actual body weight  Vital Signs: Temp: 98.9 F (37.2 C) (01/11 1313) Temp Source: Oral (01/11 1313) BP: 117/63 (01/11 1313) Pulse Rate: 72 (01/11 1313)  Labs: Recent Labs    11/24/19 1412 11/24/19 1730 11/25/19 0454 11/25/19 1405  HGB 16.3  --  14.8  --   HCT 50.0  --  45.4  --   PLT 141*  --  123*  --   APTT  --  31 171*  --   LABPROT  --  13.7 14.3  --   INR  --  1.1 1.1  --   HEPARINUNFRC  --   --  1.06* 0.62  CREATININE 2.95*  --  2.72*  --   CKTOTAL  --  1,144*  --   --   TROPONINIHS 2,126* 1,642* 710*  --     CrCl cannot be calculated (Unknown ideal weight.).  Medications:  No oral anticoagulation PTA  Assessment:  79 yr male presents with back pain and AMS  Elevated D-dimer  PMH significant for DM, HTN, HLD, prevoius bladder cancer  Pharmacy consulted to dose IV heparin for presumed pulmonary embolism  Today, 11/25/2019:  Hgb stable; Plt slightly lower but remains > 100k  Trop trending down  heparin therapeutic at 0.62 after rate decreased to 900 units/hr,   No bleeding reported VQ 1/11: Multiple small to moderate matched ventilation-perfusion defects in both lungs, nondiagnostic (low or intermediate probability) for PE> rec CTA or LE doppler No CTA 2nd AKI for now   Goal of Therapy:  Heparin level 0.3-0.7 units/ml Monitor platelets by anticoagulation protocol: Yes   Plan:   continue heparin at 900 units/hr  confirmatory heparin level in 8 hrs  Follow heparin level and CBC daily while on IV heparin  [name], Pharm.D [phone] 11/25/2019 3:16 PM

## 2019-11-26 ENCOUNTER — Inpatient Hospital Stay (HOSPITAL_COMMUNITY): Payer: Medicare PPO

## 2019-11-26 LAB — GLUCOSE, CAPILLARY
Glucose-Capillary: 129 mg/dL — ABNORMAL HIGH (ref 70–99)
Glucose-Capillary: 165 mg/dL — ABNORMAL HIGH (ref 70–99)
Glucose-Capillary: 128 mg/dL — ABNORMAL HIGH (ref 70–99)
Glucose-Capillary: 107 mg/dL — ABNORMAL HIGH (ref 70–99)
Glucose-Capillary: 137 mg/dL — ABNORMAL HIGH (ref 70–99)
Glucose-Capillary: 196 mg/dL — ABNORMAL HIGH (ref 70–99)

## 2019-11-26 LAB — BASIC METABOLIC PANEL
Anion gap: 11 (ref 5–15)
BUN: 51 mg/dL — ABNORMAL HIGH (ref 8–23)
CO2: 22 mmol/L (ref 22–32)
Calcium: 9.1 mg/dL (ref 8.9–10.3)
Chloride: 107 mmol/L (ref 98–111)
Creatinine, Ser: 2.99 mg/dL — ABNORMAL HIGH (ref 0.61–1.24)
GFR calc Af Amer: 22 mL/min — ABNORMAL LOW (ref >60)
GFR calc non Af Amer: 19 mL/min — ABNORMAL LOW (ref >60)
Glucose, Bld: 119 mg/dL — ABNORMAL HIGH (ref 70–99)
Potassium: 4.7 mmol/L (ref 3.5–5.1)
Sodium: 140 mmol/L (ref 135–145)

## 2019-11-26 LAB — CBC
HCT: 44.3 % (ref 39.0–52.0)
Hemoglobin: 14.4 g/dL (ref 13.0–17.0)
MCH: 29.3 pg (ref 26.0–34.0)
MCHC: 32.5 g/dL (ref 30.0–36.0)
MCV: 90 fL (ref 80.0–100.0)
Platelets: 118 10*3/uL — ABNORMAL LOW (ref 150–400)
RBC: 4.92 MIL/uL (ref 4.22–5.81)
RDW: 12.7 % (ref 11.5–15.5)
WBC: 6.6 10*3/uL (ref 4.0–10.5)
nRBC: 0 % (ref 0.0–0.2)

## 2019-11-26 LAB — HEPARIN LEVEL (UNFRACTIONATED): Heparin Unfractionated: 0.49 IU/mL (ref 0.30–0.70)

## 2019-11-26 MED ORDER — APIXABAN 5 MG PO TABS: 5.0000 mg | ORAL_TABLET | Freq: Two times a day (BID) | ORAL | Status: DC

## 2019-11-26 MED ORDER — HALOPERIDOL LACTATE 5 MG/ML IJ SOLN: 1.0000 mg | Freq: Four times a day (QID) | INTRAMUSCULAR | Status: DC | PRN

## 2019-11-26 MED ORDER — APIXABAN 5 MG PO TABS
10.0000 mg | ORAL_TABLET | Freq: Two times a day (BID) | ORAL | Status: DC
  Administered 2019-11-26 – 2019-11-30 (×10): 10 mg via ORAL
  Filled 2019-11-26 (×10): qty 2

## 2019-11-26 MED ORDER — MELATONIN 3 MG PO TABS
6.0000 mg | ORAL_TABLET | Freq: Every day | ORAL | Status: DC
  Administered 2019-11-26 – 2019-11-30 (×5): 6 mg via ORAL
  Filled 2019-11-26 (×5): qty 2

## 2019-11-26 NOTE — Progress Notes (Signed)
   Vital Signs MEWS/VS Documentation      11/26/2019 1424 11/26/2019 1436 11/26/2019 1607 11/26/2019 1616   MEWS Score:  2  2  1  1    MEWS Score Color:  Yellow  Yellow  Green  Green   Resp:  20  --  (!) 21  --   Pulse:  (!) 101  --  91  --   BP:  139/70  --  132/76  --   Temp:  (!) 101 F (38.3 C)  --  99.5 F (37.5 C)  --   O2 Device:  Room Air  --  Room Air  --   Level of Consciousness:  --  Alert  --  Alert           Imogene Burn 11/26/2019,4:17 PM Pt is laying in bed resting, watching tv, no visible distress. Recheck temp. Down to 99.5, applied cold compress and will continue to monitor under the yellow mews guidelines

## 2019-11-26 NOTE — Progress Notes (Addendum)
PROGRESS NOTE    Tony Fox  [id number] DOB: [birth year] DOA: 11/24/2019 PCP: Willey Blade, MD    Brief Narrative:   Tony Fox is a 79 y.o. male with medical history significant of hypertension, hyperlipidemia, dementia, type 2 diabetes, vitamin D deficiency who presented to [facility] ED from home with his wife due to progressive generalized weakness over 3 to 4 days.  History obtained from patient's wife by telephone and from the chart, given patient's dementia he is unable to provide history.  Wife reports that at baseline patient will get up on his own to go to the bathroom (notes he is incontinent but sometimes uses the restroom) but had not been getting up for the past couple days.  Yesterday, patient's son actually had to pick him up in order to get him up.  Wife reports also had a poor appetite recently, has complained of back pain without any trauma or other injury, and wife notes he has been somewhat short of breath with ambulation.  Patient has not had any fevers or chills, nausea, vomiting, diarrhea, cough or known sick contacts.  At baseline patient ambulates with a cane, wife recently got him a walker.  Per report, patient had been seen by PCP recently for increased leg swelling.  His HCTZ was changed to Lasix.  Wife states patient has no known history of blood clots, but is rather sedentary.  Per ED physician, patient had complained of right-sided chest pain during ED encounter.  During my encounter patient denied any chest pain.  Wife stated patient had not complained of chest pain at home.  ED Course: Temp 99.9F, O2 sat 92% to 96% on room air, mildly tachypneic respiratory rate 22.  Labs notable for creatinine 2.95, BNP normal 21.7, highly sensitive troponin elevated but trended downward (2126--> 1642), total CK elevated 1144, lactic acid elevated 2.5.  D-dimer elevated 17.2.  UA negative for infection.  Covid PCR negative.  Influenza A/B negative. CBC normal with  the exception of mild thrombocytopenia with platelets 141.  Noncontrast head CT showed only advanced chronic ischemic changes.  Chest x-ray was normal. ECG was normal sinus rhythm at 77 bpm with nonspecific T wave changes.  CTA chest was not performed to evaluate for pulmonary embolism due to acute kidney injury. Patient was started empirically on heparin gtt for presumed PE more likely than ACS and IV fluids.  Patient admitted as inpatient for further evaluation management.   Assessment & Plan:   Principal Problem:   Generalized weakness Active Problems:   Memory loss   Essential hypertension   Hyperlipidemia   Uncomplicated type 2 diabetes mellitus (HCC)   Elevated troponin level not due to acute coronary syndrome   AKI (acute kidney injury) ([place])   Elevated d-dimer   Lactic acidosis   Thrombocytopenia (HCC)   Generalized weakness Patient presenting to the ED with progressive weakness, poor appetite.  At baseline ambulatory with a cane.  No infectious etiology elucidated.  No focal neurologic deficits. --Pending PT evaluation --Continue fall precautions  Elevated troponin level not due to acute coronary syndrome  hsTrop significant elevated in the ED, 2126.  EKG with nonspecific T wave changes.  Patient has denied any active chest pain during his hospitalization or prior to admission. --hsTrop 2,126-->1,642-->710-->422-->383 --Transition heparin drip to Eliquis today --Continue aspirin --Pending echocardiogram --Continue to follow troponin level to ensure continues to trend down  Acute right lower extremity DVT Elevated d-dimer  D-dimer elevated at 17.20.  No previous  history of DVT/PE, but is sedentary most of the time.  Unable to obtain CTA chest secondary to acute renal failure vs underlying CKD.  VQ scan with multiple small to moderate matched ventilation/perfusion defects in both lungs which is nondiagnostic for PE.  Bilateral lower extremity duplex ultrasound positive  for acute DVT right femoral vein, right popliteal vein, and right posterior tibial veins.  TTE with EF 0000000, grade 1 diastolic dysfunction, mild/moderate aortic valve sclerosis without any evidence of aortic stenosis, IVC normal in size.   --Transition heparin drip to Eliquis today  AKI on CKD stage IIIa  Etiology likely prerenal azotemia secondary to poor oral intake.  Creatinine was noted to be elevated 2.95 on admission, chart review notable for creatinine 1.41 in 2017 with a GFR of 58.  Status post 1 L LR bolus in ED. Renal ultrasound with small size kidneys with normal echogenicity of the renal cortex, no obstruction/mass.  Bilateral renal calculi noted. --Cr 2.95-->2.72-->2.99 --Continue NS at 75 mL's per hour --Holding home furosemide 20 mg daily --Avoid nephrotoxins, renally dose all medications --Strict I's and O's --Follow BMP daily  Lactic acidosis: Resolved Lactic acid 2.5 on admission, no evidence of infection.  Suspect elevation secondary to acute renal failure as above. --LA 2.5-->1.7  Thrombocytopenia Platelet count 141K on admission.  Unknown etiology/chronicity. --Continue monitor CBC closely while on heparin drip  HLD: Continue atorvastatin 20 mg p.o. daily  Dementia: --Continue donepezil 5 mg p.o. twice daily --Melatonin 6 mg p.o. nightly --Haldol prn agitation/sundowning  Type 2 diabetes mellitus On glipizide-Metformin 2.5-500 mg p.o. daily at home. --Hold oral hypoglycemic while inpatient --Insulin sliding scale for coverage --CBGs qAC/HS   DVT prophylaxis: Heparin drip-->Eliquis today Code Status: DNR Family Communication: Updated patient's spouse via telephone this morning Disposition Plan: Continue inpatient, may need SNF versus home health, awaiting further determination by therapy.  Consultants:   None  Procedures:   VQ scan: Indeterminant  TTE: EF 55-60%.  G1DD.  Normal RV systolic function.  Normal IVC in size.  Bilateral lower  extremity venous duplex ultrasound: Positive for right lower extremity DVT  Renal ultrasound: Small kidneys, normal echogenicity renal cortex, no obstruction/mass  Antimicrobials:   None   Subjective: Patient seen and examined bedside, resting comfortably.  Significant sundowning overnight.  Eating breakfast, remains pleasantly confused.  No complaints this morning.  Specifically denies headache, no fever/chills/night sweats, no nausea/vomiting/diarrhea, no chest pain, palpitations, no dizziness, no abdominal pain, no cough/congestion.  Updated patient spouse via telephone this morning.  No acute events overnight per nursing staff.  Objective: Vitals:   11/25/19 2023 11/26/19 0500 11/26/19 0602 11/26/19 1424  BP: 102/63  (!) 142/82 139/70  Pulse: 76  65 (!) 101  Resp: 18  18 20   Temp: 98.1 F (36.7 C)  98.2 F (36.8 C) (!) 101 F (38.3 C)  TempSrc: Oral  Oral Oral  SpO2: 94%  97% 93%  Weight:  92.3 kg      Intake/Output Summary (Last 24 hours) at 11/26/2019 1424 Last data filed at 11/26/2019 0908 [place] per 24 hour  Intake 2843.68 ml  Output 1000 ml  Net 1843.68 ml   Filed Weights   11/24/19 1915 11/25/19 0553 11/26/19 0500  Weight: 86.2 kg 92 kg 92.3 kg    Examination:  General exam: Appears calm and comfortable, pleasantly confused Respiratory system: Clear to auscultation. Respiratory effort normal.  Oxygenating well on room air Cardiovascular system: S1 & S2 heard, RRR. No JVD, murmurs, rubs, gallops or clicks.  No pedal edema. Gastrointestinal system: Abdomen is nondistended, soft and nontender. No organomegaly or masses felt. Normal bowel sounds heard. Central nervous system: Alert, not oriented to person/place/time. No focal neurological deficits. Extremities: Symmetric 5 x 5 power. Skin: No rashes, lesions or ulcers Psychiatry: Judgement and insight appear poor.  Depressed mood & flat affect.     Data Reviewed: I have personally reviewed following labs and  imaging studies  CBC: Recent Labs  Lab 11/24/19 1412 11/25/19 0454 11/26/19 0458  WBC 7.6 5.8 6.6  NEUTROABS 5.2  --   --   HGB 16.3 14.8 14.4  HCT 50.0 45.4 44.3  MCV 89.4 90.8 90.0  PLT 141* 123* [id number]*   Basic Metabolic Panel: Recent Labs  Lab 11/24/19 1412 11/25/19 0454 11/26/19 0458  NA 141 142 140  K 3.9 4.4 4.7  CL 104 107 107  CO2 22 23 22   GLUCOSE 114* 132* 119*  BUN 49* 46* 51*  CREATININE 2.95* 2.72* 2.99*  CALCIUM 9.6 9.0 9.1   GFR: CrCl cannot be calculated (Unknown ideal weight.). Liver Function Tests: Recent Labs  Lab 11/24/19 1412  AST 46*  ALT 27  ALKPHOS 48  BILITOT 1.2  PROT 8.0  ALBUMIN 4.2   No results for input(s): LIPASE, AMYLASE in the last 168 hours. No results for input(s): AMMONIA in the last 168 hours. Coagulation Profile: Recent Labs  Lab 11/24/19 1730 11/25/19 0454  INR 1.1 1.1   Cardiac Enzymes: Recent Labs  Lab 11/24/19 1730  CKTOTAL 1,144*   BNP (last 3 results) No results for input(s): PROBNP in the last 8760 hours. HbA1C: Recent Labs    11/25/19 0454  HGBA1C 8.7*   CBG: Recent Labs  Lab 11/25/19 1309 11/25/19 1656 11/25/19 2152 11/26/19 0739 11/26/19 1144  GLUCAP 147* 129* 165* 128* 107*   Lipid Profile: No results for input(s): CHOL, HDL, LDLCALC, TRIG, CHOLHDL, LDLDIRECT in the last 72 hours. Thyroid Function Tests: No results for input(s): TSH, T4TOTAL, FREET4, T3FREE, THYROIDAB in the last 72 hours. Anemia Panel: No results for input(s): VITAMINB12, FOLATE, FERRITIN, TIBC, IRON, RETICCTPCT in the last 72 hours. Sepsis Labs: Recent Labs  Lab 11/24/19 1412 11/25/19 0454  LATICACIDVEN 2.5* 1.7    Recent Results (from the past 240 hour(s))  Urine culture     Status: Abnormal   Collection Time: 11/24/19  2:12 PM   Specimen: Urine, Random  Result Value Ref Range Status   Specimen Description   Final    URINE, RANDOM Performed at [facility] [address],  [place], [place] [address]    Special Requests   Final    NONE Performed at [facility], [place] [address] [place], [place] [address]    Culture MULTIPLE SPECIES PRESENT, SUGGEST RECOLLECTION (A)  Final   Report Status 11/25/2019 FINAL  Final  Culture, blood (x 2)     Status: None (Preliminary result)   Collection Time: 11/24/19  2:12 PM   Specimen: BLOOD RIGHT HAND  Result Value Ref Range Status   Specimen Description   Final    BLOOD RIGHT HAND Performed at [facility] [address] [place], [place] [address]    Special Requests   Final    BOTTLES DRAWN AEROBIC AND ANAEROBIC Blood Culture results may not be optimal due to an inadequate volume of blood received in culture bottles Performed at [facility] [address], [place],  [address]    Culture   Final  NO GROWTH 2 DAYS Performed at [facility], [place] [address] [place], [place] [address]    Report Status PENDING  Incomplete  Respiratory Panel by RT PCR (Flu A&B, Covid) - Urine, Clean Catch     Status: None   Collection Time: 11/24/19  2:12 PM   Specimen: Urine, Clean Catch  Result Value Ref Range Status   SARS Coronavirus 2 by RT PCR NEGATIVE NEGATIVE Final    Comment: (NOTE) SARS-CoV-2 target nucleic acids are NOT DETECTED. The SARS-CoV-2 RNA is generally detectable in upper respiratoy specimens during the acute phase of infection. The lowest concentration of SARS-CoV-2 viral copies this assay can detect is 131 copies/mL. A negative result does not preclude SARS-Cov-2 infection and should not be used as the sole basis for treatment or other patient management decisions. A negative result may occur with  improper specimen collection/handling, submission of specimen other than nasopharyngeal swab, presence of viral mutation(s) within the areas targeted by this assay, and inadequate number of viral copies (<131 copies/mL). A negative result must be  combined with clinical observations, patient history, and epidemiological information. The expected result is Negative. Fact Sheet for Patients:  PinkCheek.be Fact Sheet for Healthcare Providers:  GravelBags.it This test is not yet ap proved or cleared by the [place] FDA and  has been authorized for detection and/or diagnosis of SARS-CoV-2 by FDA under an Emergency Use Authorization (EUA). This EUA will remain  in effect (meaning this test can be used) for the duration of the COVID-19 declaration under Section 564(b)(1) of the Act, 21 U.S.C. section 360bbb-3(b)(1), unless the authorization is terminated or revoked sooner.    Influenza A by PCR NEGATIVE NEGATIVE Final   Influenza B by PCR NEGATIVE NEGATIVE Final    Comment: (NOTE) The Xpert Xpress SARS-CoV-2/FLU/RSV assay is intended as an aid in  the diagnosis of influenza from Nasopharyngeal swab specimens and  should not be used as a sole basis for treatment. Nasal washings and  aspirates are unacceptable for Xpert Xpress SARS-CoV-2/FLU/RSV  testing. Fact Sheet for Patients: PinkCheek.be Fact Sheet for Healthcare Providers: GravelBags.it This test is not yet approved or cleared by the [place] FDA and  has been authorized for detection and/or diagnosis of SARS-CoV-2 by  FDA under an Emergency Use Authorization (EUA). This EUA will remain  in effect (meaning this test can be used) for the duration of the  Covid-19 declaration under Section 564(b)(1) of the Act, 21  U.S.C. section 360bbb-3(b)(1), unless the authorization is  terminated or revoked. Performed at [facility], [place] [address], [place], [place] [address]   Culture, blood (x 2)     Status: None (Preliminary result)   Collection Time: 11/24/19  2:33 PM   Specimen: BLOOD  Result Value Ref Range Status   Specimen Description    Final    BLOOD LEFT ANTECUBITAL Performed at [facility] [address] [place], [place] [address]    Special Requests   Final    BOTTLES DRAWN AEROBIC AND ANAEROBIC Blood Culture results may not be optimal due to an inadequate volume of blood received in culture bottles Performed at [facility] [address] [place], [place] [address]    Culture   Final    NO GROWTH 2 DAYS Performed at [facility] [address], [place], [place] [address]    Report Status PENDING  Incomplete         Radiology Studies: CT Head  Wo Contrast  Result Date: 11/24/2019 CLINICAL DATA:  Altered mental status EXAM: CT HEAD WITHOUT CONTRAST TECHNIQUE: Contiguous axial images were obtained from the base of the skull through the vertex without intravenous contrast. COMPARISON:  Correlation made with MRI brain 2016 FINDINGS: Brain: There is no acute intracranial hemorrhage, mass-effect, or edema. Gray-white differentiation is preserved. There is no extra-axial fluid collection. Prominence of the ventricles and sulci reflects generalized parenchymal volume loss. Disproportionate ventricular prominence is favored to be on an ex vacuo basis. Patchy and confluent areas of hypoattenuation in the supratentorial white matter are nonspecific but probably reflect advanced chronic microvascular ischemic changes. These findings are similar to prior MRI. Vascular: There is atherosclerotic calcification at the skull base. Skull: Calvarium is unremarkable. Sinuses/Orbits: No acute finding. Other: None. IMPRESSION: No acute intracranial abnormality. Advanced chronic microvascular ischemic changes. Electronically Signed   By: Macy Mis M.D.   On: 11/24/2019 15:05   US RENAL  Result Date: 11/26/2019 CLINICAL DATA:  Acute renal failure.  History of bladder tumor. EXAM: RENAL / URINARY TRACT ULTRASOUND COMPLETE COMPARISON:  CT abdomen pelvis 02/12/2018 FINDINGS: Right Kidney:  Renal measurements: 9.9 x 4.8 x 5.0 cm. = volume: 123 mL. Right upper pole renal calculus 6 mm. Left Kidney: Renal measurements: 9.6 x 4.5 x 4.5 cm = volume: 101 mL. Left midpole cyst 1 cm. Left lower pole renal calculus. Bladder: Appears normal for degree of bladder distention. Other: None. IMPRESSION: Small sized kidneys with normal echogenicity of the renal cortex. No obstruction or mass. Bilateral renal calculi Electronically Signed   By: Franchot Gallo M.D.   On: 11/26/2019 13:34   NM Pulmonary Perf and Vent  Result Date: 11/25/2019 CLINICAL DATA:  Back pain for several weeks. Elevated D-dimer levels. High clinical suspicion of pulmonary embolism. EXAM: NUCLEAR MEDICINE VENTILATION - PERFUSION LUNG SCAN TECHNIQUE: Ventilation images were obtained in multiple projections using inhaled aerosol Tc-66m DTPA. Perfusion images were obtained in multiple projections after intravenous injection of Tc-31m MAA. RADIOPHARMACEUTICALS:  42.0 mCi of Tc-53m DTPA aerosol inhalation and 1.64 mCi Tc21m MAA IV COMPARISON:  Portable chest radiographs 11/25/2019 and 11/24/2019. FINDINGS: Ventilation: Multiple small to moderate ventilation defects in both lungs, right greater than left. Perfusion: There are multiple small to moderate perfusion defects in both lungs, nearly precisely matched to the ventilatory defects. No ventilation/perfusion mismatch is identified. The lungs are clear on today's radiographs. IMPRESSION: Multiple small to moderate matched ventilation-perfusion defects in both lungs, nondiagnostic (low or intermediate probability) for pulmonary embolism. Recommend further evaluation with chest CTA and/or lower extremity Doppler ultrasound. Electronically Signed   By: Richardean Sale M.D.   On: 11/25/2019 13:55   DG CHEST PORT 1 VIEW  Result Date: 11/25/2019 CLINICAL DATA:  Dyspnea. EXAM: PORTABLE CHEST 1 VIEW COMPARISON:  November 24, 2019. FINDINGS: The heart size and mediastinal contours are within normal  limits. Both lungs are clear. No pneumothorax or pleural effusion is noted. The visualized skeletal structures are unremarkable. IMPRESSION: No active disease. Electronically Signed   By: Marijo Conception M.D.   On: 11/25/2019 11:47   DG Chest Port 1 View  Result Date: 11/24/2019 CLINICAL DATA:  Progressive weakness.  Back pain. EXAM: PORTABLE CHEST 1 VIEW COMPARISON:  Chest x-ray dated 04/25/2007 FINDINGS: The heart size and mediastinal contours are within normal limits. Both lungs are clear. The visualized skeletal structures are unremarkable. IMPRESSION: Normal exam. Electronically Signed   By: Lorriane Shire M.D.   On: 11/24/2019 14:43   ECHOCARDIOGRAM COMPLETE  Result Date: 11/25/2019   ECHOCARDIOGRAM REPORT   Patient Name:   Tony Fox Date of Exam: 11/25/2019 Medical Rec #:  [id number]        Height:       70.0 in Accession #:    [id number]       Weight:       202.8 lb Date of Birth:  05-12-1941        BSA:          2.10 m Patient Age:    27 years         BP:           101/59 mmHg Patient Gender: M                HR:           73 bpm. Exam Location:  Inpatient Procedure: 2D Echo, Cardiac Doppler and Color Doppler Indications:    Elevated troponin.  History:        Patient has no prior history of Echocardiogram examinations.                 Risk Factors:Hypertension, Diabetes, Dyslipidemia and                 Non-Smoker.  Sonographer:    Paulita Fujita RDCS Referring Phys: [id number] [facility]  Sonographer Comments: Image acquisition challenging due to patient body habitus. IMPRESSIONS  1. Left ventricular ejection fraction, by visual estimation, is 55 to 60%. The left ventricle has normal function. There is no left ventricular hypertrophy.  2. Left ventricular diastolic parameters are consistent with Grade I diastolic dysfunction (impaired relaxation).  3. The left ventricle has no regional wall motion abnormalities.  4. Global right ventricle has normal systolic function.The right ventricular  size is normal. No increase in right ventricular wall thickness.  5. Left atrial size was normal.  6. Right atrial size was normal.  7. The mitral valve is normal in structure. No evidence of mitral valve regurgitation. No evidence of mitral stenosis.  8. The tricuspid valve is normal in structure.  9. The aortic valve is normal in structure. Aortic valve regurgitation is not visualized. Mild to moderate aortic valve sclerosis/calcification without any evidence of aortic stenosis. 10. Pulmonic regurgitation is mild. 11. The pulmonic valve was normal in structure. Pulmonic valve regurgitation is mild. 12. Moderately elevated pulmonary artery systolic pressure. 13. The tricuspid regurgitant velocity is 3.21 m/s, and with an assumed right atrial pressure of 8 mmHg, the estimated right ventricular systolic pressure is moderately elevated at 49.2 mmHg. 14. The inferior vena cava is normal in size with greater than 50% respiratory variability, suggesting right atrial pressure of 3 mmHg. FINDINGS  Left Ventricle: Left ventricular ejection fraction, by visual estimation, is 55 to 60%. The left ventricle has normal function. The left ventricle has no regional wall motion abnormalities. There is no left ventricular hypertrophy. Left ventricular diastolic parameters are consistent with Grade I diastolic dysfunction (impaired relaxation). Normal left atrial pressure. Right Ventricle: The right ventricular size is normal. No increase in right ventricular wall thickness. Global RV systolic function is has normal systolic function. The tricuspid regurgitant velocity is 3.21 m/s, and with an assumed right atrial pressure  of 8 mmHg, the estimated right ventricular systolic pressure is moderately elevated at 49.2 mmHg. Left Atrium: Left atrial size was normal in size. Right Atrium: Right atrial size was normal in size Pericardium: There is no evidence of pericardial effusion. Mitral Valve: The  mitral valve is normal in structure. No  evidence of mitral valve regurgitation. No evidence of mitral valve stenosis by observation. Tricuspid Valve: The tricuspid valve is normal in structure. Tricuspid valve regurgitation is mild. Aortic Valve: The aortic valve is normal in structure.. There is moderate thickening and moderate calcification of the aortic valve. Aortic valve regurgitation is not visualized. Mild to moderate aortic valve sclerosis/calcification is present, without any evidence of aortic stenosis. There is moderate thickening of the aortic valve. There is moderate calcification of the aortic valve. Pulmonic Valve: The pulmonic valve was normal in structure. Pulmonic valve regurgitation is mild. Pulmonic regurgitation is mild. Aorta: The aortic root, ascending aorta and aortic arch are all structurally normal, with no evidence of dilitation or obstruction. Venous: The inferior vena cava was not well visualized. The inferior vena cava is normal in size with greater than 50% respiratory variability, suggesting right atrial pressure of 3 mmHg. IAS/Shunts: No atrial level shunt detected by color flow Doppler. There is no evidence of a patent foramen ovale. No ventricular septal defect is seen or detected. There is no evidence of an atrial septal defect.  LEFT VENTRICLE PLAX 2D LVIDd:         4.40 cm       Diastology LVIDs:         3.30 cm       LV e' lateral:   11.00 cm/s LV PW:         0.90 cm       LV E/e' lateral: 3.0 LV IVS:        0.90 cm       LV e' medial:    6.42 cm/s LVOT diam:     1.60 cm       LV E/e' medial:  5.1 LV SV:         44 ml LV SV Index:   20.23 LVOT Area:     2.01 cm  LV Volumes (MOD) LV area d, A2C:    23.50 cm LV area d, A4C:    28.70 cm LV area s, A2C:    14.40 cm LV area s, A4C:    19.50 cm LV major d, A2C:   6.74 cm LV major d, A4C:   7.37 cm LV major s, A2C:   5.01 cm LV major s, A4C:   6.34 cm LV vol d, MOD A2C: 71.4 ml LV vol d, MOD A4C: 98.3 ml LV vol s, MOD A2C: 36.1 ml LV vol s, MOD A4C: 52.9 ml LV SV MOD  A2C:     35.3 ml LV SV MOD A4C:     98.3 ml LV SV MOD BP:      38.4 ml RIGHT VENTRICLE RV S prime:     14.30 cm/s TAPSE (M-mode): 1.6 cm LEFT ATRIUM           Index       RIGHT ATRIUM          Index LA diam:      3.50 cm 1.67 cm/m  RA Area:     8.21 cm LA Vol (A2C): 18.1 ml 8.62 ml/m  RA Volume:   12.80 ml 6.10 ml/m LA Vol (A4C): 23.0 ml 10.95 ml/m  AORTIC VALVE LVOT Vmax:   59.90 cm/s LVOT Vmean:  45.000 cm/s LVOT VTI:    0.121 m  AORTA Ao Root diam: 3.20 cm MITRAL VALVE  TRICUSPID VALVE MV Area (PHT): 2.74 cm             TR Peak grad:   41.2 mmHg MV PHT:        80.33 msec           TR Vmax:        321.00 cm/s MV Decel Time: 277 msec MV E velocity: 32.60 cm/s 103 cm/s  SHUNTS MV A velocity: 50.00 cm/s 70.3 cm/s Systemic VTI:  0.12 m MV E/A ratio:  0.65       1.5       Systemic Diam: 1.60 cm  Candee Furbish MD Electronically signed by Candee Furbish MD Signature Date/Time: 11/25/2019/3:37:48 PM    Final    VAS [place] LOWER EXTREMITY VENOUS (DVT)  Result Date: 11/25/2019  Lower Venous Study Indications: Elevated Ddimer.  Risk Factors: None identified. Limitations: Body habitus, poor ultrasound/tissue interface and patient immobility, patient positioning. Comparison Study: No prior studies. Performing Technologist: Oliver Hum RVT  Examination Guidelines: A complete evaluation includes B-mode imaging, spectral Doppler, color Doppler, and power Doppler as needed of all accessible portions of each vessel. Bilateral testing is considered an integral part of a complete examination. Limited examinations for reoccurring indications may be performed as noted.  +---------+---------------+---------+-----------+----------+--------------+ RIGHT    CompressibilityPhasicitySpontaneityPropertiesThrombus Aging +---------+---------------+---------+-----------+----------+--------------+ CFV      Full           Yes      Yes                                  +---------+---------------+---------+-----------+----------+--------------+ SFJ      Full                                                        +---------+---------------+---------+-----------+----------+--------------+ FV Prox  None                                         Acute          +---------+---------------+---------+-----------+----------+--------------+ FV Mid   None                                         Acute          +---------+---------------+---------+-----------+----------+--------------+ FV DistalPartial        Yes      Yes                  Acute          +---------+---------------+---------+-----------+----------+--------------+ PFV      Full                                                        +---------+---------------+---------+-----------+----------+--------------+ POP      Partial        Yes      Yes  Acute          +---------+---------------+---------+-----------+----------+--------------+ PTV      None                                         Acute          +---------+---------------+---------+-----------+----------+--------------+ PERO     None                                                        +---------+---------------+---------+-----------+----------+--------------+   +---------+---------------+---------+-----------+----------+--------------+ LEFT     CompressibilityPhasicitySpontaneityPropertiesThrombus Aging +---------+---------------+---------+-----------+----------+--------------+ CFV      Full           Yes      Yes                                 +---------+---------------+---------+-----------+----------+--------------+ SFJ      Full                                                        +---------+---------------+---------+-----------+----------+--------------+ FV Prox  Full                                                         +---------+---------------+---------+-----------+----------+--------------+ FV Mid   Full                                                        +---------+---------------+---------+-----------+----------+--------------+ FV DistalFull                                                        +---------+---------------+---------+-----------+----------+--------------+ PFV      Full                                                        +---------+---------------+---------+-----------+----------+--------------+ POP      Full           Yes      Yes                                 +---------+---------------+---------+-----------+----------+--------------+ PTV  Not visualized +---------+---------------+---------+-----------+----------+--------------+ PERO                                                  Not visualized +---------+---------------+---------+-----------+----------+--------------+     Summary: Right: Findings consistent with acute deep vein thrombosis involving the right femoral vein, right popliteal vein, and right posterior tibial veins. No cystic structure found in the popliteal fossa. Left: There is no evidence of deep vein thrombosis in the lower extremity. However, portions of this examination were limited- see technologist comments above. No cystic structure found in the popliteal fossa.  *See table(s) above for measurements and observations. Electronically signed by Curt Jews MD on 11/25/2019 at 5:13:39 PM.    Final         Scheduled Meds: . apixaban  10 mg Oral BID   Followed by  . [START ON 12/03/2019] apixaban  5 mg Oral BID  . vitamin C  500 mg Oral Daily  . aspirin EC  81 mg Oral Daily  . atorvastatin  20 mg Oral Daily  . donepezil  5 mg Oral BID  . insulin aspart  0-9 Units Subcutaneous TID WC  . Melatonin  6 mg Oral QHS   Continuous Infusions:    LOS: 2 days    Time spent: 34 minutes  spent on chart review, discussion with nursing staff, consultants, updating family and interview/physical exam; more than 50% of that time was spent in counseling and/or coordination of care.    Jodi Criscuolo J [place] DO [facility] 11/26/2019, 2:24 PM

## 2019-11-26 NOTE — Progress Notes (Signed)
ANTICOAGULATION CONSULT NOTE  Pharmacy Consult for Heparin Indication: R/O pulmonary embolism  No Known Allergies  Patient Measurements: Weight: 203 lb 7.8 oz (92.3 kg)  Height: 70 inches Heparin Dosing Weight: actual body weight  Vital Signs: Temp: 98.2 F (36.8 C) (01/12 0602) Temp Source: Oral (01/12 0602) BP: 142/82 (01/12 0602) Pulse Rate: 65 (01/12 0602)  Labs: Recent Labs    11/24/19 1412 11/24/19 1412 11/24/19 1730 11/25/19 0454 11/25/19 1405 11/25/19 1537 11/25/19 1724 11/25/19 2143 11/26/19 0458  HGB 16.3  --   --  14.8  --   --   --   --  14.4  HCT 50.0  --   --  45.4  --   --   --   --  44.3  PLT 141*  --   --  123*  --   --   --   --  118*  APTT  --   --  31 171*  --   --   --   --   --   LABPROT  --   --  13.7 14.3  --   --   --   --   --   INR  --   --  1.1 1.1  --   --   --   --   --   HEPARINUNFRC  --    < >  --  1.06* 0.62  --   --  0.45 0.49  CREATININE 2.95*  --   --  2.72*  --   --   --   --  2.99*  CKTOTAL  --   --  1,144*  --   --   --   --   --   --   TROPONINIHS 2,126*   < > 1,642* 710*  --  422* 383*  --   --    < > = values in this interval not displayed.    CrCl cannot be calculated (Unknown ideal weight.).  Medications:  No oral anticoagulation PTA  Assessment:  79 yr male presents with back pain and AMS  Elevated D-dimer  PMH significant for DM, HTN, HLD, prevoius bladder cancer  Pharmacy consulted to dose IV heparin for presumed pulmonary embolism  Today, 11/26/2019:  Hgb stable; Plt slightly lower but remains > 100k  Trop trending down  heparin therapeutic at 0.49 on 900 units/hr,   No bleeding reported VQ 1/11: Multiple small to moderate matched ventilation-perfusion defects in both lungs, nondiagnostic (low or intermediate probability) for PE  LE doppler + for acute R DVT 1/11 No CTA 2nd AKI   Goal of Therapy:  Heparin level 0.3-0.7 units/ml Monitor platelets by anticoagulation protocol: Yes   Plan:    continue heparin at 900 units/hr  heparin level and CBC daily while on IV heparin  F/u transition to oral anticoagulation/DOAC   [name], Pharm.D [phone] 11/26/2019 7:13 AM

## 2019-11-26 NOTE — Evaluation (Signed)
Occupational Therapy Evaluation Patient Details Name: Tony Fox MRN: [id number] DOB: [birth year] Today's Date: 11/26/2019    History of Present Illness 79 yo male admitted with weakness. Hx of dementia.  +DVT RLE   Clinical Impression   Pt was admitted for the above. At baseline, his wife assists with adls; he feeds himself, performs toileting and is able to get something from kitchen.  Pt was very limited by RLE pain.  Sat EOB and drank iced tea and performed limited exercises.  Will follow in acute setting with the goals listed below. He will benefit from SNF to regain strength/balance prior to home to reach PLOF.    Follow Up Recommendations  SNF    Equipment Recommendations  (defer to next venue)    Recommendations for Other Services       Precautions / Restrictions Precautions Precautions: Fall Restrictions Weight Bearing Restrictions: No      Mobility Bed Mobility     Rolling: Total assist;+2 for physical assistance   Supine to sit: Max assist;+2 for physical assistance Sit to supine: Total assist;+2 for physical assistance      Transfers                 General transfer comment: not attempted    Balance                                           ADL either performed or assessed with clinical judgement   ADL Overall ADL's : Needs assistance/impaired Eating/Feeding: Minimal assistance   Grooming: Set up;Minimal assistance                                 General ADL Comments: pt has assistance for adls at baseline.  Pt sat EOB with supervision.  Drank tea. Pt has a tremor, which is not new.  Doesn't interfere with self feeding per wife.       Vision         Perception     Praxis      Pertinent Vitals/Pain Pain Assessment: Faces Faces Pain Scale: Hurts worst Pain Location: R leg, especially knee Pain Descriptors / Indicators: Aching Pain Intervention(s): Limited activity within patient's  tolerance;Monitored during session;Repositioned     Hand Dominance     Extremity/Trunk Assessment Upper Extremity Assessment Upper Extremity Assessment: Overall WFL for tasks assessed           Communication Communication Communication: No difficulties   Cognition Arousal/Alertness: Awake/alert Behavior During Therapy: WFL for tasks assessed/performed Overall Cognitive Status: History of cognitive impairments - at baseline                                     General Comments       Exercises     Shoulder Instructions      Home Living Family/patient expects to be discharged to:: Unsure                                        Prior Functioning/Environment          Comments: wife has been helping pt with adls for quite some time. He could  get up to the bathroom and go to the kitchen to get what he wanted. Lately she and sons have been helping him try to get up        OT Problem List: Decreased activity tolerance;Decreased cognition;Pain(standing balance NT)      OT Treatment/Interventions: Self-care/ADL training;Patient/family education;Therapeutic activities;Cognitive remediation/compensation    OT Goals(Current goals can be found in the care plan section) Acute Rehab OT Goals OT Goal Formulation: With family(pt generally agreeable to regaining strength) Time For Goal Achievement: 12/10/19 Potential to Achieve Goals: Good ADL Goals Pt Will Transfer to Toilet: with max assist;with +2 assist;bedside commode(drop arm commode, lateral scoot) Additional ADL Goal #1: pt will perform bed mobility at mod A level in preparation for adls Additional ADL Goal #2: pt will stand with RW with max +2 assist for adls  OT Frequency: Min 2X/week   Barriers to D/C:            Co-evaluation PT/OT/SLP Co-Evaluation/Treatment: Yes Reason for Co-Treatment: For patient/therapist safety PT goals addressed during session: Mobility/safety with  mobility OT goals addressed during session: ADL's and self-care;Strengthening/ROM      AM-PAC OT "6 Clicks" Daily Activity     Outcome Measure Help from another person eating meals?: A Little Help from another person taking care of personal grooming?: A Little Help from another person toileting, which includes using toliet, bedpan, or urinal?: Total Help from another person bathing (including washing, rinsing, drying)?: A Lot Help from another person to put on and taking off regular upper body clothing?: A Lot Help from another person to put on and taking off regular lower body clothing?: Total 6 Click Score: 12   End of Session    Activity Tolerance: Patient tolerated treatment well Patient left: in bed;with call bell/phone within reach;with bed alarm set  OT Visit Diagnosis: Muscle weakness (generalized) (M62.81);Pain Pain - Right/Left: Right Pain - part of body: Knee;Leg                Time: [id number] OT Time Calculation (min): 39 min Charges:  OT General Charges $OT Visit: 1 Visit OT Evaluation $OT Eval Low Complexity: 1 Low  Alaria Oconnor S, OTR/L Acute Rehabilitation Services 11/26/2019   11/26/2019, 2:48 PM

## 2019-11-26 NOTE — Progress Notes (Signed)
   Vital Signs MEWS/VS Documentation      11/26/2019 1607 11/26/2019 1616 11/26/2019 1810 11/26/2019 1815   MEWS Score:  1  1  0  0   MEWS Score Color:  Green  Green  Green  Green   Resp:  (!) 21  --  20  --   Pulse:  91  --  88  --   BP:  132/76  --  127/79  --   Temp:  99.5 F (37.5 C)  --  98.4 F (36.9 C)  --   O2 Device:  Room Air  --  Room Air  --   Level of Consciousness:  --  Alert  --  Alert           Imogene Burn 11/26/2019,6:17 PM Pt laying in bed, eating dinner, watching tv. No visible distress, will continue to monitor under yellow mews guidelines

## 2019-11-26 NOTE — TOC Benefit Eligibility Note (Signed)
Transition of Care [facility]) Benefit Eligibility Note    Patient Details  Name: ADOLF ROSELLE MRN: [id number] Date of Birth: 1941-06-13   Medication/Dose: ELIQUIS  2.5 MG BID  - $40.00    AND    ELIQUIS  5 MG BID - $40.00  Covered?: Yes  Tier: 2 Drug  Prescription Coverage Preferred Pharmacy: [facility] Person/Company/Phone Number:: TONY   @   [employer]   [id number] #  [phone]  Co-Pay: $40.00   FOR  EAC PRESCRIPTION  Prior Approval: No          Memory Argue Phone Number: 11/26/2019, 11:41 AM

## 2019-11-26 NOTE — Progress Notes (Signed)
ANTICOAGULATION CONSULT NOTE  Pharmacy Consult for Heparin>>Eliquis Indication: R/O pulmonary embolism  No Known Allergies  Patient Measurements: Weight: 203 lb 7.8 oz (92.3 kg)  Height: 70 inches Heparin Dosing Weight: actual body weight  Vital Signs: Temp: 98.2 F (36.8 C) (01/12 0602) Temp Source: Oral (01/12 0602) BP: 142/82 (01/12 0602) Pulse Rate: 65 (01/12 0602)  Labs: Recent Labs    11/24/19 1412 11/24/19 1412 11/24/19 1730 11/25/19 0454 11/25/19 1405 11/25/19 1537 11/25/19 1724 11/25/19 2143 11/26/19 0458  HGB 16.3  --   --  14.8  --   --   --   --  14.4  HCT 50.0  --   --  45.4  --   --   --   --  44.3  PLT 141*  --   --  123*  --   --   --   --  118*  APTT  --   --  31 171*  --   --   --   --   --   LABPROT  --   --  13.7 14.3  --   --   --   --   --   INR  --   --  1.1 1.1  --   --   --   --   --   HEPARINUNFRC  --    < >  --  1.06* 0.62  --   --  0.45 0.49  CREATININE 2.95*  --   --  2.72*  --   --   --   --  2.99*  CKTOTAL  --   --  1,144*  --   --   --   --   --   --   TROPONINIHS 2,126*   < > 1,642* 710*  --  422* 383*  --   --    < > = values in this interval not displayed.    CrCl cannot be calculated (Unknown ideal weight.).  Medications:  No oral anticoagulation PTA  Assessment:  79 yr male presents with back pain and AMS  Elevated D-dimer  PMH significant for DM, HTN, HLD, prevoius bladder cancer  Pharmacy consulted to dose IV heparin for presumed pulmonary embolism  Today, 11/26/2019:  Hgb stable; Plt slightly lower but remains > 100k  Trop trending down  heparin therapeutic at 0.49 on 900 units/hr,   No bleeding reported VQ 1/11: Multiple small to moderate matched ventilation-perfusion defects in both lungs, nondiagnostic (low or intermediate probability) for PE  LE doppler + for acute R DVT 1/11 No CTA 2nd AKI  To transition to Eliquis today  Goal of Therapy:  Heparin level 0.3-0.7 units/ml Monitor platelets by  anticoagulation protocol: Yes   Plan:  Dc heparin drip now DC daily CBC and heparin levels Eliquis 10 mg po BID x 7 days followed by 5 mg po BID Will provide 30 day free card and education before discharge  [name], Pharm.D [phone] 11/26/2019 12:46 PM

## 2019-11-26 NOTE — Discharge Instructions (Addendum)
Acute Kidney Injury, Adult ° °Acute kidney injury is a sudden worsening of kidney function. The kidneys are organs that have several jobs. They filter the blood to remove waste products and extra fluid. They also maintain a healthy balance of minerals and hormones in the body, which helps control blood pressure and keep bones strong. With this condition, your kidneys do not do their jobs as well as they should. °This condition ranges from mild to severe. Over time it may develop into long-lasting (chronic) kidney disease. Early detection and treatment may prevent acute kidney injury from developing into a chronic condition. °What are the causes? °Common causes of this condition include: °· A problem with blood flow to the kidneys. This may be caused by: °? Low blood pressure (hypotension) or shock. °? Blood loss. °? Heart and blood vessel (cardiovascular) disease. °? Severe burns. °? Liver disease. °· Direct damage to the kidneys. This may be caused by: °? Certain medicines. °? A kidney infection. °? Poisoning. °? Being around or in contact with toxic substances. °? A surgical wound. °? A hard, direct hit to the kidney area. °· A sudden blockage of urine flow. This may be caused by: °? Cancer. °? Kidney stones. °? An enlarged prostate in males. °What are the signs or symptoms? °Symptoms of this condition may not be obvious until the condition becomes severe. Symptoms of this condition can include: °· Tiredness (lethargy), or difficulty staying awake. °· Nausea or vomiting. °· Swelling (edema) of the face, legs, ankles, or feet. °· Problems with urination, such as: °? Abdominal pain, or pain along the side of your stomach (flank). °? Decreased urine production. °? Decrease in the force of urine flow. °· Muscle twitches and cramps, especially in the legs. °· Confusion or trouble concentrating. °· Loss of appetite. °· Fever. °How is this diagnosed? °This condition may be diagnosed with tests, including: °· Blood  tests. °· Urine tests. °· Imaging tests. °· A test in which a sample of tissue is removed from the kidneys to be examined under a microscope (kidney biopsy). °How is this treated? °Treatment for this condition depends on the cause and how severe the condition is. In mild cases, treatment may not be needed. The kidneys may heal on their own. In more severe cases, treatment will involve: °· Treating the cause of the kidney injury. This may involve changing any medicines you are taking or adjusting your dosage. °· Fluids. You may need specialized IV fluids to balance your body's needs. °· Having a catheter placed to drain urine and prevent blockages. °· Preventing problems from occurring. This may mean avoiding certain medicines or procedures that can cause further injury to the kidneys. °In some cases treatment may also require: °· A procedure to remove toxic wastes from the body (dialysis or continuous renal replacement therapy - CRRT). °· Surgery. This may be done to repair a torn kidney, or to remove the blockage from the urinary system. °Follow these instructions at home: °Medicines °· Take over-the-counter and prescription medicines only as told by your health care provider. °· Do not take any new medicines without your health care provider's approval. Many medicines can worsen your kidney damage. °· Do not take any vitamin and mineral supplements without your health care provider's approval. Many nutritional supplements can worsen your kidney damage. °Lifestyle °· If your health care provider prescribed changes to your diet, follow them. You may need to decrease the amount of protein you eat. °· Achieve and maintain a healthy   weight. If you need help with this, ask your health care provider.  Start or continue an exercise plan. Try to exercise at least 30 minutes a day, 5 days a week.  Do not use any tobacco products, such as cigarettes, chewing tobacco, and e-cigarettes. If you need help quitting, ask your  health care provider. General instructions  Keep track of your blood pressure. Report changes in your blood pressure as told by your health care provider.  Stay up to date with immunizations. Ask your health care provider which immunizations you need.  Keep all follow-up visits as told by your health care provider. This is important. Where to find more information  American Association of Kidney Patients: BombTimer.gl  National Kidney Foundation: www.kidney.[place]: https://mathis.com/  [facility]: ? www.lifeoptions.org ? www.kidneyschool.org Contact a health care provider if:  Your symptoms get worse.  You develop new symptoms. Get help right away if:  You develop symptoms of worsening kidney disease, which include: ? Headaches. ? Abnormally dark or light skin. ? Easy bruising. ? Frequent hiccups. ? Chest pain. ? Shortness of breath. ? End of menstruation in women. ? Seizures. ? Confusion or altered mental status. ? Abdominal or back pain. ? Itchiness.  You have a fever.  Your body is producing less urine.  You have pain or bleeding when you urinate. Summary  Acute kidney injury is a sudden worsening of kidney function.  Acute kidney injury can be caused by problems with blood flow to the kidneys, direct damage to the kidneys, and sudden blockage of urine flow.  Symptoms of this condition may not be obvious until it becomes severe. Symptoms may include edema, lethargy, confusion, nausea or vomiting, and problems passing urine.  This condition can usually be diagnosed with blood tests, urine tests, and imaging tests. Sometimes a kidney biopsy is done to diagnose this condition.  Treatment for this condition often involves treating the underlying cause. It is treated with fluids, medicines, dialysis, diet changes, or surgery. This information is not intended to replace advice given to you by your health care provider. Make  sure you discuss any questions you have with your health care provider. Document Revised: 10/13/2017 Document Reviewed: 10/21/2016 Elsevier Patient Education  [place]. Deep Vein Thrombosis  Deep vein thrombosis (DVT) is a condition in which a blood clot forms in a deep vein, such as a lower leg, thigh, or arm vein. A clot is blood that has thickened into a gel or solid. This condition is dangerous. It can lead to serious and even life-threatening complications if the clot travels to the lungs and causes a blockage (pulmonary embolism). It can also damage veins in the leg. This can result in leg pain, swelling, discoloration, and sores (post-thrombotic syndrome). What are the causes? This condition may be caused by:  A slowdown of blood flow.  Damage to a vein.  A condition that causes blood to clot more easily, such as an inherited clotting disorder. What increases the risk? The following factors may make you more likely to develop this condition:  Being overweight.  Being older, especially over age 8.  Sitting or lying down for more than four hours.  Being in the hospital.  Lack of physical activity (sedentary lifestyle).  Pregnancy, being in childbirth, or having recently given birth.  Taking medicines that contain estrogen, such as medicines to prevent pregnancy.  Smoking.  A history of any of the following: ? Blood clots or a blood  clotting disease. ? Peripheral vascular disease. ? Inflammatory bowel disease. ? Cancer. ? Heart disease. ? Genetic conditions that affect how your blood clots, such as Factor V Leiden mutation. ? Neurological diseases that affect your legs (leg paresis). ? A recent injury, such as a car accident. ? Major or lengthy surgery. ? A central line placed inside a large vein. What are the signs or symptoms? Symptoms of this condition include:  Swelling, pain, or tenderness in an arm or leg.  Warmth, redness, or discoloration in an  arm or leg. If the clot is in your leg, symptoms may be more noticeable or worse when you stand or walk. Some people may not develop any symptoms. How is this diagnosed? This condition is diagnosed with:  A medical history and physical exam.  Tests, such as: ? Blood tests. These are done to check how well your blood clots. ? Ultrasound. This is done to check for clots. ? Venogram. For this test, contrast dye is injected into a vein and X-rays are taken to check for any clots. How is this treated? Treatment for this condition depends on:  The cause of your DVT.  Your risk for bleeding or developing more clots.  Any other medical conditions that you have. Treatment may include:  Taking a blood thinner (anticoagulant). This type of medicine prevents clots from forming. It may be taken by mouth, injected under the skin, or injected through an IV (catheter).  Injecting clot-dissolving medicines into the affected vein (catheter-directed thrombolysis).  Having surgery. Surgery may be done to: ? Remove the clot. ? Place a filter in a large vein to catch blood clots before they reach the lungs. Some treatments may be continued for up to six months. Follow these instructions at home: If you are taking blood thinners:  Take the medicine exactly as told by your health care provider. Some blood thinners need to be taken at the same time every day. Do not skip a dose.  Talk with your health care provider before you take any medicines that contain aspirin or NSAIDs. These medicines increase your risk for dangerous bleeding.  Ask your health care provider about foods and drugs that could change the way the medicine works (may interact). Avoid those things if your health care provider tells you to do so.  Blood thinners can cause easy bruising and may make it difficult to stop bleeding. Because of this: ? Be very careful when using knives, scissors, or other sharp objects. ? Use an electric  razor instead of a blade. ? Avoid activities that could cause injury or bruising, and follow instructions about how to prevent falls.  Wear a medical alert bracelet or carry a card that lists what medicines you take. General instructions  Take over-the-counter and prescription medicines only as told by your health care provider.  Return to your normal activities as told by your health care provider. Ask your health care provider what activities are safe for you.  Wear compression stockings if recommended by your health care provider.  Keep all follow-up visits as told by your health care provider. This is important. How is this prevented? To lower your risk of developing this condition again:  For 30 or more minutes every day, do an activity that: ? Involves moving your arms and legs. ? Increases your heart rate.  When traveling for longer than four hours: ? Exercise your arms and legs every hour. ? Drink plenty of water. ? Avoid drinking alcohol.  Avoid  sitting or lying for a long time without moving your legs.  If you have surgery or you are hospitalized, ask about ways to prevent blood clots. These may include taking frequent walks or using anticoagulants.  Stay at a healthy weight.  If you are a woman who is older than age 30, avoid unnecessary use of medicines that contain estrogen, such as some birth control pills.  Do not use any products that contain nicotine or tobacco, such as cigarettes and e-cigarettes. This is especially important if you take estrogen medicines. If you need help quitting, ask your health care provider. Contact a health care provider if:  You miss a dose of your blood thinner.  Your menstrual period is heavier than usual.  You have unusual bruising. Get help right away if:  You have: ? New or increased pain, swelling, or redness in an arm or leg. ? Numbness or tingling in an arm or leg. ? Shortness of breath. ? Chest pain. ? A rapid or  irregular heartbeat. ? A severe headache or confusion. ? A cut that will not stop bleeding.  There is blood in your vomit, stool, or urine.  You have a serious fall or accident, or you hit your head.  You feel light-headed or dizzy.  You cough up blood. These symptoms may represent a serious problem that is an emergency. Do not wait to see if the symptoms will go away. Get medical help right away. Call your local emergency services (911 in the [place] Do not drive yourself to the hospital. Summary  Deep vein thrombosis (DVT) is a condition in which a blood clot forms in a deep vein, such as a lower leg, thigh, or arm vein.  Symptoms can include swelling, warmth, pain, and redness in your leg or arm.  This condition may be treated with a blood thinner (anticoagulant medicine), medicine that is injected to dissolve blood clots,compression stockings, or surgery.  If you are prescribed blood thinners, take them exactly as told. This information is not intended to replace advice given to you by your health care provider. Make sure you discuss any questions you have with your health care provider. Document Revised: 10/13/2017 Document Reviewed: 03/31/2017 Elsevier Patient Education  [place] on my medicine - ELIQUIS (apixaban)  This medication education was reviewed with me or my healthcare representative as part of my discharge preparation.  The pharmacist that spoke with me during my hospital stay was:  [name], [facility]  Why was Eliquis prescribed for you? Eliquis was prescribed to treat blood clots that may have been found in the veins of your legs (deep vein thrombosis) or in your lungs (pulmonary embolism) and to reduce the risk of them occurring again.  What do You need to know about Eliquis ? The starting dose is 10 mg (two 5 mg tablets) taken TWICE daily for the FIRST SEVEN (7) DAYS, then on Tuesday 12/03/19  the dose is reduced to ONE 5 mg tablet  taken TWICE daily.  Eliquis may be taken with or without food.   Try to take the dose about the same time in the morning and in the evening. If you have difficulty swallowing the tablet whole please discuss with your pharmacist how to take the medication safely.  Take Eliquis exactly as prescribed and DO NOT stop taking Eliquis without talking to the doctor who prescribed the medication.  Stopping may increase your risk of developing a new blood clot.  Refill your prescription  before you run out.  After discharge, you should have regular check-up appointments with your healthcare provider that is prescribing your Eliquis.    What do you do if you miss a dose? If a dose of ELIQUIS is not taken at the scheduled time, take it as soon as possible on the same day and twice-daily administration should be resumed. The dose should not be doubled to make up for a missed dose.  Important Safety Information A possible side effect of Eliquis is bleeding. You should call your healthcare provider right away if you experience any of the following: ? Bleeding from an injury or your nose that does not stop. ? Unusual colored urine (red or dark brown) or unusual colored stools (red or black). ? Unusual bruising for unknown reasons. ? A serious fall or if you hit your head (even if there is no bleeding).  Some medicines may interact with Eliquis and might increase your risk of bleeding or clotting while on Eliquis. To help avoid this, consult your healthcare provider or pharmacist prior to using any new prescription or non-prescription medications, including herbals, vitamins, non-steroidal anti-inflammatory drugs (NSAIDs) and supplements.  This website has more information on Eliquis (apixaban): http://www.eliquis.com/eliquis/home

## 2019-11-26 NOTE — Progress Notes (Signed)
   Vital Signs MEWS/VS Documentation      11/26/2019 0700 11/26/2019 0826 11/26/2019 1424 11/26/2019 1436   MEWS Score:  0  0  2  2   MEWS Score Color:  Green  Green  Yellow  Yellow   Resp:  --  --  20  --   Pulse:  --  --  (!) 101  --   BP:  --  --  139/70  --   Temp:  --  --  (!) 101 F (38.3 C)  --   O2 Device:  --  --  Room Air  --   Level of Consciousness:  --  Alert  --  Alert           Imogene Burn 11/26/2019,2:50 PM Pt is resting in bed, watching TV. Pt states he feels fine. Provide notified, no new orders, will continue to monitor on [place] Guidelines

## 2019-11-26 NOTE — Progress Notes (Signed)
Physical Therapy Treatment Patient Details Name: Tony Fox MRN: [id number] DOB: 1941-04-02 Today's Date: 11/26/2019    History of Present Illness 79 yo male admitted with weakness. (+) dvt R LE.  Hx of dementia.    PT Comments    Co-tx with OT. Pt was cooperative and participatory on today. Mobility is significantly limited by pain in R LE 2* DVT. Wife was present in room to observe session. Discussed d/c plan-family is unable to manage pt's care at current level. Recommend [facility] SNF.     Follow Up Recommendations  SNF     Equipment Recommendations  (TBD at next venue)    Recommendations for Other Services       Precautions / Restrictions Precautions Precautions: Fall Restrictions Weight Bearing Restrictions: No    Mobility  Bed Mobility Overal bed mobility: Needs Assistance Bed Mobility: Rolling;Supine to Sit;Sit to Supine Rolling: Total assist;+2 for physical assistance   Supine to sit: Max assist;+2 for physical assistance Sit to supine: Total assist;+2 for physical assistance   General bed mobility comments: Assist for trunk and bil LEs. Utilized bedpad for scooting, positioning. Very painful R LE. Sat EOB for at least 10-15 mintues with Supv assist.  Transfers                 General transfer comment: NT-R LE too painful  Ambulation/Gait                 Stairs             Wheelchair Mobility    Modified Rankin (Stroke Patients Only)       Balance Overall balance assessment: Needs assistance Sitting-balance support: Bilateral upper extremity supported;Feet supported Sitting balance-Leahy Scale: Good                                      Cognition Arousal/Alertness: Awake/alert Behavior During Therapy: WFL for tasks assessed/performed Overall Cognitive Status: History of cognitive impairments - at baseline                                        Exercises General Exercises - Lower  Extremity Long Arc Quad: AROM;10 reps;Seated;Left    General Comments        Pertinent Vitals/Pain Pain Assessment: Faces Faces Pain Scale: Hurts worst Pain Location: R leg, especially knee Pain Descriptors / Indicators: Sharp;Tender;Grimacing;Guarding Pain Intervention(s): Limited activity within patient's tolerance;Monitored during session;Repositioned    Home Living Family/patient expects to be discharged to:: Unsure                    Prior Function        Comments: wife has been helping pt with adls for quite some time. He could get up to the bathroom and go to the kitchen to get what he wanted. Lately she and sons have been helping him try to get up   PT Goals (current goals can now be found in the care plan section) Progress towards PT goals: Progressing toward goals    Frequency    Min 2X/week      PT Plan Current plan remains appropriate    Co-evaluation   Reason for Co-Treatment: For patient/therapist safety PT goals addressed during session: Mobility/safety with mobility OT goals addressed during session: ADL's and self-care;Strengthening/ROM  AM-PAC PT "6 Clicks" Mobility   Outcome Measure  Help needed turning from your back to your side while in a flat bed without using bedrails?: Total Help needed moving from lying on your back to sitting on the side of a flat bed without using bedrails?: Total Help needed moving to and from a bed to a chair (including a wheelchair)?: Total Help needed standing up from a chair using your arms (e.g., wheelchair or bedside chair)?: Total Help needed to walk in hospital room?: Total Help needed climbing 3-5 steps with a railing? : Total 6 Click Score: 6    End of Session   Activity Tolerance: No increased pain Patient left: in bed;with call bell/phone within reach;with bed alarm set   PT Visit Diagnosis: Muscle weakness (generalized) (M62.81);Difficulty in walking, not elsewhere classified (R26.2)      Time: [id number] PT Time Calculation (min) (ACUTE ONLY): 37 min  Charges:  $Therapeutic Activity: 8-22 mins                        Doreatha Massed, PT Acute Rehabilitation

## 2019-11-26 NOTE — Progress Notes (Signed)
Brief Pharmacy Consult Note - IV heparin for DVT  Labs: heparin level 0.45  A/P: heparin level therapeutic (goal 0.3-0.7) now x last 2 levels on current IV heparin rate of 900 units/hr. Continue current IV heparin rate. Daily heparin level and CBC. No reported bleeding  Adrian Saran, PharmD, BCPS 11/26/2019 12:19 AM

## 2019-11-27 LAB — CBC
HCT: 42.2 % (ref 39.0–52.0)
Hemoglobin: 14.1 g/dL (ref 13.0–17.0)
MCH: 30.2 pg (ref 26.0–34.0)
MCHC: 33.4 g/dL (ref 30.0–36.0)
MCV: 90.4 fL (ref 80.0–100.0)
Platelets: 121 10*3/uL — ABNORMAL LOW (ref 150–400)
RBC: 4.67 MIL/uL (ref 4.22–5.81)
RDW: 12.8 % (ref 11.5–15.5)
WBC: 7.1 10*3/uL (ref 4.0–10.5)
nRBC: 0 % (ref 0.0–0.2)

## 2019-11-27 LAB — GLUCOSE, CAPILLARY
Glucose-Capillary: 139 mg/dL — ABNORMAL HIGH (ref 70–99)
Glucose-Capillary: 186 mg/dL — ABNORMAL HIGH (ref 70–99)
Glucose-Capillary: 104 mg/dL — ABNORMAL HIGH (ref 70–99)
Glucose-Capillary: 151 mg/dL — ABNORMAL HIGH (ref 70–99)

## 2019-11-27 LAB — BASIC METABOLIC PANEL
Anion gap: 9 (ref 5–15)
BUN: 42 mg/dL — ABNORMAL HIGH (ref 8–23)
CO2: 20 mmol/L — ABNORMAL LOW (ref 22–32)
Calcium: 8.8 mg/dL — ABNORMAL LOW (ref 8.9–10.3)
Chloride: 108 mmol/L (ref 98–111)
Creatinine, Ser: 2.35 mg/dL — ABNORMAL HIGH (ref 0.61–1.24)
GFR calc Af Amer: 29 mL/min — ABNORMAL LOW (ref >60)
GFR calc non Af Amer: 25 mL/min — ABNORMAL LOW (ref >60)
Glucose, Bld: 152 mg/dL — ABNORMAL HIGH (ref 70–99)
Potassium: 4.2 mmol/L (ref 3.5–5.1)
Sodium: 137 mmol/L (ref 135–145)

## 2019-11-27 LAB — SARS CORONAVIRUS 2 (TAT 6-24 HRS): SARS Coronavirus 2: NEGATIVE

## 2019-11-27 NOTE — NC FL2 (Signed)
[place] LEVEL OF CARE SCREENING TOOL     IDENTIFICATION  Patient Name: Tony Fox Birthdate: 01-May-1941 Sex: male Admission Date (Current Location): 11/24/2019  [facility] and [place] Number:  Herbalist and Address:  [facility],  [place] [address], [place]      Provider Number: [id number]  Attending Physician Name and Address:  Barb Merino, MD  Relative Name and Phone Number:  Dakotta Schoeff, [phone]    Current Level of Care: Hospital Recommended Level of Care: [place] Prior Approval Number:    Date Approved/Denied:   PASRR Number: [id number]  Discharge Plan: SNF    Current Diagnoses: Patient Active Problem List   Diagnosis Date Noted  . Elevated troponin level not due to acute coronary syndrome 11/24/2019  . AKI (acute kidney injury) ([place]) 11/24/2019  . Elevated d-dimer 11/24/2019  . Lactic acidosis 11/24/2019  . Generalized weakness 11/24/2019  . Thrombocytopenia ([place]) 11/24/2019  . Memory loss 07/16/2015  . History of bladder cancer 07/16/2015  . Essential hypertension 07/16/2015  . Hyperlipidemia 07/16/2015  . Uncomplicated type 2 diabetes mellitus ([place]) 07/16/2015    Orientation RESPIRATION BLADDER Height & Weight     Self, Situation  Normal External catheter Weight: 92.1 kg Height:     BEHAVIORAL SYMPTOMS/MOOD NEUROLOGICAL BOWEL NUTRITION STATUS  (none) (none) Incontinent Diet(HH, Carb Mod)  AMBULATORY STATUS COMMUNICATION OF NEEDS Skin   Extensive Assist Verbally Normal                       Personal Care Assistance Level of Assistance  Bathing, Feeding, Dressing Bathing Assistance: Maximum assistance Feeding assistance: Independent Dressing Assistance: Limited assistance     Functional Limitations Info  Sight, Hearing, Speech Sight Info: Adequate Hearing Info: Adequate Speech Info: Adequate    SPECIAL CARE FACTORS FREQUENCY  PT (By licensed PT)     PT  Frequency: 5X/W              Contractures Contractures Info: Not present    Additional Factors Info  Code Status, Allergies Code Status Info: DNR Allergies Info: NKA           Current Medications (11/27/2019):  This is the current hospital active medication list Current Facility-Administered Medications  Medication Dose Route Frequency Provider Last Rate Last Admin  . acetaminophen (TYLENOL) tablet 650 mg  650 mg Oral Q6H PRN Nicole Kindred A, DO   650 mg at 11/26/19 1436   Or  . acetaminophen (TYLENOL) suppository 650 mg  650 mg Rectal Q6H PRN Ezekiel Slocumb, DO      . apixaban (ELIQUIS) tablet 10 mg  10 mg Oral BID Leodis Sias T, RPH   10 mg at 11/27/19 0830   Followed by  . [START ON 12/03/2019] apixaban (ELIQUIS) tablet 5 mg  5 mg Oral BID [name], RPH      . ascorbic acid (VITAMIN C) tablet 500 mg  500 mg Oral Daily Nicole Kindred A, DO   500 mg at 11/27/19 0830  . aspirin EC tablet 81 mg  81 mg Oral Daily Nicole Kindred A, DO   81 mg at 11/27/19 0829  . atorvastatin (LIPITOR) tablet 20 mg  20 mg Oral Daily Nicole Kindred A, DO   20 mg at 11/27/19 0829  . bisacodyl (DULCOLAX) EC tablet 5 mg  5 mg Oral Daily PRN Nicole Kindred A, DO      . donepezil (ARICEPT) tablet 5 mg  5 mg Oral BID Nicole Kindred A, DO   5 mg at 11/27/19 0830  . haloperidol lactate (HALDOL) injection 1 mg  1 mg Intravenous Q6H PRN [place] Donnamarie Poag, DO      . hydrALAZINE (APRESOLINE) tablet 25 mg  25 mg Oral Q6H PRN Nicole Kindred A, DO      . insulin aspart (novoLOG) injection 0-9 Units  0-9 Units Subcutaneous TID WC Nicole Kindred A, DO   2 Units at 11/27/19 1210  . magnesium citrate solution 1 Bottle  1 Bottle Oral Once PRN Nicole Kindred A, DO      . Melatonin TABS 6 mg  6 mg Oral QHS [place] Donnamarie Poag, DO   6 mg at 11/26/19 2100  . ondansetron (ZOFRAN) tablet 4 mg  4 mg Oral Q6H PRN Nicole Kindred A, DO       Or  . ondansetron (ZOFRAN) injection 4 mg  4 mg Intravenous Q6H PRN  Nicole Kindred A, DO      . polyethylene glycol (MIRALAX / GLYCOLAX) packet 17 g  17 g Oral Daily PRN Ezekiel Slocumb, DO         Discharge Medications: Please see discharge summary for a list of discharge medications.  Relevant Imaging Results:  Relevant Lab Results:   Additional Information [place], [place]

## 2019-11-27 NOTE — Progress Notes (Signed)
PROGRESS NOTE    ZEREK MCCAMISH  [id number] DOB: 01-11-1941 DOA: 11/24/2019 PCP: Willey Blade, MD    Brief Narrative:  DEMETRI DALPIAZ is a 79 y.o.malewith medical history significant ofhypertension, hyperlipidemia, dementia, type 2 diabetes, vitamin D deficiency who presented to [facility] ED from home with his wife due to progressive generalized weakness over 3 to 4 days. At baseline patient ambulates with a cane, wife recently got him a walker. Per report, patient had been seen by PCP recently for increased leg swelling. His HCTZ was changed to Lasix. Wife states patient has no known history of blood clots,but is rather sedentary.  In the emergency room, temperature 99.5.  On room air.Labs notable for creatinine 2.95, BNP normal 21.7, highly sensitive troponin elevated but trended downward (2126-->1642), total CK elevated 1144, lactic acid elevated 2.5.D-dimer elevated 17.2. UA negative for infection.Covid PCR negative. Influenza A/B negative.CBC normal with the exception of mild thrombocytopenia with platelets 141. Noncontrast head CT showed only advanced chronic ischemic changes. Chest x-ray was normal. ECG was normal sinus rhythm at 77 bpm with nonspecific T wave changes. CTA chest was not performed to evaluate for pulmonary embolism due to acute kidney injury. Patient was started empirically on heparin gtt for presumed PE more likely than ACS and IV fluids.  Assessment & Plan:   Principal Problem:   Generalized weakness Active Problems:   Memory loss   Essential hypertension   Hyperlipidemia   Uncomplicated type 2 diabetes mellitus (HCC)   Elevated troponin level not due to acute coronary syndrome   AKI (acute kidney injury) ([place])   Elevated d-dimer   Lactic acidosis   Thrombocytopenia (HCC)  Acute right lower extremity DVT: With elevated D-dimer.  Unable to have CTA.  VQ scan low probability. Patient was treated with heparin and currently transition  to Eliquis.  Tolerating well.  Hemoglobin is stable. Will treat with 6 months of anticoagulation.  Elevated troponin due to thromboembolism: Echocardiogram normal.  No evidence of ACS.  On aspirin.  Discontinue aspirin when on Eliquis to avoid bleeding risk.  Physical deconditioning/debility with history of dementia: Patient needs inpatient PT OT.  Refer to skilled nursing facility.  Acute kidney injury on chronic kidney disease stage IIIa: With longstanding history of chronic kidney disease.  Creatinine 1.4 in 2017.  Treated with gentle IV fluids.  Renal ultrasound with normal echogenicity.  No hydronephrosis. Treated with gentle IV fluid with appropriate response 2.95-2.72-2.99-2.35.  This is probably his new baseline.  Discontinue all IV fluids.  Monitor levels.  Dementia: On donepezil and melatonin at night.  Fall and delirium precautions.  Type 2 diabetes: On glipizide Metformin at home.  Currently on sliding scale insulin.  Will go back on oral hypoglycemics on discharge.   DVT prophylaxis: Eliquis Code Status: DNR Family Communication: Patient's wife updated on the phone Disposition Plan: Skilled nursing facility when bed available.  COVID-19 testing in anticipation of discharge.   Consultants:   None  Procedures:   None  Antimicrobials:   None   Subjective: Patient seen and examined.  No overnight events.  Did not have any confusions overnight.  Apparently he was reporting severe right leg pain yesterday, today he denies any.  Denies any chest pain or shortness of breath.  He is not sure why he came to the hospital.  Objective: Vitals:   11/26/19 1810 11/26/19 2022 11/27/19 0259 11/27/19 0550  BP: 127/79 121/68 116/67 108/63  Pulse: 88 78 75 71  Resp: 20 20 19  19  Temp: 98.4 F (36.9 C) 98.5 F (36.9 C) 98 F (36.7 C) 98.6 F (37 C)  TempSrc: Oral Oral  Oral  SpO2: 94% 95% 92% 94%  Weight:    92.1 kg    Intake/Output Summary (Last 24 hours) at 11/27/2019  1325 Last data filed at 11/27/2019 1308 Gross per 24 hour  Intake 360 ml  Output 1600 ml  Net -1240 ml   Filed Weights   11/25/19 0553 11/26/19 0500 11/27/19 0550  Weight: 92 kg 92.3 kg 92.1 kg    Examination:  General exam: Appears calm and comfortable, on room air. Respiratory system: Clear to auscultation. Respiratory effort normal.  No added sounds Cardiovascular system: S1 & S2 heard, RRR. No JVD, murmurs, rubs, gallops or clicks. No pedal edema.  No localized tenderness. Gastrointestinal system: Abdomen is nondistended, soft and nontender. No organomegaly or masses felt. Normal bowel sounds heard. Central nervous system: Alert and oriented x1. No focal neurological deficits. Extremities: Symmetric 5 x 5 power. Skin: No rashes, lesions or ulcers Psychiatry: Judgement and insight appear normal. Mood & affect appropriate.     Data Reviewed: I have personally reviewed following labs and imaging studies  CBC: Recent Labs  Lab 11/24/19 1412 11/25/19 0454 11/26/19 0458 11/27/19 0613  WBC 7.6 5.8 6.6 7.1  NEUTROABS 5.2  --   --   --   HGB 16.3 14.8 14.4 14.1  HCT 50.0 45.4 44.3 42.2  MCV 89.4 90.8 90.0 90.4  PLT 141* 123* 118* 123XX123*   Basic Metabolic Panel: Recent Labs  Lab 11/24/19 1412 11/25/19 0454 11/26/19 0458 11/27/19 0613  NA 141 142 140 137  K 3.9 4.4 4.7 4.2  CL 104 107 107 108  CO2 22 23 22  20*  GLUCOSE 114* 132* 119* 152*  BUN 49* 46* 51* 42*  CREATININE 2.95* 2.72* 2.99* 2.35*  CALCIUM 9.6 9.0 9.1 8.8*   GFR: CrCl cannot be calculated (Unknown ideal weight.). Liver Function Tests: Recent Labs  Lab 11/24/19 1412  AST 46*  ALT 27  ALKPHOS 48  BILITOT 1.2  PROT 8.0  ALBUMIN 4.2   No results for input(s): LIPASE, AMYLASE in the last 168 hours. No results for input(s): AMMONIA in the last 168 hours. Coagulation Profile: Recent Labs  Lab 11/24/19 1730 11/25/19 0454  INR 1.1 1.1   Cardiac Enzymes: Recent Labs  Lab 11/24/19 1730    CKTOTAL 1,144*   BNP (last 3 results) No results for input(s): PROBNP in the last 8760 hours. HbA1C: Recent Labs    11/25/19 0454  HGBA1C 8.7*   CBG: Recent Labs  Lab 11/26/19 1144 11/26/19 1658 11/26/19 2025 11/27/19 0741 11/27/19 1147  GLUCAP 107* 137* 196* 139* 186*   Lipid Profile: No results for input(s): CHOL, HDL, LDLCALC, TRIG, CHOLHDL, LDLDIRECT in the last 72 hours. Thyroid Function Tests: No results for input(s): TSH, T4TOTAL, FREET4, T3FREE, THYROIDAB in the last 72 hours. Anemia Panel: No results for input(s): VITAMINB12, FOLATE, FERRITIN, TIBC, IRON, RETICCTPCT in the last 72 hours. Sepsis Labs: Recent Labs  Lab 11/24/19 1412 11/25/19 0454  LATICACIDVEN 2.5* 1.7    Recent Results (from the past 240 hour(s))  Urine culture     Status: Abnormal   Collection Time: 11/24/19  2:12 PM   Specimen: Urine, Random  Result Value Ref Range Status   Specimen Description   Final    URINE, RANDOM Performed at [facility] [address] [place], [place] [address]    Special Requests  Final    NONE Performed at [facility], [place] [address] [place], [place] [address]    Culture MULTIPLE SPECIES PRESENT, SUGGEST RECOLLECTION (A)  Final   Report Status 11/25/2019 FINAL  Final  Culture, blood (x 2)     Status: None (Preliminary result)   Collection Time: 11/24/19  2:12 PM   Specimen: BLOOD RIGHT HAND  Result Value Ref Range Status   Specimen Description   Final    BLOOD RIGHT HAND Performed at [facility] [address], [place], [place] [address]    Special Requests   Final    BOTTLES DRAWN AEROBIC AND ANAEROBIC Blood Culture results may not be optimal due to an inadequate volume of blood received in culture bottles Performed at [facility] [address] [place], [place] [address]    Culture   Final    NO GROWTH 3 DAYS Performed at [facility], [place] [address],  [place], [place] [address]    Report Status PENDING  Incomplete  Respiratory Panel by RT PCR (Flu A&B, Covid) - Urine, Clean Catch     Status: None   Collection Time: 11/24/19  2:12 PM   Specimen: Urine, Clean Catch  Result Value Ref Range Status   SARS Coronavirus 2 by RT PCR NEGATIVE NEGATIVE Final    Comment: (NOTE) SARS-CoV-2 target nucleic acids are NOT DETECTED. The SARS-CoV-2 RNA is generally detectable in upper respiratoy specimens during the acute phase of infection. The lowest concentration of SARS-CoV-2 viral copies this assay can detect is 131 copies/mL. A negative result does not preclude SARS-Cov-2 infection and should not be used as the sole basis for treatment or other patient management decisions. A negative result may occur with  improper specimen collection/handling, submission of specimen other than nasopharyngeal swab, presence of viral mutation(s) within the areas targeted by this assay, and inadequate number of viral copies (<131 copies/mL). A negative result must be combined with clinical observations, patient history, and epidemiological information. The expected result is Negative. Fact Sheet for Patients:  PinkCheek.be Fact Sheet for Healthcare Providers:  GravelBags.it This test is not yet ap proved or cleared by the [place] FDA and  has been authorized for detection and/or diagnosis of SARS-CoV-2 by FDA under an Emergency Use Authorization (EUA). This EUA will remain  in effect (meaning this test can be used) for the duration of the COVID-19 declaration under Section 564(b)(1) of the Act, 21 U.S.C. section 360bbb-3(b)(1), unless the authorization is terminated or revoked sooner.    Influenza A by PCR NEGATIVE NEGATIVE Final   Influenza B by PCR NEGATIVE NEGATIVE Final    Comment: (NOTE) The Xpert Xpress SARS-CoV-2/FLU/RSV assay is intended as an aid in  the diagnosis of influenza from  Nasopharyngeal swab specimens and  should not be used as a sole basis for treatment. Nasal washings and  aspirates are unacceptable for Xpert Xpress SARS-CoV-2/FLU/RSV  testing. Fact Sheet for Patients: PinkCheek.be Fact Sheet for Healthcare Providers: GravelBags.it This test is not yet approved or cleared by the [place] FDA and  has been authorized for detection and/or diagnosis of SARS-CoV-2 by  FDA under an Emergency Use Authorization (EUA). This EUA will remain  in effect (meaning this test can be used) for the duration of the  Covid-19 declaration under Section 564(b)(1) of the Act, 21  U.S.C. section 360bbb-3(b)(1), unless the authorization is  terminated or revoked. Performed at [facility], [address] [place],  [place] [address]   Culture, blood (x 2)     Status: None (Preliminary result)   Collection Time: 11/24/19  2:33 PM   Specimen: BLOOD  Result Value Ref Range Status   Specimen Description   Final    BLOOD LEFT ANTECUBITAL Performed at [facility] [address], [place], [place] [address]    Special Requests   Final    BOTTLES DRAWN AEROBIC AND ANAEROBIC Blood Culture results may not be optimal due to an inadequate volume of blood received in culture bottles Performed at [facility] [address] [place], [place] [address]    Culture   Final    NO GROWTH 3 DAYS Performed at [facility], [place] [address] [place], [place] [address]    Report Status PENDING  Incomplete         Radiology Studies: US RENAL  Result Date: 11/26/2019 CLINICAL DATA:  Acute renal failure.  History of bladder tumor. EXAM: RENAL / URINARY TRACT ULTRASOUND COMPLETE COMPARISON:  CT abdomen pelvis 02/12/2018 FINDINGS: Right Kidney: Renal measurements: 9.9 x 4.8 x 5.0 cm. = volume: 123 mL. Right upper pole renal calculus 6 mm. Left Kidney: Renal  measurements: 9.6 x 4.5 x 4.5 cm = volume: 101 mL. Left midpole cyst 1 cm. Left lower pole renal calculus. Bladder: Appears normal for degree of bladder distention. Other: None. IMPRESSION: Small sized kidneys with normal echogenicity of the renal cortex. No obstruction or mass. Bilateral renal calculi Electronically Signed   By: Franchot Gallo M.D.   On: 11/26/2019 13:34   NM Pulmonary Perf and Vent  Result Date: 11/25/2019 CLINICAL DATA:  Back pain for several weeks. Elevated D-dimer levels. High clinical suspicion of pulmonary embolism. EXAM: NUCLEAR MEDICINE VENTILATION - PERFUSION LUNG SCAN TECHNIQUE: Ventilation images were obtained in multiple projections using inhaled aerosol Tc-60m DTPA. Perfusion images were obtained in multiple projections after intravenous injection of Tc-23m MAA. RADIOPHARMACEUTICALS:  42.0 mCi of Tc-74m DTPA aerosol inhalation and 1.64 mCi Tc92m MAA IV COMPARISON:  Portable chest radiographs 11/25/2019 and 11/24/2019. FINDINGS: Ventilation: Multiple small to moderate ventilation defects in both lungs, right greater than left. Perfusion: There are multiple small to moderate perfusion defects in both lungs, nearly precisely matched to the ventilatory defects. No ventilation/perfusion mismatch is identified. The lungs are clear on today's radiographs. IMPRESSION: Multiple small to moderate matched ventilation-perfusion defects in both lungs, nondiagnostic (low or intermediate probability) for pulmonary embolism. Recommend further evaluation with chest CTA and/or lower extremity Doppler ultrasound. Electronically Signed   By: Richardean Sale M.D.   On: 11/25/2019 13:55   ECHOCARDIOGRAM COMPLETE  Result Date: 11/25/2019   ECHOCARDIOGRAM REPORT   Patient Name:   KINGSTIN ASARE Date of Exam: 11/25/2019 Medical Rec #:  [id number]        Height:       70.0 in Accession #:    [id number]       Weight:       202.8 lb Date of Birth:  [birth year]        BSA:          2.10 m Patient Age:    81  years         BP:           101/59 mmHg Patient Gender: M                HR:           73 bpm. Exam Location:  Inpatient Procedure: 2D Echo, Cardiac Doppler and Color Doppler Indications:    Elevated troponin.  History:        Patient has no prior history of Echocardiogram examinations.                 Risk Factors:Hypertension, Diabetes, Dyslipidemia and                 Non-Smoker.  Sonographer:    Paulita Fujita RDCS Referring Phys: [id number] [facility]  Sonographer Comments: Image acquisition challenging due to patient body habitus. IMPRESSIONS  1. Left ventricular ejection fraction, by visual estimation, is 55 to 60%. The left ventricle has normal function. There is no left ventricular hypertrophy.  2. Left ventricular diastolic parameters are consistent with Grade I diastolic dysfunction (impaired relaxation).  3. The left ventricle has no regional wall motion abnormalities.  4. Global right ventricle has normal systolic function.The right ventricular size is normal. No increase in right ventricular wall thickness.  5. Left atrial size was normal.  6. Right atrial size was normal.  7. The mitral valve is normal in structure. No evidence of mitral valve regurgitation. No evidence of mitral stenosis.  8. The tricuspid valve is normal in structure.  9. The aortic valve is normal in structure. Aortic valve regurgitation is not visualized. Mild to moderate aortic valve sclerosis/calcification without any evidence of aortic stenosis. 10. Pulmonic regurgitation is mild. 11. The pulmonic valve was normal in structure. Pulmonic valve regurgitation is mild. 12. Moderately elevated pulmonary artery systolic pressure. 13. The tricuspid regurgitant velocity is 3.21 m/s, and with an assumed right atrial pressure of 8 mmHg, the estimated right ventricular systolic pressure is moderately elevated at 49.2 mmHg. 14. The inferior vena cava is normal in size with greater than 50% respiratory variability, suggesting right  atrial pressure of 3 mmHg. FINDINGS  Left Ventricle: Left ventricular ejection fraction, by visual estimation, is 55 to 60%. The left ventricle has normal function. The left ventricle has no regional wall motion abnormalities. There is no left ventricular hypertrophy. Left ventricular diastolic parameters are consistent with Grade I diastolic dysfunction (impaired relaxation). Normal left atrial pressure. Right Ventricle: The right ventricular size is normal. No increase in right ventricular wall thickness. Global RV systolic function is has normal systolic function. The tricuspid regurgitant velocity is 3.21 m/s, and with an assumed right atrial pressure  of 8 mmHg, the estimated right ventricular systolic pressure is moderately elevated at 49.2 mmHg. Left Atrium: Left atrial size was normal in size. Right Atrium: Right atrial size was normal in size Pericardium: There is no evidence of pericardial effusion. Mitral Valve: The mitral valve is normal in structure. No evidence of mitral valve regurgitation. No evidence of mitral valve stenosis by observation. Tricuspid Valve: The tricuspid valve is normal in structure. Tricuspid valve regurgitation is mild. Aortic Valve: The aortic valve is normal in structure.. There is moderate thickening and moderate calcification of the aortic valve. Aortic valve regurgitation is not visualized. Mild to moderate aortic valve sclerosis/calcification is present, without any evidence of aortic stenosis. There is moderate thickening of the aortic valve. There is moderate calcification of the aortic valve. Pulmonic Valve: The pulmonic valve was normal in structure. Pulmonic valve regurgitation is mild. Pulmonic regurgitation is mild. Aorta: The aortic root, ascending aorta and aortic arch are all structurally normal, with no evidence of dilitation or obstruction. Venous: The inferior vena cava was not well visualized. The inferior vena cava is normal in size with greater than  50%  respiratory variability, suggesting right atrial pressure of 3 mmHg. IAS/Shunts: No atrial level shunt detected by color flow Doppler. There is no evidence of a patent foramen ovale. No ventricular septal defect is seen or detected. There is no evidence of an atrial septal defect.  LEFT VENTRICLE PLAX 2D LVIDd:         4.40 cm       Diastology LVIDs:         3.30 cm       LV e' lateral:   11.00 cm/s LV PW:         0.90 cm       LV E/e' lateral: 3.0 LV IVS:        0.90 cm       LV e' medial:    6.42 cm/s LVOT diam:     1.60 cm       LV E/e' medial:  5.1 LV SV:         44 ml LV SV Index:   20.23 LVOT Area:     2.01 cm  LV Volumes (MOD) LV area d, A2C:    23.50 cm LV area d, A4C:    28.70 cm LV area s, A2C:    14.40 cm LV area s, A4C:    19.50 cm LV major d, A2C:   6.74 cm LV major d, A4C:   7.37 cm LV major s, A2C:   5.01 cm LV major s, A4C:   6.34 cm LV vol d, MOD A2C: 71.4 ml LV vol d, MOD A4C: 98.3 ml LV vol s, MOD A2C: 36.1 ml LV vol s, MOD A4C: 52.9 ml LV SV MOD A2C:     35.3 ml LV SV MOD A4C:     98.3 ml LV SV MOD BP:      38.4 ml RIGHT VENTRICLE RV S prime:     14.30 cm/s TAPSE (M-mode): 1.6 cm LEFT ATRIUM           Index       RIGHT ATRIUM          Index LA diam:      3.50 cm 1.67 cm/m  RA Area:     8.21 cm LA Vol (A2C): 18.1 ml 8.62 ml/m  RA Volume:   12.80 ml 6.10 ml/m LA Vol (A4C): 23.0 ml 10.95 ml/m  AORTIC VALVE LVOT Vmax:   59.90 cm/s LVOT Vmean:  45.000 cm/s LVOT VTI:    0.121 m  AORTA Ao Root diam: 3.20 cm MITRAL VALVE                        TRICUSPID VALVE MV Area (PHT): 2.74 cm             TR Peak grad:   41.2 mmHg MV PHT:        80.33 msec           TR Vmax:        321.00 cm/s MV Decel Time: 277 msec MV E velocity: 32.60 cm/s 103 cm/s  SHUNTS MV A velocity: 50.00 cm/s 70.3 cm/s Systemic VTI:  0.12 m MV E/A ratio:  0.65       1.5       Systemic Diam: 1.60 cm  Candee Furbish MD Electronically signed by Candee Furbish MD Signature Date/Time: 11/25/2019/3:37:48 PM    Final    VAS [place] LOWER  EXTREMITY VENOUS (DVT)  Result Date: 11/25/2019  Lower Venous  Study Indications: Elevated Ddimer.  Risk Factors: None identified. Limitations: Body habitus, poor ultrasound/tissue interface and patient immobility, patient positioning. Comparison Study: No prior studies. Performing Technologist: Oliver Hum RVT  Examination Guidelines: A complete evaluation includes B-mode imaging, spectral Doppler, color Doppler, and power Doppler as needed of all accessible portions of each vessel. Bilateral testing is considered an integral part of a complete examination. Limited examinations for reoccurring indications may be performed as noted.  +---------+---------------+---------+-----------+----------+--------------+ RIGHT    CompressibilityPhasicitySpontaneityPropertiesThrombus Aging +---------+---------------+---------+-----------+----------+--------------+ CFV      Full           Yes      Yes                                 +---------+---------------+---------+-----------+----------+--------------+ SFJ      Full                                                        +---------+---------------+---------+-----------+----------+--------------+ FV Prox  None                                         Acute          +---------+---------------+---------+-----------+----------+--------------+ FV Mid   None                                         Acute          +---------+---------------+---------+-----------+----------+--------------+ FV DistalPartial        Yes      Yes                  Acute          +---------+---------------+---------+-----------+----------+--------------+ PFV      Full                                                        +---------+---------------+---------+-----------+----------+--------------+ POP      Partial        Yes      Yes                  Acute          +---------+---------------+---------+-----------+----------+--------------+ PTV       None                                         Acute          +---------+---------------+---------+-----------+----------+--------------+ PERO     None                                                        +---------+---------------+---------+-----------+----------+--------------+   +---------+---------------+---------+-----------+----------+--------------+ LEFT  CompressibilityPhasicitySpontaneityPropertiesThrombus Aging +---------+---------------+---------+-----------+----------+--------------+ CFV      Full           Yes      Yes                                 +---------+---------------+---------+-----------+----------+--------------+ SFJ      Full                                                        +---------+---------------+---------+-----------+----------+--------------+ FV Prox  Full                                                        +---------+---------------+---------+-----------+----------+--------------+ FV Mid   Full                                                        +---------+---------------+---------+-----------+----------+--------------+ FV DistalFull                                                        +---------+---------------+---------+-----------+----------+--------------+ PFV      Full                                                        +---------+---------------+---------+-----------+----------+--------------+ POP      Full           Yes      Yes                                 +---------+---------------+---------+-----------+----------+--------------+ PTV                                                   Not visualized +---------+---------------+---------+-----------+----------+--------------+ PERO                                                  Not visualized +---------+---------------+---------+-----------+----------+--------------+     Summary: Right: Findings consistent with acute deep  vein thrombosis involving the right femoral vein, right popliteal vein, and right posterior tibial veins. No cystic structure found in the popliteal fossa. Left: There is no evidence of deep vein thrombosis in the lower extremity. However, portions of this examination were limited- see technologist comments above. No cystic structure found in the popliteal fossa.  *See table(s) above  for measurements and observations. Electronically signed by Curt Jews MD on 11/25/2019 at 5:13:39 PM.    Final         Scheduled Meds: . apixaban  10 mg Oral BID   Followed by  . [START ON 12/03/2019] apixaban  5 mg Oral BID  . vitamin C  500 mg Oral Daily  . aspirin EC  81 mg Oral Daily  . atorvastatin  20 mg Oral Daily  . donepezil  5 mg Oral BID  . insulin aspart  0-9 Units Subcutaneous TID WC  . Melatonin  6 mg Oral QHS   Continuous Infusions:   LOS: 3 days    Time spent: 30 minutes    Barb Merino, MD [facility] Pager [phone]

## 2019-11-27 NOTE — Care Management Important Message (Signed)
Important Message  Patient Details IM Letter given to [name] SW Case Manager to present to the [facility] Name: Tony Fox MRN: [id number] Date of Birth: [birth year]   Medicare Important Message Given:  Yes     Kerin Salen 11/27/2019, 12:09 PM

## 2019-11-28 LAB — GLUCOSE, CAPILLARY
Glucose-Capillary: 119 mg/dL — ABNORMAL HIGH (ref 70–99)
Glucose-Capillary: 179 mg/dL — ABNORMAL HIGH (ref 70–99)
Glucose-Capillary: 116 mg/dL — ABNORMAL HIGH (ref 70–99)
Glucose-Capillary: 203 mg/dL — ABNORMAL HIGH (ref 70–99)

## 2019-11-28 NOTE — Progress Notes (Signed)
PROGRESS NOTE    [name]  [id number] DOB: 1941-01-04 DOA: 11/24/2019 PCP: Willey Blade, MD    Brief Narrative:  Tony Fox is a 79 y.o.malewith medical history significant ofhypertension, hyperlipidemia, dementia, type 2 diabetes, vitamin D deficiency who presented to [facility] ED from home with his wife due to progressive generalized weakness over 3 to 4 days. At baseline patient ambulates with a cane, wife recently got him a walker. Per report, patient had been seen by PCP recently for increased leg swelling. His HCTZ was changed to Lasix. Wife states patient has no known history of blood clots,but is rather sedentary.  In the emergency room, temperature 99.5.  On room air.Labs notable for creatinine 2.95, BNP normal 21.7, highly sensitive troponin elevated but trended downward (2126-->1642), total CK elevated 1144, lactic acid elevated 2.5.D-dimer elevated 17.2. UA negative for infection.Covid PCR negative. Influenza A/B negative.CBC normal with the exception of mild thrombocytopenia with platelets 141. Noncontrast head CT showed only advanced chronic ischemic changes. Chest x-ray was normal. ECG was normal sinus rhythm at 77 bpm with nonspecific T wave changes. CTA chest was not performed to evaluate for pulmonary embolism due to acute kidney injury. Patient was started empirically on heparin gtt for presumed PE more likely than ACS and IV fluids.  Assessment & Plan:   Principal Problem:   Generalized weakness Active Problems:   Memory loss   Essential hypertension   Hyperlipidemia   Uncomplicated type 2 diabetes mellitus (HCC)   Elevated troponin level not due to acute coronary syndrome   AKI (acute kidney injury) ([place])   Elevated d-dimer   Lactic acidosis   Thrombocytopenia (HCC)  Acute right lower extremity DVT: With elevated D-dimer.  Unable to have CTA.  VQ scan low to moderate probability. Patient was treated with heparin and  currently transition to Eliquis.  Tolerating well.  Hemoglobin is stable. Will treat with 6 months of anticoagulation.  Elevated troponin due to thromboembolism: Echocardiogram normal.  No evidence of ACS.  On aspirin.  Discontinue aspirin when on Eliquis to avoid bleeding risk.  Physical deconditioning/debility with history of dementia: Patient needs inpatient PT OT.  Refer to skilled nursing facility.  Acute kidney injury on chronic kidney disease stage IIIa: With longstanding history of chronic kidney disease.  Creatinine 1.4 in 2017.  Treated with gentle IV fluids.  Renal ultrasound with normal echogenicity.  No hydronephrosis. Treated with gentle IV fluid with appropriate response 2.95-2.72-2.99-2.35.  This is probably his new baseline.  Discontinue all IV fluids.  Monitor levels. Will need close monitoring.  Recheck in 1 week.  Dementia: On donepezil and melatonin at night.  Fall and delirium precautions.  Type 2 diabetes: On glipizide Metformin at home.  Currently on sliding scale insulin.  Will go back on oral hypoglycemics on discharge.   DVT prophylaxis: Eliquis Code Status: DNR Family Communication: Patient's wife updated on the phone 1/13. Disposition Plan: Skilled nursing facility when bed available.  Medically stable.   Consultants:   None  Procedures:   None  Antimicrobials:   None   Subjective: Seen and examined.  No overnight events.  Denies any leg pain.  Mostly focused on his service in the [employer]. Objective: Vitals:   11/27/19 1403 11/27/19 2131 11/28/19 0500 11/28/19 0520  BP: 119/77 119/68  114/71  Pulse: 69 70  64  Resp: 18 20  18   Temp: 99.3 F (37.4 C) 98.5 F (36.9 C)  97.9 F (36.6 C)  TempSrc: Oral Oral  Oral  SpO2: 98% 94%  95%  Weight:   92.2 kg     Intake/Output Summary (Last 24 hours) at 11/28/2019 1103 Last data filed at 11/28/2019 0517 Gross per 24 hour  Intake 360 ml  Output 675 ml  Net -315 ml   Filed Weights   11/26/19  0500 11/27/19 0550 11/28/19 0500  Weight: 92.3 kg 92.1 kg 92.2 kg    Examination:  General exam: Appears calm and comfortable, on room air. Respiratory system: Clear to auscultation. Respiratory effort normal.  No added sounds Cardiovascular system: S1 & S2 heard, RRR. No JVD, murmurs, rubs, gallops or clicks. No pedal edema.  No localized tenderness. Gastrointestinal system: Abdomen is nondistended, soft and nontender. No organomegaly or masses felt. Normal bowel sounds heard. Central nervous system: Alert and oriented x 1-2. No focal neurological deficits. Extremities: Symmetric 5 x 5 power. Skin: No rashes, lesions or ulcers Psychiatry: Judgement and insight appear normal. Mood & affect appropriate.     Data Reviewed: I have personally reviewed following labs and imaging studies  CBC: Recent Labs  Lab 11/24/19 1412 11/25/19 0454 11/26/19 0458 11/27/19 0613  WBC 7.6 5.8 6.6 7.1  NEUTROABS 5.2  --   --   --   HGB 16.3 14.8 14.4 14.1  HCT 50.0 45.4 44.3 42.2  MCV 89.4 90.8 90.0 90.4  PLT 141* 123* 118* 123XX123*   Basic Metabolic Panel: Recent Labs  Lab 11/24/19 1412 11/25/19 0454 11/26/19 0458 11/27/19 0613  NA 141 142 140 137  K 3.9 4.4 4.7 4.2  CL 104 107 107 108  CO2 22 23 22  20*  GLUCOSE 114* 132* 119* 152*  BUN 49* 46* 51* 42*  CREATININE 2.95* 2.72* 2.99* 2.35*  CALCIUM 9.6 9.0 9.1 8.8*   GFR: CrCl cannot be calculated (Unknown ideal weight.). Liver Function Tests: Recent Labs  Lab 11/24/19 1412  AST 46*  ALT 27  ALKPHOS 48  BILITOT 1.2  PROT 8.0  ALBUMIN 4.2   No results for input(s): LIPASE, AMYLASE in the last 168 hours. No results for input(s): AMMONIA in the last 168 hours. Coagulation Profile: Recent Labs  Lab 11/24/19 1730 11/25/19 0454  INR 1.1 1.1   Cardiac Enzymes: Recent Labs  Lab 11/24/19 1730  CKTOTAL 1,144*   BNP (last 3 results) No results for input(s): PROBNP in the last 8760 hours. HbA1C: No results for input(s):  HGBA1C in the last 72 hours. CBG: Recent Labs  Lab 11/27/19 0741 11/27/19 1147 11/27/19 1658 11/27/19 2132 11/28/19 0750  GLUCAP 139* 186* 104* 151* 119*   Lipid Profile: No results for input(s): CHOL, HDL, LDLCALC, TRIG, CHOLHDL, LDLDIRECT in the last 72 hours. Thyroid Function Tests: No results for input(s): TSH, T4TOTAL, FREET4, T3FREE, THYROIDAB in the last 72 hours. Anemia Panel: No results for input(s): VITAMINB12, FOLATE, FERRITIN, TIBC, IRON, RETICCTPCT in the last 72 hours. Sepsis Labs: Recent Labs  Lab 11/24/19 1412 11/25/19 0454  LATICACIDVEN 2.5* 1.7    Recent Results (from the past 240 hour(s))  Urine culture     Status: Abnormal   Collection Time: 11/24/19  2:12 PM   Specimen: Urine, Random  Result Value Ref Range Status   Specimen Description   Final    URINE, RANDOM Performed at [facility] [address], [place], [place] [address]    Special Requests   Final    NONE Performed at [facility], [place] [address] [place], [place] [address]    Culture MULTIPLE SPECIES  PRESENT, SUGGEST RECOLLECTION (A)  Final   Report Status 11/25/2019 FINAL  Final  Culture, blood (x 2)     Status: None (Preliminary result)   Collection Time: 11/24/19  2:12 PM   Specimen: BLOOD RIGHT HAND  Result Value Ref Range Status   Specimen Description   Final    BLOOD RIGHT HAND Performed at [facility] [address], [place], [place] [address]    Special Requests   Final    BOTTLES DRAWN AEROBIC AND ANAEROBIC Blood Culture results may not be optimal due to an inadequate volume of blood received in culture bottles Performed at [facility] [address], [place], [place] [address]    Culture   Final    NO GROWTH 4 DAYS Performed at [facility], [place] [address], [place], [place] [address]    Report Status PENDING  Incomplete  Respiratory Panel by RT PCR (Flu A&B, Covid) - Urine, Clean  Catch     Status: None   Collection Time: 11/24/19  2:12 PM   Specimen: Urine, Clean Catch  Result Value Ref Range Status   SARS Coronavirus 2 by RT PCR NEGATIVE NEGATIVE Final    Comment: (NOTE) SARS-CoV-2 target nucleic acids are NOT DETECTED. The SARS-CoV-2 RNA is generally detectable in upper respiratoy specimens during the acute phase of infection. The lowest concentration of SARS-CoV-2 viral copies this assay can detect is 131 copies/mL. A negative result does not preclude SARS-Cov-2 infection and should not be used as the sole basis for treatment or other patient management decisions. A negative result may occur with  improper specimen collection/handling, submission of specimen other than nasopharyngeal swab, presence of viral mutation(s) within the areas targeted by this assay, and inadequate number of viral copies (<131 copies/mL). A negative result must be combined with clinical observations, patient history, and epidemiological information. The expected result is Negative. Fact Sheet for Patients:  PinkCheek.be Fact Sheet for Healthcare Providers:  GravelBags.it This test is not yet ap proved or cleared by the [place] FDA and  has been authorized for detection and/or diagnosis of SARS-CoV-2 by FDA under an Emergency Use Authorization (EUA). This EUA will remain  in effect (meaning this test can be used) for the duration of the COVID-19 declaration under Section 564(b)(1) of the Act, 21 U.S.C. section 360bbb-3(b)(1), unless the authorization is terminated or revoked sooner.    Influenza A by PCR NEGATIVE NEGATIVE Final   Influenza B by PCR NEGATIVE NEGATIVE Final    Comment: (NOTE) The Xpert Xpress SARS-CoV-2/FLU/RSV assay is intended as an aid in  the diagnosis of influenza from Nasopharyngeal swab specimens and  should not be used as a sole basis for treatment. Nasal washings and  aspirates are  unacceptable for Xpert Xpress SARS-CoV-2/FLU/RSV  testing. Fact Sheet for Patients: PinkCheek.be Fact Sheet for Healthcare Providers: GravelBags.it This test is not yet approved or cleared by the [place] FDA and  has been authorized for detection and/or diagnosis of SARS-CoV-2 by  FDA under an Emergency Use Authorization (EUA). This EUA will remain  in effect (meaning this test can be used) for the duration of the  Covid-19 declaration under Section 564(b)(1) of the Act, 21  U.S.C. section 360bbb-3(b)(1), unless the authorization is  terminated or revoked. Performed at [facility], [place] [address] [place], [place] [address]   Culture, blood (x 2)     Status: None (Preliminary result)   Collection Time: 11/24/19  2:33 PM  Specimen: BLOOD  Result Value Ref Range Status   Specimen Description   Final    BLOOD LEFT ANTECUBITAL Performed at [facility] [address] [place], [place] [address]    Special Requests   Final    BOTTLES DRAWN AEROBIC AND ANAEROBIC Blood Culture results may not be optimal due to an inadequate volume of blood received in culture bottles Performed at [facility] [address], [place], [place] [address]    Culture   Final    NO GROWTH 4 DAYS Performed at [facility], [place] [address] [place], [place] [address]    Report Status PENDING  Incomplete  SARS CORONAVIRUS 2 (TAT 6-24 HRS) Nasopharyngeal Nasopharyngeal Swab     Status: None   Collection Time: 11/27/19  2:58 PM   Specimen: Nasopharyngeal Swab  Result Value Ref Range Status   SARS Coronavirus 2 NEGATIVE NEGATIVE Final    Comment: (NOTE) SARS-CoV-2 target nucleic acids are NOT DETECTED. The SARS-CoV-2 RNA is generally detectable in upper and lower respiratory specimens during the acute phase of infection. Negative results do not preclude SARS-CoV-2 infection, do not  rule out co-infections with other pathogens, and should not be used as the sole basis for treatment or other patient management decisions. Negative results must be combined with clinical observations, patient history, and epidemiological information. The expected result is Negative. Fact Sheet for Patients: SugarRoll.be Fact Sheet for Healthcare Providers: https://www.woods-mathews.com/ This test is not yet approved or cleared by the [place] FDA and  has been authorized for detection and/or diagnosis of SARS-CoV-2 by FDA under an Emergency Use Authorization (EUA). This EUA will remain  in effect (meaning this test can be used) for the duration of the COVID-19 declaration under Section 56 4(b)(1) of the Act, 21 U.S.C. section 360bbb-3(b)(1), unless the authorization is terminated or revoked sooner. Performed at [facility], [place] [address] [place], [place] [address]          Radiology Studies: US RENAL  Result Date: 11/26/2019 CLINICAL DATA:  Acute renal failure.  History of bladder tumor. EXAM: RENAL / URINARY TRACT ULTRASOUND COMPLETE COMPARISON:  CT abdomen pelvis 02/12/2018 FINDINGS: Right Kidney: Renal measurements: 9.9 x 4.8 x 5.0 cm. = volume: 123 mL. Right upper pole renal calculus 6 mm. Left Kidney: Renal measurements: 9.6 x 4.5 x 4.5 cm = volume: 101 mL. Left midpole cyst 1 cm. Left lower pole renal calculus. Bladder: Appears normal for degree of bladder distention. Other: None. IMPRESSION: Small sized kidneys with normal echogenicity of the renal cortex. No obstruction or mass. Bilateral renal calculi Electronically Signed   By: Franchot Gallo M.D.   On: 11/26/2019 13:34        Scheduled Meds: . apixaban  10 mg Oral BID   Followed by  . [START ON 12/03/2019] apixaban  5 mg Oral BID  . vitamin C  500 mg Oral Daily  . aspirin EC  81 mg Oral Daily  . atorvastatin  20 mg Oral Daily  . donepezil  5 mg Oral BID  .  insulin aspart  0-9 Units Subcutaneous TID WC  . Melatonin  6 mg Oral QHS   Continuous Infusions:   LOS: 4 days    Time spent: 30 minutes    Barb Merino, MD [facility] Pager [phone]

## 2019-11-28 NOTE — Plan of Care (Signed)

## 2019-11-29 LAB — GLUCOSE, CAPILLARY
Glucose-Capillary: 128 mg/dL — ABNORMAL HIGH (ref 70–99)
Glucose-Capillary: 147 mg/dL — ABNORMAL HIGH (ref 70–99)
Glucose-Capillary: 104 mg/dL — ABNORMAL HIGH (ref 70–99)
Glucose-Capillary: 162 mg/dL — ABNORMAL HIGH (ref 70–99)

## 2019-11-29 NOTE — Progress Notes (Signed)
PROGRESS NOTE    [name]  [id number] DOB: [birth year] DOA: 11/24/2019 PCP: Willey Blade, MD    Brief Narrative:  Tony Fox is a 79 y.o.malewith medical history significant ofhypertension, hyperlipidemia, dementia, type 2 diabetes, vitamin D deficiency who presented to [facility] ED from home with his wife due to progressive generalized weakness over 3 to 4 days. At baseline patient ambulates with a cane, wife recently got him a walker. Per report, patient had been seen by PCP recently for increased leg swelling. His HCTZ was changed to Lasix. Wife states patient has no known history of blood clots,but is rather sedentary.  In the emergency room, temperature 99.5.  On room air.Labs notable for creatinine 2.95, BNP normal 21.7, highly sensitive troponin elevated but trended downward (2126-->1642), total CK elevated 1144, lactic acid elevated 2.5.D-dimer elevated 17.2. UA negative for infection.Covid PCR negative. Influenza A/B negative.CBC normal with the exception of mild thrombocytopenia with platelets 141. Noncontrast head CT showed only advanced chronic ischemic changes. Chest x-ray was normal. ECG was normal sinus rhythm at 77 bpm with nonspecific T wave changes. CTA chest was not performed to evaluate for pulmonary embolism due to acute kidney injury. Patient was started empirically on heparin gtt for presumed PE more likely than ACS and IV fluids.  Assessment & Plan:   Principal Problem:   Generalized weakness Active Problems:   Memory loss   Essential hypertension   Hyperlipidemia   Uncomplicated type 2 diabetes mellitus (HCC)   Elevated troponin level not due to acute coronary syndrome   AKI (acute kidney injury) ([facility])   Elevated d-dimer   Lactic acidosis   Thrombocytopenia (HCC)  Acute right lower extremity DVT: With elevated D-dimer.  Unable to have CTA.  VQ scan low to moderate probability. Patient was treated with heparin and  currently transition to Eliquis.  Tolerating well.  Hemoglobin is stable. Will treat with 6 months of anticoagulation.  Elevated troponin due to thromboembolism: Echocardiogram normal.  No evidence of ACS.  On aspirin.  Discontinue aspirin when on Eliquis to avoid bleeding risk.  Physical deconditioning/debility with history of dementia: Patient needs inpatient PT OT.  Refer to skilled nursing facility.  Acute kidney injury on chronic kidney disease stage IIIa: With longstanding history of chronic kidney disease.  Creatinine 1.4 in 2017.  Treated with gentle IV fluids.  Renal ultrasound with normal echogenicity.  No hydronephrosis. Treated with gentle IV fluid with appropriate response 2.95-2.72-2.99-2.35.  This is probably his new baseline.  Discontinue all IV fluids.  Monitor levels. Will need close monitoring.  Recheck tomorrow morning.  Patient will need frequent monitoring.  Dementia: On donepezil and melatonin at night.  Fall and delirium precautions.  Type 2 diabetes: On glipizide Metformin at home.  Currently on sliding scale insulin.  Will go back on oral hypoglycemics on discharge.   DVT prophylaxis: Eliquis Code Status: DNR Family Communication: Patient's wife updated on the phone 1/13. Disposition Plan: Skilled nursing facility when bed available.  Medically stable.  Transfer when bed available.   Consultants:   None  Procedures:   None  Antimicrobials:   None   Subjective: Seen and examined.  No overnight events.  Denies any leg pain.  Somehow confused today. Objective: Vitals:   11/28/19 2126 11/29/19 0439 11/29/19 0442 11/29/19 1336  BP: 126/70  122/80 (!) 146/83  Pulse: 85  66 68  Resp: 20  16 20   Temp: 98.7 F (37.1 C)  (!) 97.4 F (36.3 C) 99 F (  37.2 C)  TempSrc:      SpO2: 92%  99% 100%  Weight:  93 kg      Intake/Output Summary (Last 24 hours) at 11/29/2019 1400 Last data filed at 11/29/2019 1230 Gross per 24 hour  Intake 1299 ml  Output 600  ml  Net 699 ml   Filed Weights   11/27/19 0550 11/28/19 0500 11/29/19 0439  Weight: 92.1 kg 92.2 kg 93 kg    Examination:  General exam: Appears calm and comfortable, on room air. Respiratory system: Clear to auscultation. Respiratory effort normal.  No added sounds Cardiovascular system: S1 & S2 heard, RRR. No JVD, murmurs, rubs, gallops or clicks. No pedal edema.  No localized tenderness. Gastrointestinal system: Abdomen is nondistended, soft and nontender. No organomegaly or masses felt. Normal bowel sounds heard. Central nervous system: Alert and oriented x 1. No focal neurological deficits. Extremities: Symmetric 5 x 5 power. Skin: No rashes, lesions or ulcers Psychiatry: Judgement and insight appear compromised.  Mood & affect appropriate.     Data Reviewed: I have personally reviewed following labs and imaging studies  CBC: Recent Labs  Lab 11/24/19 1412 11/25/19 0454 11/26/19 0458 11/27/19 0613  WBC 7.6 5.8 6.6 7.1  NEUTROABS 5.2  --   --   --   HGB 16.3 14.8 14.4 14.1  HCT 50.0 45.4 44.3 42.2  MCV 89.4 90.8 90.0 90.4  PLT 141* 123* 118* 123XX123*   Basic Metabolic Panel: Recent Labs  Lab 11/24/19 1412 11/25/19 0454 11/26/19 0458 11/27/19 0613  NA 141 142 140 137  K 3.9 4.4 4.7 4.2  CL 104 107 107 108  CO2 22 23 22  20*  GLUCOSE 114* 132* 119* 152*  BUN 49* 46* 51* 42*  CREATININE 2.95* 2.72* 2.99* 2.35*  CALCIUM 9.6 9.0 9.1 8.8*   GFR: CrCl cannot be calculated (Unknown ideal weight.). Liver Function Tests: Recent Labs  Lab 11/24/19 1412  AST 46*  ALT 27  ALKPHOS 48  BILITOT 1.2  PROT 8.0  ALBUMIN 4.2   No results for input(s): LIPASE, AMYLASE in the last 168 hours. No results for input(s): AMMONIA in the last 168 hours. Coagulation Profile: Recent Labs  Lab 11/24/19 1730 11/25/19 0454  INR 1.1 1.1   Cardiac Enzymes: Recent Labs  Lab 11/24/19 1730  CKTOTAL 1,144*   BNP (last 3 results) No results for input(s): PROBNP in the last  8760 hours. HbA1C: No results for input(s): HGBA1C in the last 72 hours. CBG: Recent Labs  Lab 11/28/19 1119 11/28/19 1723 11/28/19 2128 11/29/19 0755 11/29/19 1139  GLUCAP 179* 116* 203* 128* 147*   Lipid Profile: No results for input(s): CHOL, HDL, LDLCALC, TRIG, CHOLHDL, LDLDIRECT in the last 72 hours. Thyroid Function Tests: No results for input(s): TSH, T4TOTAL, FREET4, T3FREE, THYROIDAB in the last 72 hours. Anemia Panel: No results for input(s): VITAMINB12, FOLATE, FERRITIN, TIBC, IRON, RETICCTPCT in the last 72 hours. Sepsis Labs: Recent Labs  Lab 11/24/19 1412 11/25/19 0454  LATICACIDVEN 2.5* 1.7    Recent Results (from the past 240 hour(s))  Urine culture     Status: Abnormal   Collection Time: 11/24/19  2:12 PM   Specimen: Urine, Random  Result Value Ref Range Status   Specimen Description   Final    URINE, RANDOM Performed at [facility] [address], [place], [place] [address]    Special Requests   Final    NONE Performed at [facility], [place] [address] [place],  [place] [address]    Culture MULTIPLE SPECIES PRESENT, SUGGEST RECOLLECTION (A)  Final   Report Status 11/25/2019 FINAL  Final  Culture, blood (x 2)     Status: None (Preliminary result)   Collection Time: 11/24/19  2:12 PM   Specimen: BLOOD RIGHT HAND  Result Value Ref Range Status   Specimen Description   Final    BLOOD RIGHT HAND Performed at [facility] [address] [place], [place] [address]    Special Requests   Final    BOTTLES DRAWN AEROBIC AND ANAEROBIC Blood Culture results may not be optimal due to an inadequate volume of blood received in culture bottles Performed at [facility] [address], [place], [place] [address]    Culture   Final    NO GROWTH 4 DAYS Performed at [facility], [place] [address], [place], [place] [address]    Report Status PENDING  Incomplete  Respiratory Panel  by RT PCR (Flu A&B, Covid) - Urine, Clean Catch     Status: None   Collection Time: 11/24/19  2:12 PM   Specimen: Urine, Clean Catch  Result Value Ref Range Status   SARS Coronavirus 2 by RT PCR NEGATIVE NEGATIVE Final    Comment: (NOTE) SARS-CoV-2 target nucleic acids are NOT DETECTED. The SARS-CoV-2 RNA is generally detectable in upper respiratoy specimens during the acute phase of infection. The lowest concentration of SARS-CoV-2 viral copies this assay can detect is 131 copies/mL. A negative result does not preclude SARS-Cov-2 infection and should not be used as the sole basis for treatment or other patient management decisions. A negative result may occur with  improper specimen collection/handling, submission of specimen other than nasopharyngeal swab, presence of viral mutation(s) within the areas targeted by this assay, and inadequate number of viral copies (<131 copies/mL). A negative result must be combined with clinical observations, patient history, and epidemiological information. The expected result is Negative. Fact Sheet for Patients:  PinkCheek.be Fact Sheet for Healthcare Providers:  GravelBags.it This test is not yet ap proved or cleared by the [place] FDA and  has been authorized for detection and/or diagnosis of SARS-CoV-2 by FDA under an Emergency Use Authorization (EUA). This EUA will remain  in effect (meaning this test can be used) for the duration of the COVID-19 declaration under Section 564(b)(1) of the Act, 21 U.S.C. section 360bbb-3(b)(1), unless the authorization is terminated or revoked sooner.    Influenza A by PCR NEGATIVE NEGATIVE Final   Influenza B by PCR NEGATIVE NEGATIVE Final    Comment: (NOTE) The Xpert Xpress SARS-CoV-2/FLU/RSV assay is intended as an aid in  the diagnosis of influenza from Nasopharyngeal swab specimens and  should not be used as a sole basis for treatment.  Nasal washings and  aspirates are unacceptable for Xpert Xpress SARS-CoV-2/FLU/RSV  testing. Fact Sheet for Patients: PinkCheek.be Fact Sheet for Healthcare Providers: GravelBags.it This test is not yet approved or cleared by the [place] FDA and  has been authorized for detection and/or diagnosis of SARS-CoV-2 by  FDA under an Emergency Use Authorization (EUA). This EUA will remain  in effect (meaning this test can be used) for the duration of the  Covid-19 declaration under Section 564(b)(1) of the Act, 21  U.S.C. section 360bbb-3(b)(1), unless the authorization is  terminated or revoked. Performed at [facility], [place] [address], [place], [place] [address]   Culture, blood (x 2)     Status: None (Preliminary result)  Collection Time: 11/24/19  2:33 PM   Specimen: BLOOD  Result Value Ref Range Status   Specimen Description   Final    BLOOD LEFT ANTECUBITAL Performed at [facility] [address] [place], [place] [address]    Special Requests   Final    BOTTLES DRAWN AEROBIC AND ANAEROBIC Blood Culture results may not be optimal due to an inadequate volume of blood received in culture bottles Performed at [facility] [address] [place], [place] [address]    Culture   Final    NO GROWTH 4 DAYS Performed at [facility], [place] [address], [place], [place] [address]    Report Status PENDING  Incomplete  SARS CORONAVIRUS 2 (TAT 6-24 HRS) Nasopharyngeal Nasopharyngeal Swab     Status: None   Collection Time: 11/27/19  2:58 PM   Specimen: Nasopharyngeal Swab  Result Value Ref Range Status   SARS Coronavirus 2 NEGATIVE NEGATIVE Final    Comment: (NOTE) SARS-CoV-2 target nucleic acids are NOT DETECTED. The SARS-CoV-2 RNA is generally detectable in upper and lower respiratory specimens during the acute phase of infection. Negative results do not  preclude SARS-CoV-2 infection, do not rule out co-infections with other pathogens, and should not be used as the sole basis for treatment or other patient management decisions. Negative results must be combined with clinical observations, patient history, and epidemiological information. The expected result is Negative. Fact Sheet for Patients: SugarRoll.be Fact Sheet for Healthcare Providers: https://www.woods-mathews.com/ This test is not yet approved or cleared by the [place] FDA and  has been authorized for detection and/or diagnosis of SARS-CoV-2 by FDA under an Emergency Use Authorization (EUA). This EUA will remain  in effect (meaning this test can be used) for the duration of the COVID-19 declaration under Section 56 4(b)(1) of the Act, 21 U.S.C. section 360bbb-3(b)(1), unless the authorization is terminated or revoked sooner. Performed at [facility], [place] [address] [place], [place] [address]          Radiology Studies: No results found.      Scheduled Meds: . apixaban  10 mg Oral BID   Followed by  . [START ON 12/03/2019] apixaban  5 mg Oral BID  . vitamin C  500 mg Oral Daily  . aspirin EC  81 mg Oral Daily  . atorvastatin  20 mg Oral Daily  . donepezil  5 mg Oral BID  . insulin aspart  0-9 Units Subcutaneous TID WC  . Melatonin  6 mg Oral QHS   Continuous Infusions:   LOS: 5 days    Time spent: 30 minutes    Barb Merino, MD [facility] Pager [phone]

## 2019-11-29 NOTE — Progress Notes (Signed)
Physical Therapy Treatment Patient Details Name: Tony Fox MRN: [id number] DOB: 1941/04/20 Today's Date: 11/29/2019    History of Present Illness 79 yo male admitted with weakness. (+) dvt R LE.  Hx of dementia.    PT Comments    Pt reoriented to hospital and pain in R LE due to DVT.  Pt agreeable to mobility however resisting assist making mobility challenging (possibly due to pain and/or dementia).  Continue to recommend SNF upon d/c.   Follow Up Recommendations  SNF     Equipment Recommendations  None recommended by PT(per next venue)    Recommendations for Other Services       Precautions / Restrictions Precautions Precautions: Fall    Mobility  Bed Mobility Overal bed mobility: Needs Assistance Bed Mobility: Supine to Sit;Sit to Supine Rolling: Total assist;+2 for physical assistance   Supine to sit: +2 for physical assistance;Total assist Sit to supine: Total assist;+2 for physical assistance   General bed mobility comments: Assist for trunk and bil LEs. Utilized bedpad for scooting, positioning. Very painful R LE. Sat EOB to attempt standing with RW however pt unable then stated he needed to have BM so assisted back to bed to place bed pan (nurse tech notified)  Transfers                 General transfer comment: pt unable  Ambulation/Gait                 Stairs             Wheelchair Mobility    Modified Rankin (Stroke Patients Only)       Balance                                            Cognition Arousal/Alertness: Awake/alert Behavior During Therapy: WFL for tasks assessed/performed Overall Cognitive Status: History of cognitive impairments - at baseline                                 General Comments: pt not orientated to place, situation  (thought he was at [employer])      Exercises      General Comments        Pertinent Vitals/Pain Pain Assessment: Faces Faces Pain Scale:  Hurts worst Pain Location: R leg Pain Descriptors / Indicators: Sharp;Tender;Grimacing;Guarding Pain Intervention(s): Repositioned;Monitored during session    Home Living                      Prior Function            PT Goals (current goals can now be found in the care plan section) Progress towards PT goals: Progressing toward goals    Frequency    Min 2X/week      PT Plan Current plan remains appropriate    Co-evaluation              AM-PAC PT "6 Clicks" Mobility   Outcome Measure  Help needed turning from your back to your side while in a flat bed without using bedrails?: Total Help needed moving from lying on your back to sitting on the side of a flat bed without using bedrails?: Total Help needed moving to and from a bed to a chair (including a wheelchair)?: Total Help needed  standing up from a chair using your arms (Fox.g., wheelchair or bedside chair)?: Total Help needed to walk in hospital room?: Total Help needed climbing 3-5 steps with a railing? : Total 6 Click Score: 6    End of Session Equipment Utilized During Treatment: Gait belt Activity Tolerance: Patient limited by pain Patient left: in bed;with call bell/phone within reach;with bed alarm set Nurse Communication: Mobility status PT Visit Diagnosis: Muscle weakness (generalized) (M62.81);Difficulty in walking, not elsewhere classified (R26.2)     Time: [id number] PT Time Calculation (min) (ACUTE ONLY): 26 min  Charges:  $Therapeutic Activity: 23-37 mins                     Tony Fox PT, DPT Acute Rehabilitation Services Office: [phone]   Tony Fox,Tony Fox 11/29/2019, 4:08 PM

## 2019-11-29 NOTE — TOC Progression Note (Signed)
Transition of Care [facility]) - Progression Note    Patient Details  Name: Tony Fox MRN: [id number] Date of Birth: 04/25/1941  [facility] CM/SW [place], [place] Phone Number: 11/29/2019, 2:08 PM  Clinical Narrative:  Patient accepts most kind bed offer from [place] [facility].  Dr alerted that patient needs COVID test.  Will plan to d/c tomorrow. TOC will continue to follow during the course of hospitalization.      Expected Discharge Plan: Skilled Nursing Facility Barriers to Discharge: SNF Pending bed offer  Expected Discharge Plan and Services Expected Discharge Plan: [place]   Discharge Planning Services: CM Consult   Living arrangements for the past 2 months: Single Family Home                                       Social Determinants of Health (SDOH) Interventions    Readmission Risk Interventions No flowsheet data found.

## 2019-11-30 ENCOUNTER — Inpatient Hospital Stay
Admission: RE | Admit: 2019-11-30 | Discharge: 2019-12-21 | Disposition: A | Discharge status: Home / Self Care | Payer: Medicare PPO | Source: Ambulatory Visit | Attending: Internal Medicine | Admitting: Internal Medicine

## 2019-11-30 LAB — BASIC METABOLIC PANEL
Anion gap: 10 (ref 5–15)
BUN: 33 mg/dL — ABNORMAL HIGH (ref 8–23)
CO2: 23 mmol/L (ref 22–32)
Calcium: 9.3 mg/dL (ref 8.9–10.3)
Chloride: 103 mmol/L (ref 98–111)
Creatinine, Ser: 1.98 mg/dL — ABNORMAL HIGH (ref 0.61–1.24)
GFR calc Af Amer: 36 mL/min — ABNORMAL LOW (ref >60)
GFR calc non Af Amer: 31 mL/min — ABNORMAL LOW (ref >60)
Glucose, Bld: 134 mg/dL — ABNORMAL HIGH (ref 70–99)
Potassium: 4.5 mmol/L (ref 3.5–5.1)
Sodium: 136 mmol/L (ref 135–145)

## 2019-11-30 LAB — GLUCOSE, CAPILLARY
Glucose-Capillary: 122 mg/dL — ABNORMAL HIGH (ref 70–99)
Glucose-Capillary: 155 mg/dL — ABNORMAL HIGH (ref 70–99)
Glucose-Capillary: 80 mg/dL (ref 70–99)

## 2019-11-30 LAB — SARS CORONAVIRUS 2 (TAT 6-24 HRS): SARS Coronavirus 2: NEGATIVE

## 2019-11-30 MED ORDER — HYDRALAZINE HCL 10 MG PO TABS: 25.0000 mg | ORAL_TABLET | Freq: Three times a day (TID) | ORAL | 0 refills | Status: DC

## 2019-11-30 MED ORDER — APIXABAN 5 MG PO TABS: ORAL_TABLET | ORAL | 0 refills | Status: DC

## 2019-11-30 MED ORDER — HYDRALAZINE HCL 10 MG PO TABS
10.0000 mg | ORAL_TABLET | Freq: Three times a day (TID) | ORAL | Status: DC
  Administered 2019-11-30: 17:00:00 10 mg via ORAL
  Filled 2019-11-30 (×3): qty 1

## 2019-11-30 NOTE — Discharge Summary (Signed)
Discharge Summary  Tony Fox [id number] DOB: Nov 14, 1941  PCP: Tony Blade, MD  Admit date: 11/24/2019 Discharge date: 11/30/2019  Time spent: 35 minutes   Recommendations for Outpatient Follow-up:  1. Follow up with your PCP 2. Take your medications as prescribed 3. Fall precautions.  Discharge Diagnoses:  Active Hospital Problems   Diagnosis Date Noted  . Generalized weakness 11/24/2019  . Elevated troponin level not due to acute coronary syndrome 11/24/2019  . AKI (acute kidney injury) ([place]) 11/24/2019  . Elevated d-dimer 11/24/2019  . Lactic acidosis 11/24/2019  . Thrombocytopenia ([place]) 11/24/2019  . Uncomplicated type 2 diabetes mellitus ([place]) 07/16/2015  . Hyperlipidemia 07/16/2015  . Essential hypertension 07/16/2015  . Memory loss 07/16/2015    Resolved Hospital Problems  No resolved problems to display.    Discharge Condition: Stable  Diet recommendation: Heart healthy carb modified diet.  Vitals:   11/30/19 0613 11/30/19 1429  BP:  (!) 145/84  Pulse: 75 66  Resp:    Temp:  99.3 F (37.4 C)  SpO2: 99% 100%    History of present illness:  Tony Lopera Johnsonis a 79 y.o.malewith medical history significant ofhypertension, hyperlipidemia, dementia, type 2 diabetes, vitamin D deficiency who presented to [facility] ED from home with his wife due to progressive generalized weakness over 3 to 4 days. At baseline patient ambulates with a cane, wife recently got him a walker. Per report, patient had been seen by PCP recently for increased leg swelling. His HCTZ was changed to Lasix. Wife states patient has no known history of blood clots,but is rather sedentary.  In the emergency room, temperature 99.5. On room air.Labs notable for creatinine 2.95, BNP normal 21.7, highly sensitive troponin elevated but trended downward (2126-->1642), total CK elevated 1144, lactic acid elevated 2.5.D-dimer elevated 17.2. UA negative for infection.Covid  PCR negative. Influenza A/B negative.CBC normal with the exception of mild thrombocytopenia with platelets 141. Noncontrast head CT showed only advanced chronic ischemic changes. Chest x-ray was normal. ECG was normal sinus rhythm at 77 bpm with nonspecific T wave changes. CTA chest was not performed to evaluate for pulmonary embolism due to acute kidney injury. Patient was started empirically on heparin gtt for presumed PE more likely than ACS and IV fluids.  11/30/19:  Seen and examined.  Alert and other x1.  No new complaints.    Hospital Course:  Principal Problem:   Generalized weakness Active Problems:   Memory loss   Essential hypertension   Hyperlipidemia   Uncomplicated type 2 diabetes mellitus (HCC)   Elevated troponin level not due to acute coronary syndrome   AKI (acute kidney injury) ([place])   Elevated d-dimer   Lactic acidosis   Thrombocytopenia (H[facility])  Acute right lower extremity DVT: With elevated D-dimer. Unable to have CTA. VQ scan low to moderate probability. Patient was treated with heparin and transitioned to Eliquis. Tolerating well. Hemoglobin is stable. Follow-up with your PCP  Elevated troponin due to thromboembolism: Echocardiogram normal. No evidence of ACS. On aspirin. Discontinue aspirin when on Eliquis to avoid bleeding risk. Denies any anginal symptoms at the time of this visit.  Physical deconditioning/debility with history of dementia:  PT recommended SNF.  Continue PT OT with assistance and fall precautions.  Resolving acute kidney injury on chronic kidney disease stage IIIa: Appears to be back to his baseline creatinine 1.9 with GFR of  36  Treated with gentle IV fluids. Renal ultrasound with normal echogenicity. No hydronephrosis. Treated with gentle IV fluid with appropriate response  2.95-2.72-2.99-2.35>> 1.98 on 11/30/2019.   Dementia: On donepezil and melatonin at night. Fall and delirium precautions.  Type 2 diabetes with  hyperglycemia: On glipizide Metformin at home. Currently on sliding scale insulin. Will go back on oral hypoglycemics on discharge.  Hemoglobin A1c 8.7 on 11/25/2019.  Repeat BMP on 12/06/2019.  Follow-up with your primary care provider, if GFR is less than 30 consider DC oral diabetic medication and start insulin.  Essential hypertension Lasix and HCTZ on hold due to recent AKI P.o. hydralazine 10 mg 3 times daily was started. Follow-up with PCP   DVT prophylaxis:Eliquis Code Status:DNR    Discharge Exam: BP (!) 145/84 (BP Location: Left Arm)   Pulse 66   Temp 99.3 F (37.4 C) (Oral)   Resp 20   Wt 91.9 kg   SpO2 100%   BMI 29.07 kg/m  . General: 79 y.o. year-old male well developed well nourished in no acute distress.  Alert and oriented x1.  Pleasantly demented at the time of this exam. . Cardiovascular: Regular rate and rhythm with no rubs or gallops.  No thyromegaly or JVD noted.   Marland Kitchen Respiratory: Clear to auscultation with no wheezes or rales. Good inspiratory effort. . Abdomen: Soft nontender nondistended with normal bowel sounds x4 quadrants. . Musculoskeletal: Trace lower extremity edema.  Marland Kitchen Psychiatry: Mood is appropriate for condition and setting  Discharge Instructions You were cared for by a hospitalist during your hospital stay. If you have any questions about your discharge medications or the care you received while you were in the hospital after you are discharged, you can call the unit and asked to speak with the hospitalist on call if the hospitalist that took care of you is not available. Once you are discharged, your primary care physician will handle any further medical issues. Please note that NO REFILLS for any discharge medications will be authorized once you are discharged, as it is imperative that you return to your primary care physician (or establish a relationship with a primary care physician if you do not have one) for your aftercare needs so that they  can reassess your need for medications and monitor your lab values.   Allergies as of 11/30/2019   No Known Allergies     Medication List    STOP taking these medications   aspirin EC 81 MG tablet   furosemide 20 MG tablet Commonly known as: LASIX   hydrochlorothiazide 25 MG tablet Commonly known as: HYDRODIURIL   ondansetron 4 MG tablet Commonly known as: ZOFRAN     TAKE these medications   apixaban 5 MG Tabs tablet Commonly known as: Eliquis Take 2 tablets (10mg ) twice daily for 7 days, then 1 tablet (5mg ) twice daily   atorvastatin 20 MG tablet Commonly known as: LIPITOR Take 20 mg by mouth daily.   donepezil 5 MG tablet Commonly known as: ARICEPT Take 5 mg by mouth 2 (two) times daily.   glipiZIDE-metformin 2.5-500 MG tablet Commonly known as: METAGLIP Take 1 tablet by mouth daily.   hydrALAZINE 10 MG tablet Commonly known as: APRESOLINE Take 2.5 tablets (25 mg total) by mouth 3 (three) times daily.   QC TUMERIC COMPLEX PO Take 1 capsule by mouth daily.   VITAMIN C PO Take 1 tablet by mouth daily.   Vitamin D (Ergocalciferol) 1.25 MG (50000 UNIT) Caps capsule Commonly known as: DRISDOL Take 50,000 Units by mouth once a week.      No Known Allergies Follow-up Information    Karlton Lemon,  Joelene Millin, MD. Call in 1 day(s).   Specialty: Internal Medicine Why: Please call for a post hospital follow-up appointment. Contact information: [address] [place] [address] [phone]            The results of significant diagnostics from this hospitalization (including imaging, microbiology, ancillary and laboratory) are listed below for reference.    Significant Diagnostic Studies: CT Head Wo Contrast  Result Date: 11/24/2019 CLINICAL DATA:  Altered mental status EXAM: CT HEAD WITHOUT CONTRAST TECHNIQUE: Contiguous axial images were obtained from the base of the skull through the vertex without intravenous contrast. COMPARISON:   Correlation made with MRI brain 2016 FINDINGS: Brain: There is no acute intracranial hemorrhage, mass-effect, or edema. Gray-white differentiation is preserved. There is no extra-axial fluid collection. Prominence of the ventricles and sulci reflects generalized parenchymal volume loss. Disproportionate ventricular prominence is favored to be on an ex vacuo basis. Patchy and confluent areas of hypoattenuation in the supratentorial white matter are nonspecific but probably reflect advanced chronic microvascular ischemic changes. These findings are similar to prior MRI. Vascular: There is atherosclerotic calcification at the skull base. Skull: Calvarium is unremarkable. Sinuses/Orbits: No acute finding. Other: None. IMPRESSION: No acute intracranial abnormality. Advanced chronic microvascular ischemic changes. Electronically Signed   By: Macy Mis M.D.   On: 11/24/2019 15:05   US RENAL  Result Date: 11/26/2019 CLINICAL DATA:  Acute renal failure.  History of bladder tumor. EXAM: RENAL / URINARY TRACT ULTRASOUND COMPLETE COMPARISON:  CT abdomen pelvis 02/12/2018 FINDINGS: Right Kidney: Renal measurements: 9.9 x 4.8 x 5.0 cm. = volume: 123 mL. Right upper pole renal calculus 6 mm. Left Kidney: Renal measurements: 9.6 x 4.5 x 4.5 cm = volume: 101 mL. Left midpole cyst 1 cm. Left lower pole renal calculus. Bladder: Appears normal for degree of bladder distention. Other: None. IMPRESSION: Small sized kidneys with normal echogenicity of the renal cortex. No obstruction or mass. Bilateral renal calculi Electronically Signed   By: Franchot Gallo M.D.   On: 11/26/2019 13:34   NM Pulmonary Perf and Vent  Result Date: 11/25/2019 CLINICAL DATA:  Back pain for several weeks. Elevated D-dimer levels. High clinical suspicion of pulmonary embolism. EXAM: NUCLEAR MEDICINE VENTILATION - PERFUSION LUNG SCAN TECHNIQUE: Ventilation images were obtained in multiple projections using inhaled aerosol Tc-20m DTPA. Perfusion  images were obtained in multiple projections after intravenous injection of Tc-41m MAA. RADIOPHARMACEUTICALS:  42.0 mCi of Tc-1m DTPA aerosol inhalation and 1.64 mCi Tc39m MAA IV COMPARISON:  Portable chest radiographs 11/25/2019 and 11/24/2019. FINDINGS: Ventilation: Multiple small to moderate ventilation defects in both lungs, right greater than left. Perfusion: There are multiple small to moderate perfusion defects in both lungs, nearly precisely matched to the ventilatory defects. No ventilation/perfusion mismatch is identified. The lungs are clear on today's radiographs. IMPRESSION: Multiple small to moderate matched ventilation-perfusion defects in both lungs, nondiagnostic (low or intermediate probability) for pulmonary embolism. Recommend further evaluation with chest CTA and/or lower extremity Doppler ultrasound. Electronically Signed   By: Richardean Sale M.D.   On: 11/25/2019 13:55   DG CHEST PORT 1 VIEW  Result Date: 11/25/2019 CLINICAL DATA:  Dyspnea. EXAM: PORTABLE CHEST 1 VIEW COMPARISON:  November 24, 2019. FINDINGS: The heart size and mediastinal contours are within normal limits. Both lungs are clear. No pneumothorax or pleural effusion is noted. The visualized skeletal structures are unremarkable. IMPRESSION: No active disease. Electronically Signed   By: Marijo Conception M.D.   On: 11/25/2019 11:47   DG Chest  Port 1 View  Result Date: 11/24/2019 CLINICAL DATA:  Progressive weakness.  Back pain. EXAM: PORTABLE CHEST 1 VIEW COMPARISON:  Chest x-ray dated 04/25/2007 FINDINGS: The heart size and mediastinal contours are within normal limits. Both lungs are clear. The visualized skeletal structures are unremarkable. IMPRESSION: Normal exam. Electronically Signed   By: Lorriane Shire M.D.   On: 11/24/2019 14:43   ECHOCARDIOGRAM COMPLETE  Result Date: 11/25/2019   ECHOCARDIOGRAM REPORT   Patient Name:   ARMEND BAUMHARDT Date of Exam: 11/25/2019 Medical Rec #:  [id number]        Height:        70.0 in Accession #:    [id number]       Weight:       202.8 lb Date of Birth:  [birth year]        BSA:          2.10 m Patient Age:    62 years         BP:           101/59 mmHg Patient Gender: M                HR:           73 bpm. Exam Location:  Inpatient Procedure: 2D Echo, Cardiac Doppler and Color Doppler Indications:    Elevated troponin.  History:        Patient has no prior history of Echocardiogram examinations.                 Risk Factors:Hypertension, Diabetes, Dyslipidemia and                 Non-Smoker.  Sonographer:    Paulita Fujita RDCS Referring Phys: [id number] [facility] A GRIFFITH  Sonographer Comments: Image acquisition challenging due to patient body habitus. IMPRESSIONS  1. Left ventricular ejection fraction, by visual estimation, is 55 to 60%. The left ventricle has normal function. There is no left ventricular hypertrophy.  2. Left ventricular diastolic parameters are consistent with Grade I diastolic dysfunction (impaired relaxation).  3. The left ventricle has no regional wall motion abnormalities.  4. Global right ventricle has normal systolic function.The right ventricular size is normal. No increase in right ventricular wall thickness.  5. Left atrial size was normal.  6. Right atrial size was normal.  7. The mitral valve is normal in structure. No evidence of mitral valve regurgitation. No evidence of mitral stenosis.  8. The tricuspid valve is normal in structure.  9. The aortic valve is normal in structure. Aortic valve regurgitation is not visualized. Mild to moderate aortic valve sclerosis/calcification without any evidence of aortic stenosis. 10. Pulmonic regurgitation is mild. 11. The pulmonic valve was normal in structure. Pulmonic valve regurgitation is mild. 12. Moderately elevated pulmonary artery systolic pressure. 13. The tricuspid regurgitant velocity is 3.21 m/s, and with an assumed right atrial pressure of 8 mmHg, the estimated right ventricular systolic pressure is  moderately elevated at 49.2 mmHg. 14. The inferior vena cava is normal in size with greater than 50% respiratory variability, suggesting right atrial pressure of 3 mmHg. FINDINGS  Left Ventricle: Left ventricular ejection fraction, by visual estimation, is 55 to 60%. The left ventricle has normal function. The left ventricle has no regional wall motion abnormalities. There is no left ventricular hypertrophy. Left ventricular diastolic parameters are consistent with Grade I diastolic dysfunction (impaired relaxation). Normal left atrial pressure. Right Ventricle: The right ventricular size is normal. No increase in right ventricular  wall thickness. Global RV systolic function is has normal systolic function. The tricuspid regurgitant velocity is 3.21 m/s, and with an assumed right atrial pressure  of 8 mmHg, the estimated right ventricular systolic pressure is moderately elevated at 49.2 mmHg. Left Atrium: Left atrial size was normal in size. Right Atrium: Right atrial size was normal in size Pericardium: There is no evidence of pericardial effusion. Mitral Valve: The mitral valve is normal in structure. No evidence of mitral valve regurgitation. No evidence of mitral valve stenosis by observation. Tricuspid Valve: The tricuspid valve is normal in structure. Tricuspid valve regurgitation is mild. Aortic Valve: The aortic valve is normal in structure.. There is moderate thickening and moderate calcification of the aortic valve. Aortic valve regurgitation is not visualized. Mild to moderate aortic valve sclerosis/calcification is present, without any evidence of aortic stenosis. There is moderate thickening of the aortic valve. There is moderate calcification of the aortic valve. Pulmonic Valve: The pulmonic valve was normal in structure. Pulmonic valve regurgitation is mild. Pulmonic regurgitation is mild. Aorta: The aortic root, ascending aorta and aortic arch are all structurally normal, with no evidence of  dilitation or obstruction. Venous: The inferior vena cava was not well visualized. The inferior vena cava is normal in size with greater than 50% respiratory variability, suggesting right atrial pressure of 3 mmHg. IAS/Shunts: No atrial level shunt detected by color flow Doppler. There is no evidence of a patent foramen ovale. No ventricular septal defect is seen or detected. There is no evidence of an atrial septal defect.  LEFT VENTRICLE PLAX 2D LVIDd:         4.40 cm       Diastology LVIDs:         3.30 cm       LV e' lateral:   11.00 cm/s LV PW:         0.90 cm       LV E/e' lateral: 3.0 LV IVS:        0.90 cm       LV e' medial:    6.42 cm/s LVOT diam:     1.60 cm       LV E/e' medial:  5.1 LV SV:         44 ml LV SV Index:   20.23 LVOT Area:     2.01 cm  LV Volumes (MOD) LV area d, A2C:    23.50 cm LV area d, A4C:    28.70 cm LV area s, A2C:    14.40 cm LV area s, A4C:    19.50 cm LV major d, A2C:   6.74 cm LV major d, A4C:   7.37 cm LV major s, A2C:   5.01 cm LV major s, A4C:   6.34 cm LV vol d, MOD A2C: 71.4 ml LV vol d, MOD A4C: 98.3 ml LV vol s, MOD A2C: 36.1 ml LV vol s, MOD A4C: 52.9 ml LV SV MOD A2C:     35.3 ml LV SV MOD A4C:     98.3 ml LV SV MOD BP:      38.4 ml RIGHT VENTRICLE RV S prime:     14.30 cm/s TAPSE (M-mode): 1.6 cm LEFT ATRIUM           Index       RIGHT ATRIUM          Index LA diam:      3.50 cm 1.67 cm/m  RA Area:     8.21  cm LA Vol (A2C): 18.1 ml 8.62 ml/m  RA Volume:   12.80 ml 6.10 ml/m LA Vol (A4C): 23.0 ml 10.95 ml/m  AORTIC VALVE LVOT Vmax:   59.90 cm/s LVOT Vmean:  45.000 cm/s LVOT VTI:    0.121 m  AORTA Ao Root diam: 3.20 cm MITRAL VALVE                        TRICUSPID VALVE MV Area (PHT): 2.74 cm             TR Peak grad:   41.2 mmHg MV PHT:        80.33 msec           TR Vmax:        321.00 cm/s MV Decel Time: 277 msec MV E velocity: 32.60 cm/s 103 cm/s  SHUNTS MV A velocity: 50.00 cm/s 70.3 cm/s Systemic VTI:  0.12 m MV E/A ratio:  0.65       1.5       Systemic  Diam: 1.60 cm  Candee Furbish MD Electronically signed by Candee Furbish MD Signature Date/Time: 11/25/2019/3:37:48 PM    Final    VAS [place] LOWER EXTREMITY VENOUS (DVT)  Result Date: 11/25/2019  Lower Venous Study Indications: Elevated Ddimer.  Risk Factors: None identified. Limitations: Body habitus, poor ultrasound/tissue interface and patient immobility, patient positioning. Comparison Study: No prior studies. Performing Technologist: Oliver Hum RVT  Examination Guidelines: A complete evaluation includes B-mode imaging, spectral Doppler, color Doppler, and power Doppler as needed of all accessible portions of each vessel. Bilateral testing is considered an integral part of a complete examination. Limited examinations for reoccurring indications may be performed as noted.  +---------+---------------+---------+-----------+----------+--------------+ RIGHT    CompressibilityPhasicitySpontaneityPropertiesThrombus Aging +---------+---------------+---------+-----------+----------+--------------+ CFV      Full           Yes      Yes                                 +---------+---------------+---------+-----------+----------+--------------+ SFJ      Full                                                        +---------+---------------+---------+-----------+----------+--------------+ FV Prox  None                                         Acute          +---------+---------------+---------+-----------+----------+--------------+ FV Mid   None                                         Acute          +---------+---------------+---------+-----------+----------+--------------+ FV DistalPartial        Yes      Yes                  Acute          +---------+---------------+---------+-----------+----------+--------------+ PFV      Full                                                        +---------+---------------+---------+-----------+----------+--------------+  POP      Partial         Yes      Yes                  Acute          +---------+---------------+---------+-----------+----------+--------------+ PTV      None                                         Acute          +---------+---------------+---------+-----------+----------+--------------+ PERO     None                                                        +---------+---------------+---------+-----------+----------+--------------+   +---------+---------------+---------+-----------+----------+--------------+ LEFT     CompressibilityPhasicitySpontaneityPropertiesThrombus Aging +---------+---------------+---------+-----------+----------+--------------+ CFV      Full           Yes      Yes                                 +---------+---------------+---------+-----------+----------+--------------+ SFJ      Full                                                        +---------+---------------+---------+-----------+----------+--------------+ FV Prox  Full                                                        +---------+---------------+---------+-----------+----------+--------------+ FV Mid   Full                                                        +---------+---------------+---------+-----------+----------+--------------+ FV DistalFull                                                        +---------+---------------+---------+-----------+----------+--------------+ PFV      Full                                                        +---------+---------------+---------+-----------+----------+--------------+ POP      Full           Yes      Yes                                 +---------+---------------+---------+-----------+----------+--------------+  PTV                                                   Not visualized +---------+---------------+---------+-----------+----------+--------------+ PERO                                                  Not  visualized +---------+---------------+---------+-----------+----------+--------------+     Summary: Right: Findings consistent with acute deep vein thrombosis involving the right femoral vein, right popliteal vein, and right posterior tibial veins. No cystic structure found in the popliteal fossa. Left: There is no evidence of deep vein thrombosis in the lower extremity. However, portions of this examination were limited- see technologist comments above. No cystic structure found in the popliteal fossa.  *See table(s) above for measurements and observations. Electronically signed by Curt Jews MD on 11/25/2019 at 5:13:39 PM.    Final     Microbiology: Recent Results (from the past 240 hour(s))  Urine culture     Status: Abnormal   Collection Time: 11/24/19  2:12 PM   Specimen: Urine, Random  Result Value Ref Range Status   Specimen Description   Final    URINE, RANDOM Performed at [facility], [place] [address], [place], [place] [address]    Special Requests   Final    NONE Performed at [facility], [place] [address], [place], [place] [address]    Culture MULTIPLE SPECIES PRESENT, SUGGEST RECOLLECTION (A)  Final   Report Status 11/25/2019 FINAL  Final  Culture, blood (x 2)     Status: None (Preliminary result)   Collection Time: 11/24/19  2:12 PM   Specimen: BLOOD RIGHT HAND  Result Value Ref Range Status   Specimen Description   Final    BLOOD RIGHT HAND Performed at [facility] [address], [place], [place] [address]    Special Requests   Final    BOTTLES DRAWN AEROBIC AND ANAEROBIC Blood Culture results may not be optimal due to an inadequate volume of blood received in culture bottles Performed at [facility] [address] [place], [place] [address]    Culture   Final    NO GROWTH 4 DAYS Performed at [facility], [place] [address] [place], [place] [address]    Report Status PENDING  Incomplete   Respiratory Panel by RT PCR (Flu A&B, Covid) - Urine, Clean Catch     Status: None   Collection Time: 11/24/19  2:12 PM   Specimen: Urine, Clean Catch  Result Value Ref Range Status   SARS Coronavirus 2 by RT PCR NEGATIVE NEGATIVE Final    Comment: (NOTE) SARS-CoV-2 target nucleic acids are NOT DETECTED. The SARS-CoV-2 RNA is generally detectable in upper respiratoy specimens during the acute phase of infection. The lowest concentration of SARS-CoV-2 viral copies this assay can detect is 131 copies/mL. A negative result does not preclude SARS-Cov-2 infection and should not be used as the sole basis for treatment or other patient management decisions. A negative result may occur with  improper specimen collection/handling, submission of specimen other than nasopharyngeal swab, presence of viral mutation(s) within the areas targeted by this assay, and inadequate number of viral copies (<131  copies/mL). A negative result must be combined with clinical observations, patient history, and epidemiological information. The expected result is Negative. Fact Sheet for Patients:  PinkCheek.be Fact Sheet for Healthcare Providers:  GravelBags.it This test is not yet ap proved or cleared by the [place] FDA and  has been authorized for detection and/or diagnosis of SARS-CoV-2 by FDA under an Emergency Use Authorization (EUA). This EUA will remain  in effect (meaning this test can be used) for the duration of the COVID-19 declaration under Section 564(b)(1) of the Act, 21 U.S.C. section 360bbb-3(b)(1), unless the authorization is terminated or revoked sooner.    Influenza A by PCR NEGATIVE NEGATIVE Final   Influenza B by PCR NEGATIVE NEGATIVE Final    Comment: (NOTE) The Xpert Xpress SARS-CoV-2/FLU/RSV assay is intended as an aid in  the diagnosis of influenza from Nasopharyngeal swab specimens and  should not be used as a sole  basis for treatment. Nasal washings and  aspirates are unacceptable for Xpert Xpress SARS-CoV-2/FLU/RSV  testing. Fact Sheet for Patients: PinkCheek.be Fact Sheet for Healthcare Providers: GravelBags.it This test is not yet approved or cleared by the [place] FDA and  has been authorized for detection and/or diagnosis of SARS-CoV-2 by  FDA under an Emergency Use Authorization (EUA). This EUA will remain  in effect (meaning this test can be used) for the duration of the  Covid-19 declaration under Section 564(b)(1) of the Act, 21  U.S.C. section 360bbb-3(b)(1), unless the authorization is  terminated or revoked. Performed at [facility], [place] [address] [place], [place] [address]   Culture, blood (x 2)     Status: None (Preliminary result)   Collection Time: 11/24/19  2:33 PM   Specimen: BLOOD  Result Value Ref Range Status   Specimen Description   Final    BLOOD LEFT ANTECUBITAL Performed at [place] [address] [place], [place] [address]    Special Requests   Final    BOTTLES DRAWN AEROBIC AND ANAEROBIC Blood Culture results may not be optimal due to an inadequate volume of blood received in culture bottles Performed at [facility] [address] [place], [place] [address]    Culture   Final    NO GROWTH 4 DAYS Performed at [facility], [place] [address], [place],  [address]    Report Status PENDING  Incomplete  SARS CORONAVIRUS 2 (TAT 6-24 HRS) Nasopharyngeal Nasopharyngeal Swab     Status: None   Collection Time: 11/27/19  2:58 PM   Specimen: Nasopharyngeal Swab  Result Value Ref Range Status   SARS Coronavirus 2 NEGATIVE NEGATIVE Final    Comment: (NOTE) SARS-CoV-2 target nucleic acids are NOT DETECTED. The SARS-CoV-2 RNA is generally detectable in upper and lower respiratory specimens during the acute phase of infection.  Negative results do not preclude SARS-CoV-2 infection, do not rule out co-infections with other pathogens, and should not be used as the sole basis for treatment or other patient management decisions. Negative results must be combined with clinical observations, patient history, and epidemiological information. The expected result is Negative. Fact Sheet for Patients: SugarRoll.be Fact Sheet for Healthcare Providers: https://www.woods-mathews.com/ This test is not yet approved or cleared by the [place] FDA and  has been authorized for detection and/or diagnosis of SARS-CoV-2 by FDA under an Emergency Use Authorization (EUA). This EUA will remain  in effect (meaning this test can be used) for the duration of the COVID-19 declaration under Section 56  4(b)(1) of the Act, 21 U.S.C. section 360bbb-3(b)(1), unless the authorization is terminated or revoked sooner. Performed at [facility], [place] [address], [place], [place] [address]   SARS CORONAVIRUS 2 (TAT 6-24 HRS) Nasopharyngeal Nasopharyngeal Swab     Status: None   Collection Time: 11/29/19  5:55 PM   Specimen: Nasopharyngeal Swab  Result Value Ref Range Status   SARS Coronavirus 2 NEGATIVE NEGATIVE Final    Comment: (NOTE) SARS-CoV-2 target nucleic acids are NOT DETECTED. The SARS-CoV-2 RNA is generally detectable in upper and lower respiratory specimens during the acute phase of infection. Negative results do not preclude SARS-CoV-2 infection, do not rule out co-infections with other pathogens, and should not be used as the sole basis for treatment or other patient management decisions. Negative results must be combined with clinical observations, patient history, and epidemiological information. The expected result is Negative. Fact Sheet for Patients: SugarRoll.be Fact Sheet for Healthcare  Providers: https://www.woods-mathews.com/ This test is not yet approved or cleared by the [place] FDA and  has been authorized for detection and/or diagnosis of SARS-CoV-2 by FDA under an Emergency Use Authorization (EUA). This EUA will remain  in effect (meaning this test can be used) for the duration of the COVID-19 declaration under Section 56 4(b)(1) of the Act, 21 U.S.C. section 360bbb-3(b)(1), unless the authorization is terminated or revoked sooner. Performed at [facility], [place] [address] [place], [place] [address]      Labs: Basic Metabolic Panel: Recent Labs  Lab 11/24/19 1412 11/25/19 0454 11/26/19 0458 11/27/19 0613 11/30/19 0631  NA 141 142 140 137 136  K 3.9 4.4 4.7 4.2 4.5  CL 104 107 107 108 103  CO2 22 23 22  20* 23  GLUCOSE 114* 132* 119* 152* 134*  BUN 49* 46* 51* 42* 33*  CREATININE 2.95* 2.72* 2.99* 2.35* 1.98*  CALCIUM 9.6 9.0 9.1 8.8* 9.3   Liver Function Tests: Recent Labs  Lab 11/24/19 1412  AST 46*  ALT 27  ALKPHOS 48  BILITOT 1.2  PROT 8.0  ALBUMIN 4.2   No results for input(s): LIPASE, AMYLASE in the last 168 hours. No results for input(s): AMMONIA in the last 168 hours. CBC: Recent Labs  Lab 11/24/19 1412 11/25/19 0454 11/26/19 0458 11/27/19 0613  WBC 7.6 5.8 6.6 7.1  NEUTROABS 5.2  --   --   --   HGB 16.3 14.8 14.4 14.1  HCT 50.0 45.4 44.3 42.2  MCV 89.4 90.8 90.0 90.4  PLT 141* 123* 118* 121*   Cardiac Enzymes: Recent Labs  Lab 11/24/19 1730  CKTOTAL 1,144*   BNP: BNP (last 3 results) Recent Labs    11/24/19 1412  BNP 21.7    ProBNP (last 3 results) No results for input(s): PROBNP in the last 8760 hours.  CBG: Recent Labs  Lab 11/29/19 1139 11/29/19 1622 11/29/19 2201 11/30/19 0742 11/30/19 1139  GLUCAP 147* 104* 162* 122* 155*       Signed:  Kayleen Memos, MD [facility] 11/30/2019, 3:15 PM

## 2019-11-30 NOTE — Progress Notes (Signed)
Report called to [place] at [facility]. Patient ready to be transported, waiting on PTAR. No c/o from patient. Stable. Will continue to monitor.

## 2019-11-30 NOTE — Progress Notes (Signed)
Pt is incontinent of urine and resistant to personal hygiene. Attempts to explain to patient he had soiled the bed & gown was met with anger/ resistance & him fighting the 3 staff it took to clean him. He would not release hold of his gown or our hands / arms when assisting him to turn.Constant Attempts at reassuring him we were there to help not harm

## 2019-11-30 NOTE — Progress Notes (Signed)
PROGRESS NOTE  LUXTON SALONEN [id number] DOB: August 12, 1941 DOA: 11/24/2019 PCP: Willey Blade, MD  HPI/Recap of past 24 hours: Sharlette Dense Johnsonis a 79 y.o.malewith medical history significant ofhypertension, hyperlipidemia, dementia, type 2 diabetes, vitamin D deficiency who presented to [facility] ED from home with his wife due to progressive generalized weakness over 3 to 4 days. At baseline patient ambulates with a cane, wife recently got him a walker. Per report, patient had been seen by PCP recently for increased leg swelling. His HCTZ was changed to Lasix. Wife states patient has no known history of blood clots,but is rather sedentary.  In the emergency room, temperature 99.5.  On room air.Labs notable for creatinine 2.95, BNP normal 21.7, highly sensitive troponin elevated but trended downward (2126-->1642), total CK elevated 1144, lactic acid elevated 2.5.D-dimer elevated 17.2. UA negative for infection.Covid PCR negative. Influenza A/B negative.CBC normal with the exception of mild thrombocytopenia with platelets 141. Noncontrast head CT showed only advanced chronic ischemic changes. Chest x-ray was normal. ECG was normal sinus rhythm at 77 bpm with nonspecific T wave changes. CTA chest was not performed to evaluate for pulmonary embolism due to acute kidney injury. Patient was started empirically on heparin gtt for presumed PE more likely than ACS and IV fluids.  11/30/19: And examined.  Alert and other x1.  No new complaints.  Right lower extremity weakness on exam.  Assessment/Plan: Principal Problem:   Generalized weakness Active Problems:   Memory loss   Essential hypertension   Hyperlipidemia   Uncomplicated type 2 diabetes mellitus (HCC)   Elevated troponin level not due to acute coronary syndrome   AKI (acute kidney injury) ([place])   Elevated d-dimer   Lactic acidosis   Thrombocytopenia (HCC)  Acute right lower extremity DVT: With elevated  D-dimer.  Unable to have CTA.  VQ scan low to moderate probability. Patient was treated with heparin and currently transition to Eliquis.  Tolerating well.  Hemoglobin is stable. Will treat with 6 months of anticoagulation.  Elevated troponin due to thromboembolism: Echocardiogram normal.  No evidence of ACS.  On aspirin.  Discontinue aspirin when on Eliquis to avoid bleeding risk.  Physical deconditioning/debility with history of dementia: Patient needs inpatient PT OT.  Refer to skilled nursing facility.  Acute kidney injury on chronic kidney disease stage IIIa: With longstanding history of chronic kidney disease.  Creatinine 1.4 in 2017.  Treated with gentle IV fluids.  Renal ultrasound with normal echogenicity.  No hydronephrosis. Treated with gentle IV fluid with appropriate response 2.95-2.72-2.99-2.35.  This is probably his new baseline.  Discontinue all IV fluids.  Monitor levels. Will need close monitoring.  Recheck tomorrow morning.  Patient will need frequent monitoring.  Dementia: On donepezil and melatonin at night.  Fall and delirium precautions.  Type 2 diabetes with hyperglycemia: On glipizide Metformin at home.  Currently on sliding scale insulin.  Will go back on oral hypoglycemics on discharge.  Hemoglobin A1c 8.7 on 11/25/2019.   DVT prophylaxis: Eliquis Code Status: DNR Family Communication:  Not at bedside.  We will update his wife. Disposition Plan: Skilled nursing facility when bed available.  Medically stable.  Transfer when bed available.   Consultants:   None  Procedures:   None  Antimicrobials:   None    Objective: Vitals:   11/29/19 2204 11/30/19 0500 11/30/19 0613 11/30/19 0613  BP: 126/85  130/85   Pulse: 64  92 75  Resp: 20  20   Temp: 98 F (36.7 C)  98.7 F (37.1 C)   TempSrc: Oral  Oral   SpO2: 93%  (!) 78% 99%  Weight:  91.9 kg      Intake/Output Summary (Last 24 hours) at 11/30/2019 1420 Last data filed at 11/30/2019  1019 Gross per 24 hour  Intake 1162 ml  Output --  Net 1162 ml   Filed Weights   11/28/19 0500 11/29/19 0439 11/30/19 0500  Weight: 92.2 kg 93 kg 91.9 kg    Exam:  . General: 79 y.o. year-old male well developed well nourished in no acute distress.  Alert and oriented x1. . Cardiovascular: Regular rate and rhythm with no rubs or gallops.  No thyromegaly or JVD noted.   Marland Kitchen Respiratory: Clear to auscultation with no wheezes or rales. Good inspiratory effort. . Abdomen: Soft nontender nondistended with normal bowel sounds x4 quadrants. . Musculoskeletal: Trace lower extremity edema.  3-4 out of 5 strength in the right lower extremity. Marland Kitchen Psychiatry: Mood is appropriate for condition and setting   Data Reviewed: CBC: Recent Labs  Lab 11/24/19 1412 11/25/19 0454 11/26/19 0458 11/27/19 0613  WBC 7.6 5.8 6.6 7.1  NEUTROABS 5.2  --   --   --   HGB 16.3 14.8 14.4 14.1  HCT 50.0 45.4 44.3 42.2  MCV 89.4 90.8 90.0 90.4  PLT 141* 123* 118* 123XX123*   Basic Metabolic Panel: Recent Labs  Lab 11/24/19 1412 11/25/19 0454 11/26/19 0458 11/27/19 0613 11/30/19 0631  NA 141 142 140 137 136  K 3.9 4.4 4.7 4.2 4.5  CL 104 107 107 108 103  CO2 22 23 22  20* 23  GLUCOSE 114* 132* 119* 152* 134*  BUN 49* 46* 51* 42* 33*  CREATININE 2.95* 2.72* 2.99* 2.35* 1.98*  CALCIUM 9.6 9.0 9.1 8.8* 9.3   GFR: CrCl cannot be calculated (Unknown ideal weight.). Liver Function Tests: Recent Labs  Lab 11/24/19 1412  AST 46*  ALT 27  ALKPHOS 48  BILITOT 1.2  PROT 8.0  ALBUMIN 4.2   No results for input(s): LIPASE, AMYLASE in the last 168 hours. No results for input(s): AMMONIA in the last 168 hours. Coagulation Profile: Recent Labs  Lab 11/24/19 1730 11/25/19 0454  INR 1.1 1.1   Cardiac Enzymes: Recent Labs  Lab 11/24/19 1730  CKTOTAL 1,144*   BNP (last 3 results) No results for input(s): PROBNP in the last 8760 hours. HbA1C: No results for input(s): HGBA1C in the last 72  hours. CBG: Recent Labs  Lab 11/29/19 1139 11/29/19 1622 11/29/19 2201 11/30/19 0742 11/30/19 1139  GLUCAP 147* 104* 162* 122* 155*   Lipid Profile: No results for input(s): CHOL, HDL, LDLCALC, TRIG, CHOLHDL, LDLDIRECT in the last 72 hours. Thyroid Function Tests: No results for input(s): TSH, T4TOTAL, FREET4, T3FREE, THYROIDAB in the last 72 hours. Anemia Panel: No results for input(s): VITAMINB12, FOLATE, FERRITIN, TIBC, IRON, RETICCTPCT in the last 72 hours. Urine analysis:    Component Value Date/Time   COLORURINE STRAW (A) 11/24/2019 1412   APPEARANCEUR CLEAR 11/24/2019 1412   LABSPEC 1.010 11/24/2019 1412   PHURINE 5.0 11/24/2019 1412   GLUCOSEU NEGATIVE 11/24/2019 1412   HGBUR NEGATIVE 11/24/2019 [place] 11/24/2019 1412   KETONESUR NEGATIVE 11/24/2019 1412   PROTEINUR NEGATIVE 11/24/2019 1412   UROBILINOGEN 0.2 09/23/2008 0237   NITRITE NEGATIVE 11/24/2019 1412   LEUKOCYTESUR TRACE (A) 11/24/2019 1412   Sepsis Labs: @LABRCNTIP (procalcitonin:4,lacticidven:4)  ) Recent Results (from the past 240 hour(s))  Urine culture     Status: Abnormal  Collection Time: 11/24/19  2:12 PM   Specimen: Urine, Random  Result Value Ref Range Status   Specimen Description   Final    URINE, RANDOM Performed at [place] [address] [place], [place] [address]    Special Requests   Final    NONE Performed at [facility], [place] [address], [place], [place] [address]    Culture MULTIPLE SPECIES PRESENT, SUGGEST RECOLLECTION (A)  Final   Report Status 11/25/2019 FINAL  Final  Culture, blood (x 2)     Status: None (Preliminary result)   Collection Time: 11/24/19  2:12 PM   Specimen: BLOOD RIGHT HAND  Result Value Ref Range Status   Specimen Description   Final    BLOOD RIGHT HAND Performed at [facility] [address] [place], [place] [address]    Special Requests   Final    BOTTLES DRAWN  AEROBIC AND ANAEROBIC Blood Culture results may not be optimal due to an inadequate volume of blood received in culture bottles Performed at [facility] [address] [place], [place] [address]    Culture   Final    NO GROWTH 4 DAYS Performed at [facility], [place] [address] [place], [place] [address]    Report Status PENDING  Incomplete  Respiratory Panel by RT PCR (Flu A&B, Covid) - Urine, Clean Catch     Status: None   Collection Time: 11/24/19  2:12 PM   Specimen: Urine, Clean Catch  Result Value Ref Range Status   SARS Coronavirus 2 by RT PCR NEGATIVE NEGATIVE Final    Comment: (NOTE) SARS-CoV-2 target nucleic acids are NOT DETECTED. The SARS-CoV-2 RNA is generally detectable in upper respiratoy specimens during the acute phase of infection. The lowest concentration of SARS-CoV-2 viral copies this assay can detect is 131 copies/mL. A negative result does not preclude SARS-Cov-2 infection and should not be used as the sole basis for treatment or other patient management decisions. A negative result may occur with  improper specimen collection/handling, submission of specimen other than nasopharyngeal swab, presence of viral mutation(s) within the areas targeted by this assay, and inadequate number of viral copies (<131 copies/mL). A negative result must be combined with clinical observations, patient history, and epidemiological information. The expected result is Negative. Fact Sheet for Patients:  PinkCheek.be Fact Sheet for Healthcare Providers:  GravelBags.it This test is not yet ap proved or cleared by the [place] FDA and  has been authorized for detection and/or diagnosis of SARS-CoV-2 by FDA under an Emergency Use Authorization (EUA). This EUA will remain  in effect (meaning this test can be used) for the duration of the COVID-19 declaration under Section 564(b)(1) of the Act, 21  U.S.C. section 360bbb-3(b)(1), unless the authorization is terminated or revoked sooner.    Influenza A by PCR NEGATIVE NEGATIVE Final   Influenza B by PCR NEGATIVE NEGATIVE Final    Comment: (NOTE) The Xpert Xpress SARS-CoV-2/FLU/RSV assay is intended as an aid in  the diagnosis of influenza from Nasopharyngeal swab specimens and  should not be used as a sole basis for treatment. Nasal washings and  aspirates are unacceptable for Xpert Xpress SARS-CoV-2/FLU/RSV  testing. Fact Sheet for Patients: PinkCheek.be Fact Sheet for Healthcare Providers: GravelBags.it This test is not yet approved or cleared by the [place] FDA and  has been authorized for detection and/or diagnosis of SARS-CoV-2 by  FDA under an Emergency Use Authorization (EUA). This EUA  will remain  in effect (meaning this test can be used) for the duration of the  Covid-19 declaration under Section 564(b)(1) of the Act, 21  U.S.C. section 360bbb-3(b)(1), unless the authorization is  terminated or revoked. Performed at [facility], [place] [address], [place], [place] [address]   Culture, blood (x 2)     Status: None (Preliminary result)   Collection Time: 11/24/19  2:33 PM   Specimen: BLOOD  Result Value Ref Range Status   Specimen Description   Final    BLOOD LEFT ANTECUBITAL Performed at [facility] [address], [place], [place] [address]    Special Requests   Final    BOTTLES DRAWN AEROBIC AND ANAEROBIC Blood Culture results may not be optimal due to an inadequate volume of blood received in culture bottles Performed at [facility] [address], [place], [place] [address]    Culture   Final    NO GROWTH 4 DAYS Performed at [facility], [place] [address] [place], [place] [address]    Report Status PENDING  Incomplete  SARS CORONAVIRUS 2 (TAT 6-24 HRS) Nasopharyngeal Nasopharyngeal  Swab     Status: None   Collection Time: 11/27/19  2:58 PM   Specimen: Nasopharyngeal Swab  Result Value Ref Range Status   SARS Coronavirus 2 NEGATIVE NEGATIVE Final    Comment: (NOTE) SARS-CoV-2 target nucleic acids are NOT DETECTED. The SARS-CoV-2 RNA is generally detectable in upper and lower respiratory specimens during the acute phase of infection. Negative results do not preclude SARS-CoV-2 infection, do not rule out co-infections with other pathogens, and should not be used as the sole basis for treatment or other patient management decisions. Negative results must be combined with clinical observations, patient history, and epidemiological information. The expected result is Negative. Fact Sheet for Patients: SugarRoll.be Fact Sheet for Healthcare Providers: https://www.woods-mathews.com/ This test is not yet approved or cleared by the [place] FDA and  has been authorized for detection and/or diagnosis of SARS-CoV-2 by FDA under an Emergency Use Authorization (EUA). This EUA will remain  in effect (meaning this test can be used) for the duration of the COVID-19 declaration under Section 56 4(b)(1) of the Act, 21 U.S.C. section 360bbb-3(b)(1), unless the authorization is terminated or revoked sooner. Performed at [facility], [place] [address] [place], [place] [address]   SARS CORONAVIRUS 2 (TAT 6-24 HRS) Nasopharyngeal Nasopharyngeal Swab     Status: None   Collection Time: 11/29/19  5:55 PM   Specimen: Nasopharyngeal Swab  Result Value Ref Range Status   SARS Coronavirus 2 NEGATIVE NEGATIVE Final    Comment: (NOTE) SARS-CoV-2 target nucleic acids are NOT DETECTED. The SARS-CoV-2 RNA is generally detectable in upper and lower respiratory specimens during the acute phase of infection. Negative results do not preclude SARS-CoV-2 infection, do not rule out co-infections with other pathogens, and should not be used as  the sole basis for treatment or other patient management decisions. Negative results must be combined with clinical observations, patient history, and epidemiological information. The expected result is Negative. Fact Sheet for Patients: SugarRoll.be Fact Sheet for Healthcare Providers: https://www.woods-mathews.com/ This test is not yet approved or cleared by the [place] FDA and  has been authorized for detection and/or diagnosis of SARS-CoV-2 by FDA under an Emergency Use Authorization (EUA). This EUA will remain  in effect (meaning this test can be used) for the duration of the COVID-19 declaration under Section 56 4(b)(1)  of the Act, 21 U.S.C. section 360bbb-3(b)(1), unless the authorization is terminated or revoked sooner. Performed at [facility], [place] [address] [place], [place] [address]       Studies: No results found.  Scheduled Meds: . apixaban  10 mg Oral BID   Followed by  . [START ON 12/03/2019] apixaban  5 mg Oral BID  . vitamin C  500 mg Oral Daily  . atorvastatin  20 mg Oral Daily  . donepezil  5 mg Oral BID  . insulin aspart  0-9 Units Subcutaneous TID WC  . Melatonin  6 mg Oral QHS    Continuous Infusions:   LOS: 6 days     Kayleen Memos, MD [facility] Pager [phone]  If 7PM-7AM, please contact night-coverage [email or web address] Password [email or web address] 11/30/2019, 2:20 PM

## 2019-11-30 NOTE — TOC Transition Note (Addendum)
Transition of Care [facility]) - CM/SW Discharge Note   Patient Details  Name: Tony Fox MRN: [id number] Date of Birth: 1941-07-17  Transition of Care [facility]) CM/SW Contact:  Servando Snare, LCSW Phone Number: 11/30/2019, 3:26 PM   Clinical Narrative:   LCSW faxed dc docs via hub. Patient to transport to [facility] via EMS. RN report number [phone] PTAR arranged at 3:30. Reported a delayed ETA because they are backed up.     Final next level of care: Skilled Nursing Facility Barriers to Discharge: No Barriers Identified   Patient Goals and CMS Choice   CMS Medicare.gov Compare Post Acute Care list provided to:: Patient Choice offered to / list presented to : Patient, Spouse  Discharge Placement              Patient chooses bed at: [facility] Patient to be transferred to facility by: EMS   Patient and family notified of of transfer: 11/30/19  Discharge Plan and Services   Discharge Planning Services: CM Consult                                 Social Determinants of Health (SDOH) Interventions     Readmission Risk Interventions No flowsheet data found.

## 2019-12-01 LAB — CULTURE, BLOOD (ROUTINE X 2)
Culture: NO GROWTH
Culture: NO GROWTH

## 2019-12-02 ENCOUNTER — Non-Acute Institutional Stay (SKILLED_NURSING_FACILITY): Payer: Medicare PPO | Admitting: Adult Health

## 2019-12-02 ENCOUNTER — Encounter: Payer: Self-pay | Admitting: Adult Health

## 2019-12-02 DIAGNOSIS — E785 Hyperlipidemia, unspecified: Secondary | ICD-10-CM

## 2019-12-02 DIAGNOSIS — R5381 Other malaise: Secondary | ICD-10-CM

## 2019-12-02 DIAGNOSIS — I82411 Acute embolism and thrombosis of right femoral vein: Secondary | ICD-10-CM

## 2019-12-02 DIAGNOSIS — E119 Type 2 diabetes mellitus without complications: Secondary | ICD-10-CM

## 2019-12-02 DIAGNOSIS — E559 Vitamin D deficiency, unspecified: Secondary | ICD-10-CM

## 2019-12-02 DIAGNOSIS — E1169 Type 2 diabetes mellitus with other specified complication: Secondary | ICD-10-CM

## 2019-12-02 DIAGNOSIS — G4733 Obstructive sleep apnea (adult) (pediatric): Secondary | ICD-10-CM

## 2019-12-02 DIAGNOSIS — E1159 Type 2 diabetes mellitus with other circulatory complications: Secondary | ICD-10-CM | POA: Diagnosis not present

## 2019-12-02 DIAGNOSIS — F0150 Vascular dementia without behavioral disturbance: Secondary | ICD-10-CM

## 2019-12-02 DIAGNOSIS — I10 Essential (primary) hypertension: Secondary | ICD-10-CM

## 2019-12-02 NOTE — Progress Notes (Signed)
Location:    [place] Room Number: [id number] Place of Service:  SNF (31) Phillips Grout NP    CODE STATUS: FULL  No Known Allergies  Chief Complaint  Patient presents with  . Hospitalization Follow-up    Hospitalization follow up     HPI:  He is a 79 year old man who has been hospitalized from 11-24-19 through 11-30-19. He presented to the ED for increased weakness. He had been started on lasix at home for lower extremity edema. He was found to have a right lower extremity dvt. He is here for short term rehab with his goal to return back home with his wife. There are no reports of uncontrolled pain; he can be resistant to nursing care. There are no reports of changes in appetite. He will continue to be followed for his chronic illnesses including: diabetes; dyslipidemia; hypertension.   Past Medical History:  Diagnosis Date  . Arthritis   . Borderline diabetes mellitus   . BPH (benign prostatic hypertrophy)   . DDD (degenerative disc disease), lumbar   . GERD (gastroesophageal reflux disease)   . History of bladder cancer   . Hyperlipidemia   . Hypertension   . Nocturia   . OSA on CPAP    PER STUDY  05/2007  MODERATE OSA  . Wears glasses     Past Surgical History:  Procedure Laterality Date  . CYSTOSCOPY W/ RETROGRADES Bilateral 12/30/2014   Procedure: CYSTOSCOPY WITH RETROGRADE PYELOGRAM;  Surgeon: Arvil Persons, MD;  Location: [facility];  Service: Urology;  Laterality: Bilateral;  . CYSTOSCOPY WITH BIOPSY N/A 03/07/2014   Procedure: CYSTOSCOPY WITH Bladder Washings and Bladder BIOPSY;  Surgeon: Lowella Bandy, MD;  Location: [facility];  Service: Urology;  Laterality: N/A;  . CYSTOSCOPY WITH BIOPSY N/A 12/30/2014   Procedure: CYSTOSCOPY WITH BIOPSY;  Surgeon: Arvil Persons, MD;  Location: [facility];  Service: Urology;  Laterality: N/A;  . TRANSURETHRAL RESECTION OF BLADDER TUMOR  12-30-2008//   11-18-2008//    10-08-2008  . TRANSURETHRAL RESECTION OF PROSTATE N/A 12/30/2014   Procedure: TRANSURETHRAL RESECTION PROSTATE BIOPSY;  Surgeon: Arvil Persons, MD;  Location: [facility];  Service: Urology;  Laterality: N/A;    Social History   Socioeconomic History  . Marital status: Married    Spouse name: Not on file  . Number of children: 4  . Years of education: Not on file  . Highest education level: Not on file  Occupational History  . Occupation: Retired  Tobacco Use  . Smoking status: Never Smoker  . Smokeless tobacco: Never Used  Substance and Sexual Activity  . Alcohol use: No    Alcohol/week: 0.0 standard drinks  . Drug use: No  . Sexual activity: Not on file  Other Topics Concern  . Not on file  Social History Narrative  . Not on file   Social Determinants of Health   Financial Resource Strain:   . Difficulty of Paying Living Expenses: Not on file  Food Insecurity:   . Worried About Charity fundraiser in the Last Year: Not on file  . Ran Out of Food in the Last Year: Not on file  Transportation Needs:   . Lack of Transportation (Medical): Not on file  . Lack of Transportation (Non-Medical): Not on file  Physical Activity:   . Days of Exercise per Week: Not on file  . Minutes of Exercise per Session: Not on  file  Stress:   . Feeling of Stress : Not on file  Social Connections:   . Frequency of Communication with Friends and Family: Not on file  . Frequency of Social Gatherings with Friends and Family: Not on file  . Attends Religious Services: Not on file  . Active Member of Clubs or Organizations: Not on file  . Attends Archivist Meetings: Not on file  . Marital Status: Not on file  Intimate Partner Violence:   . Fear of Current or Ex-Partner: Not on file  . Emotionally Abused: Not on file  . Physically Abused: Not on file  . Sexually Abused: Not on file   Family History  Problem Relation Age of Onset  . Alzheimer's disease Mother   .  Stroke Father       VITAL SIGNS BP 131/74   Pulse 67   Temp 98.2 F (36.8 C) (Oral)   Resp 20   Ht 5\' 10"  (1.778 m)   Wt 201 lb (91.2 kg)   SpO2 97%   BMI 28.84 kg/m   Outpatient Encounter Medications as of 12/02/2019  Medication Sig Note  . apixaban (ELIQUIS) 5 MG TABS tablet Take 2 tablets (10mg ) twice daily for 7 days, then 1 tablet (5mg ) twice daily   . ascorbic acid (VITAMIN C) 500 MG tablet Take 500 mg by mouth daily.   Marland Kitchen donepezil (ARICEPT) 5 MG tablet Take 5 mg by mouth 2 (two) times daily.   Marland Kitchen glipiZIDE-metformin (METAGLIP) 2.5-500 MG tablet Take 1 tablet by mouth daily.   . hydrALAZINE (APRESOLINE) 25 MG tablet Take 25 mg by mouth 3 (three) times daily.   . NON FORMULARY Diet - Liquids: _X__Regular; ___Thickened ___ Consistency: ___ Nectar, ___Honey, ___ Pudding; ____ Fluid Restriction General Diet: NAS CON CHO   . NON FORMULARY May use C-pap from home with home settings Twice A Day   . Turmeric (QC TUMERIC COMPLEX PO) Take 1 capsule by mouth daily.   . Vitamin D, Ergocalciferol, (DRISDOL) 1.25 MG (50000 UT) CAPS capsule Take 50,000 Units by mouth once a week. On Monday    No facility-administered encounter medications on file as of 12/02/2019.     SIGNIFICANT DIAGNOSTIC EXAMS  TODAY;   11-24-19: chest x-ray: Normal exam.   11-24-19: ct of head: No acute intracranial abnormality. Advanced chronic microvascular ischemic changes.  11-25-19: NM pulmonary: Multiple small to moderate matched ventilation-perfusion defects in both lungs, nondiagnostic (low or intermediate probability) for pulmonary embolism  11-25-19: 2-d echo:   1. Left ventricular ejection fraction, by visual estimation, is 55 to 60%. The left ventricle has normal function. There is no left ventricular hypertrophy.  2. Left ventricular diastolic parameters are consistent with Grade I diastolic dysfunction (impaired relaxation).  3. The left ventricle has no regional wall motion abnormalities.  4.  Global right ventricle has normal systolic function.The right ventricular size is normal. No increase in right ventricular wall thickness.  5. Left atrial size was normal.  6. Right atrial size was normal.  7. The mitral valve is normal in structure. No evidence of mitral valve regurgitation. No evidence of mitral stenosis.  8. The tricuspid valve is normal in structure.  9. The aortic valve is normal in structure. Aortic valve regurgitation is not visualized. Mild to moderate aortic valve sclerosis/calcification without any evidence of aortic stenosis. 10. Pulmonic regurgitation is mild. 11. The pulmonic valve was normal in structure. Pulmonic valve regurgitation is mild. 12. Moderately elevated pulmonary artery systolic pressure. 13.  The tricuspid regurgitant velocity is 3.21 m/s, and with an assumed right atrial pressure of 8 mmHg, the estimated right ventricular systolic pressure is moderately elevated at 49.2 mmHg. 14. The inferior vena cava is normal in size with greater than 50% respiratory variability, suggesting right atrial pressure of 3 mmHg.  11-25-19: bilateral lower extremity doppler:  Right: Findings consistent with acute deep vein thrombosis involving the right femoral vein, right popliteal vein, and right posterior tibial veins. No cystic structure found in the popliteal fossa. Left: There is no evidence of deep vein thrombosis in the lower extremity. However, portions of this examination were limited- see technologist comments above. No cystic structure found in the popliteal fossa.  11-26-19: renal ultrasound: Small sized kidneys with normal echogenicity of the renal cortex. No obstruction or mass. Bilateral renal calculi  LABS REVIEWED: TODAY;   11-24-19: wbc 7.6; hgb 16.3; hct 50.0; mcv 89.4 plt 141; glucose 114; bun 49; creat 2.95; k+ 3.9; na++ 141; ca 9.6 liver normal albumin 4.2; urine culture; multiple bacteria; blood culture: no growth; Ck 1144.0 d-dimer: 17.20 11-25-19:  hgb a1c 8.7 11-26-19: wbc 6.6; hgb 14.4; hct 44.3; mcv 90.0 plt 118; glucose 119; bun 51; creat 2.99; k+ 4.7; na++ 140; ca 9.1 11-30-19: glucose 134; bun 33; creat 1.98; k+ 4.5; na++ 136; ca 9.3   Review of Systems  Unable to perform ROS: Dementia (unable to participate )    Physical Exam Constitutional:      General: He is not in acute distress.    Appearance: He is well-developed. He is not diaphoretic.  Neck:     Thyroid: No thyromegaly.  Cardiovascular:     Rate and Rhythm: Normal rate and regular rhythm.     Pulses: Normal pulses.     Heart sounds: Normal heart sounds.  Pulmonary:     Effort: Pulmonary effort is normal. No respiratory distress.     Breath sounds: Normal breath sounds.  Abdominal:     General: Bowel sounds are normal. There is no distension.     Palpations: Abdomen is soft.     Tenderness: There is no abdominal tenderness.  Musculoskeletal:        General: Normal range of motion.     Cervical back: Neck supple.     Right lower leg: No edema.     Left lower leg: No edema.  Lymphadenopathy:     Cervical: No cervical adenopathy.  Skin:    General: Skin is warm and dry.  Neurological:     Mental Status: He is alert. Mental status is at baseline.  Psychiatric:        Mood and Affect: Mood normal.        ASSESSMENT/ PLAN:  TODAY;   1. Acute deep vein thrombosis (DVT) of right femoral vein: (11-25-19) is stable will continue eliquis 10 mg twice daily for 7 days then 5 mg twice daily will monitor his status  2. Physical deconditioning: is stable will continue therapy as directed to improve upon his level of independence with his adls  3. Hypertension associated with type 2 diabetes mellitus: is stable b/p 131/74 will continue apresoline 25 mg three times daily   4. Type 2 diabetes mellitus without complication without long term use of insulin: is stable hgb a1c 8.7 will continue metaglip 2.5/500 mg daily is on statin and eliquis  5. OSA: is stable  using cpap nightly   6. Dyslipidemia associated with type 2 diabetes mellitus: is stable will continue lipitor 20 mg  daily   7. Vitamin D Def: is stable will continue vitamin D 50,000 units weekly   8. Vascular dementia without behavioral disturbance: is stable weight is 201 pounds; will continue aricept 5 mg twice daily      MD is aware of resident's narcotic use and is in agreement with current plan of care. We will attempt to wean resident as appropriate.  Ok Edwards NP [facility]  Contact [phone] Monday through Friday 8am- 5pm  After hours call [phone]

## 2019-12-04 ENCOUNTER — Encounter: Payer: Self-pay | Admitting: Internal Medicine

## 2019-12-04 ENCOUNTER — Non-Acute Institutional Stay (SKILLED_NURSING_FACILITY): Payer: Medicare PPO | Admitting: Internal Medicine

## 2019-12-04 DIAGNOSIS — I82411 Acute embolism and thrombosis of right femoral vein: Secondary | ICD-10-CM

## 2019-12-04 DIAGNOSIS — F0150 Vascular dementia without behavioral disturbance: Secondary | ICD-10-CM

## 2019-12-04 DIAGNOSIS — E1169 Type 2 diabetes mellitus with other specified complication: Secondary | ICD-10-CM

## 2019-12-04 DIAGNOSIS — E785 Hyperlipidemia, unspecified: Secondary | ICD-10-CM

## 2019-12-04 DIAGNOSIS — N179 Acute kidney failure, unspecified: Secondary | ICD-10-CM

## 2019-12-04 DIAGNOSIS — E1159 Type 2 diabetes mellitus with other circulatory complications: Secondary | ICD-10-CM

## 2019-12-04 DIAGNOSIS — R531 Weakness: Secondary | ICD-10-CM

## 2019-12-04 DIAGNOSIS — E559 Vitamin D deficiency, unspecified: Secondary | ICD-10-CM | POA: Insufficient documentation

## 2019-12-04 DIAGNOSIS — I152 Hypertension secondary to endocrine disorders: Secondary | ICD-10-CM

## 2019-12-04 DIAGNOSIS — I10 Essential (primary) hypertension: Secondary | ICD-10-CM

## 2019-12-04 DIAGNOSIS — E119 Type 2 diabetes mellitus without complications: Secondary | ICD-10-CM

## 2019-12-04 DIAGNOSIS — G4733 Obstructive sleep apnea (adult) (pediatric): Secondary | ICD-10-CM | POA: Insufficient documentation

## 2019-12-04 DIAGNOSIS — R5381 Other malaise: Secondary | ICD-10-CM | POA: Insufficient documentation

## 2019-12-04 NOTE — Progress Notes (Signed)
: Provider:  Hennie Duos., MD Location:  [place] Room Number: 143-P Place of Service:  SNF ([phone])  PCP: Willey Blade, MD Patient Care Team: Willey Blade, MD as PCP - General (Internal Medicine)  Extended Emergency Contact Information Primary Emergency Contact: Valtierra,Yvonne Address: [place], [place] [address] [place] Phone: [phone] Work Phone: [phone] Mobile Phone: [phone] Relation: Spouse Secondary Emergency Contact: Carmell Austria Mobile Phone: [phone] Relation: Son     Allergies: Patient has no known allergies.  Chief Complaint  Patient presents with  . New Admit To SNF    New admission to [facility]    HPI: Patient is an 79 y.o. male with hypertension, hyperlipidemia, dementia, diabetes mellitus type 2, vitamin D deficiency who presented to [place] long ED from home with his wife due to progressive generalized weakness over the prior 3 to 4 days.  Patient got so weak that his son and to actually pick him up to get it up.  Patient had not had any fever, chills, nausea, vomiting, diarrhea, cough, or known sick contacts.  No family history or history of blood clots.  Per report patient has been seen by PCP recently for increased leg swelling and his HCTZ was changed to Lasix.  Patient had reportedly complained of right-sided chest pain during ED encounter but Hospitalist encounter patient denied chest pain.  Per wife patient has had no chest pain at home.  In the ED temperature 99.5 with O2 sat of 90 to 95% and respiratory rate of 22.  Creatinine 2.95 total CK elevated 1144 D-dimer 17.2, Covid PCR negative.  Noncontrast head CT advanced chronic ischemic changes chest x-ray normal EKG basically normal CTA not performed for PE due to acute kidney injury patient was started empirically on heparin drip.  Patient was admitted to [facility] long hospital from 1/10-16 where  is found to have acute right lower extremity DVT, VQ scan low to moderate probability.  Heparin was transitioned to Eliquis.  Patient had acute kidney injury with a creatinine of 2.95 and a baseline of 1.9 which improved with IV fluids.  There is admitted to skilled nursing facility for OT/PT.  While at skilled nursing facility patient will be followed for diabetes mellitus treated with glipizide/Metformin 2.5 500 mg daily, hypertension treated with hydralazine 25 mg 3 times daily and dementia treated with Aricept  Past Medical History:  Diagnosis Date  . Arthritis   . Borderline diabetes mellitus   . BPH (benign prostatic hypertrophy)   . DDD (degenerative disc disease), lumbar   . GERD (gastroesophageal reflux disease)   . History of bladder cancer   . Hyperlipidemia   . Hypertension   . Nocturia   . OSA on CPAP    PER STUDY  05/2007  MODERATE OSA  . Wears glasses     Past Surgical History:  Procedure Laterality Date  . CYSTOSCOPY W/ RETROGRADES Bilateral 12/30/2014   Procedure: CYSTOSCOPY WITH RETROGRADE PYELOGRAM;  Surgeon: Arvil Persons, MD;  Location: [facility];  Service: Urology;  Laterality: Bilateral;  . CYSTOSCOPY WITH BIOPSY N/A 03/07/2014   Procedure: CYSTOSCOPY WITH Bladder Washings and Bladder BIOPSY;  Surgeon: Lowella Bandy, MD;  Location: [facility];  Service: Urology;  Laterality: N/A;  . CYSTOSCOPY WITH BIOPSY N/A 12/30/2014   Procedure: CYSTOSCOPY WITH BIOPSY;  Surgeon: Arvil Persons, MD;  Location: [facility];  Service: Urology;  Laterality: N/A;  . TRANSURETHRAL RESECTION OF BLADDER TUMOR  12-30-2008//   11-18-2008//   10-08-2008  . TRANSURETHRAL RESECTION OF PROSTATE N/A 12/30/2014   Procedure: TRANSURETHRAL RESECTION PROSTATE BIOPSY;  Surgeon: Arvil Persons, MD;  Location: [facility];  Service: Urology;  Laterality: N/A;    Allergies as of 12/04/2019   No Known Allergies     Medication List    Notice   This  visit is during an admission. Changes to the med list made in this visit will be reflected in the After Visit Summary of the admission.    Current Outpatient Medications on File Prior to Visit  Medication Sig Dispense Refill  . apixaban (ELIQUIS) 5 MG TABS tablet Take 2 tablets (10mg ) twice daily for 7 days, then 1 tablet (5mg ) twice daily 60 tablet 0  . ascorbic acid (VITAMIN C) 500 MG tablet Take 500 mg by mouth daily.    Marland Kitchen atorvastatin (LIPITOR) 20 MG tablet Take 20 mg by mouth at bedtime.    . donepezil (ARICEPT) 5 MG tablet Take 5 mg by mouth 2 (two) times daily.    Marland Kitchen glipiZIDE-metformin (METAGLIP) 2.5-500 MG tablet Take 1 tablet by mouth daily.    . hydrALAZINE (APRESOLINE) 25 MG tablet Take 25 mg by mouth 3 (three) times daily.    . NON FORMULARY Diet - Liquids: _X__Regular; ___Thickened ___ Consistency: ___ Nectar, ___Honey, ___ Pudding; ____ Fluid Restriction General Diet: NAS CON CHO    . NON FORMULARY May use C-pap from home with home settings Twice A Day    . Vitamin D, Ergocalciferol, (DRISDOL) 1.25 MG (50000 UT) CAPS capsule Take 50,000 Units by mouth once a week. On Monday     No current facility-administered medications on file prior to visit.     No orders of the defined types were placed in this encounter.    There is no immunization history on file for this patient.  Social History   Tobacco Use  . Smoking status: Never Smoker  . Smokeless tobacco: Never Used  Substance Use Topics  . Alcohol use: No    Alcohol/week: 0.0 standard drinks    Family history is   Family History  Problem Relation Age of Onset  . Alzheimer's disease Mother   . Stroke Father       Review of Systems    unable to obtain secondary to dementia; nursing-no acute concerns    Vitals:   12/04/19 1251  BP: 132/74  Pulse: 68  Resp: 20  Temp: 98.1 F (36.7 C)  SpO2: 95%    SpO2 Readings from Last 1 Encounters:  12/04/19 95%   Body mass index is 27.37  kg/m.     Physical Exam  GENERAL APPEARANCE: Alert,   No acute distress.  SKIN: No diaphoresis rash HEAD: Normocephalic, atraumatic  EYES: Conjunctiva/lids clear. Pupils round, reactive. EOMs intact.  EARS: External exam WNL, canals clear. Hearing grossly normal.  NOSE: No deformity or discharge.  MOUTH/THROAT: Lips w/o lesions  RESPIRATORY: Breathing is even, unlabored. Lung sounds are clear   CARDIOVASCULAR: Heart RRR no murmurs, rubs or gallops. No peripheral edema.   GASTROINTESTINAL: Abdomen is soft, non-tender, not distended w/ normal bowel sounds. GENITOURINARY: Bladder non tender, not distended  MUSCULOSKELETAL: No abnormal joints or musculature NEUROLOGIC:  Cranial nerves 2-12 grossly intact. Moves all extremities  PSYCHIATRIC: Mood and affect with dementia, no behavioral issues  Patient Active Problem List   Diagnosis Date Noted  .  Acute deep vein thrombosis (DVT) of right femoral vein ([place]) 12/04/2019  . OSA (obstructive sleep apnea) 12/04/2019  . Dyslipidemia associated with type 2 diabetes mellitus ([place]) 12/04/2019  . Vitamin D deficiency 12/04/2019  . Vascular dementia without behavioral disturbance ([place]) 12/04/2019  . Physical deconditioning 12/04/2019  . Elevated troponin level not due to acute coronary syndrome 11/24/2019  . AKI (acute kidney injury) ([place]) 11/24/2019  . Elevated d-dimer 11/24/2019  . Lactic acidosis 11/24/2019  . Generalized weakness 11/24/2019  . Thrombocytopenia ([place]) 11/24/2019  . Memory loss 07/16/2015  . History of bladder cancer 07/16/2015  . Hypertension associated with type 2 diabetes mellitus ([place]) 07/16/2015  . Hyperlipidemia 07/16/2015  . Uncomplicated type 2 diabetes mellitus ([place]) 07/16/2015      Labs reviewed: Basic Metabolic Panel:    Component Value Date/Time   NA 136 11/30/2019 0631   K 4.5 11/30/2019 0631   CL 103 11/30/2019 0631   CO2 23 11/30/2019 0631   GLUCOSE 134 (H) 11/30/2019 0631   BUN 33 (H) 11/30/2019  0631   CREATININE 1.98 (H) 11/30/2019 0631   CALCIUM 9.3 11/30/2019 0631   PROT 8.0 11/24/2019 1412   ALBUMIN 4.2 11/24/2019 1412   AST 46 (H) 11/24/2019 1412   ALT 27 11/24/2019 1412   ALKPHOS 48 11/24/2019 1412   BILITOT 1.2 11/24/2019 1412   GFRNONAA 31 (L) 11/30/2019 0631   GFRAA 36 (L) 11/30/2019 0631    Recent Labs    11/26/19 0458 11/27/19 0613 11/30/19 0631  NA 140 137 136  K 4.7 4.2 4.5  CL 107 108 103  CO2 22 20* 23  GLUCOSE 119* 152* 134*  BUN 51* 42* 33*  CREATININE 2.99* 2.35* 1.98*  CALCIUM 9.1 8.8* 9.3   Liver Function Tests: Recent Labs    11/24/19 1412  AST 46*  ALT 27  ALKPHOS 48  BILITOT 1.2  PROT 8.0  ALBUMIN 4.2   No results for input(s): LIPASE, AMYLASE in the last 8760 hours. No results for input(s): AMMONIA in the last 8760 hours. CBC: Recent Labs    11/24/19 1412 11/24/19 1412 11/25/19 0454 11/26/19 0458 11/27/19 0613  WBC 7.6   < > 5.8 6.6 7.1  NEUTROABS 5.2  --   --   --   --   HGB 16.3   < > 14.8 14.4 14.1  HCT 50.0   < > 45.4 44.3 42.2  MCV 89.4   < > 90.8 90.0 90.4  PLT 141*   < > 123* 118* 121*   < > = values in this interval not displayed.   Lipid No results for input(s): CHOL, HDL, LDLCALC, TRIG in the last 8760 hours.  Cardiac Enzymes: Recent Labs    11/24/19 1730  CKTOTAL 1,144*   BNP: Recent Labs    11/24/19 1412  BNP 21.7   No results found for: [facility] Lab Results  Component Value Date   HGBA1C 8.7 (H) 11/25/2019   Lab Results  Component Value Date   TSH 1.71 07/16/2015   Lab Results  Component Value Date   [id number] 07/16/2015   No results found for: FOLATE No results found for: IRON, TIBC, FERRITIN  Imaging and Procedures obtained prior to SNF admission: No results found.   Not all labs, radiology exams or other studies done during hospitalization come through on my EPIC note; however they are reviewed by me.    Assessment and Plan  Acute DVT right femoral vein./Physical  deconditioning presenting symptom SNF-Eliquis 10 mg  twice daily for 7 days then 5 mg twice daily  Acute kidney injury-creatinine 2.99, 1.9 on discharge after IV fluids SNF-follow-up BMP  Hypertension SNF-stable; continue hydralazine 25 mg 3 times daily  Diabetes mellitus type 2 SNF-not well controlled at 8.7 hemoglobin A1c; continue Metaglip 2.5/500 daily; patient is on statin  Hyperlipidemia associated with diabetes mellitus type 2 SNF-not stated as uncontrolled; continue Lipitor 40 mg daily  Vascular dementia SNF-weight is 200 pounds, stable; continue Aricept 5 mg twice daily  OSA SNF chronic and stable; continue same's nightly CPAP   Time spent greater than 45 minutes;> 50% of time with patient was spent reviewing records, labs, tests and studies, counseling and developing plan of care  Hennie Duos, MD

## 2019-12-04 NOTE — Progress Notes (Signed)
This encounter was created in error - please disregard.

## 2019-12-08 ENCOUNTER — Encounter: Payer: Self-pay | Admitting: Internal Medicine

## 2019-12-17 ENCOUNTER — Non-Acute Institutional Stay (SKILLED_NURSING_FACILITY): Payer: Medicare PPO | Admitting: Adult Health

## 2019-12-17 ENCOUNTER — Encounter: Payer: Self-pay | Admitting: Adult Health

## 2019-12-17 DIAGNOSIS — I82411 Acute embolism and thrombosis of right femoral vein: Secondary | ICD-10-CM | POA: Diagnosis not present

## 2019-12-17 DIAGNOSIS — E1159 Type 2 diabetes mellitus with other circulatory complications: Secondary | ICD-10-CM | POA: Diagnosis not present

## 2019-12-17 DIAGNOSIS — I10 Essential (primary) hypertension: Secondary | ICD-10-CM

## 2019-12-17 DIAGNOSIS — R5381 Other malaise: Secondary | ICD-10-CM | POA: Diagnosis not present

## 2019-12-17 NOTE — Progress Notes (Signed)
Location:    [place] Room Number: [id number] Place of Service:  SNF (31) Phillips Grout NP    CODE STATUS: FULL   No Known Allergies  Chief Complaint  Patient presents with  . Medical Management of Chronic Issues        Acute deep vein thrombosis (CVT) of right femoral vein  Physical deconditioning:    Hypertension associated with type 2 diabetes mellitus:   Weekly follow up for the first 30 days post hospitalization.     HPI:  He is a 79 year old short term rehab patient being seen for the management of his chronic illnesses: dvt; physical deconditioning; hypertension. There are no reports of uncontrolled pain; no changes in appetite; no reports of anxiety or agitation.   Past Medical History:  Diagnosis Date  . Arthritis   . Borderline diabetes mellitus   . BPH (benign prostatic hypertrophy)   . DDD (degenerative disc disease), lumbar   . GERD (gastroesophageal reflux disease)   . History of bladder cancer   . Hyperlipidemia   . Hypertension   . Nocturia   . OSA on CPAP    PER STUDY  05/2007  MODERATE OSA  . Wears glasses     Past Surgical History:  Procedure Laterality Date  . CYSTOSCOPY W/ RETROGRADES Bilateral 12/30/2014   Procedure: CYSTOSCOPY WITH RETROGRADE PYELOGRAM;  Surgeon: Arvil Persons, MD;  Location: [facility];  Service: Urology;  Laterality: Bilateral;  . CYSTOSCOPY WITH BIOPSY N/A 03/07/2014   Procedure: CYSTOSCOPY WITH Bladder Washings and Bladder BIOPSY;  Surgeon: Lowella Bandy, MD;  Location: [facility];  Service: Urology;  Laterality: N/A;  . CYSTOSCOPY WITH BIOPSY N/A 12/30/2014   Procedure: CYSTOSCOPY WITH BIOPSY;  Surgeon: Arvil Persons, MD;  Location: [facility];  Service: Urology;  Laterality: N/A;  . TRANSURETHRAL RESECTION OF BLADDER TUMOR  12-30-2008//   11-18-2008//   10-08-2008  . TRANSURETHRAL RESECTION OF PROSTATE N/A 12/30/2014   Procedure: TRANSURETHRAL RESECTION PROSTATE BIOPSY;   Surgeon: Arvil Persons, MD;  Location: [facility];  Service: Urology;  Laterality: N/A;    Social History   Socioeconomic History  . Marital status: Married    Spouse name: Not on file  . Number of children: 4  . Years of education: Not on file  . Highest education level: Not on file  Occupational History  . Occupation: Retired  Tobacco Use  . Smoking status: Never Smoker  . Smokeless tobacco: Never Used  Substance and Sexual Activity  . Alcohol use: No    Alcohol/week: 0.0 standard drinks  . Drug use: No  . Sexual activity: Not on file  Other Topics Concern  . Not on file  Social History Narrative  . Not on file   Social Determinants of Health   Financial Resource Strain:   . Difficulty of Paying Living Expenses: Not on file  Food Insecurity:   . Worried About Charity fundraiser in the Last Year: Not on file  . Ran Out of Food in the Last Year: Not on file  Transportation Needs:   . Lack of Transportation (Medical): Not on file  . Lack of Transportation (Non-Medical): Not on file  Physical Activity:   . Days of Exercise per Week: Not on file  . Minutes of Exercise per Session: Not on file  Stress:   . Feeling of Stress : Not on file  Social Connections:   .  Frequency of Communication with Friends and Family: Not on file  . Frequency of Social Gatherings with Friends and Family: Not on file  . Attends Religious Services: Not on file  . Active Member of Clubs or Organizations: Not on file  . Attends Archivist Meetings: Not on file  . Marital Status: Not on file  Intimate Partner Violence:   . Fear of Current or Ex-Partner: Not on file  . Emotionally Abused: Not on file  . Physically Abused: Not on file  . Sexually Abused: Not on file   Family History  Problem Relation Age of Onset  . Alzheimer's disease Mother   . Stroke Father       VITAL SIGNS BP 128/70   Pulse 72   Temp (!) 97.1 F (36.2 C) (Oral)   Resp 20   Ht 6'  (1.829 m)   Wt 201 lb 12.8 oz (91.5 kg)   SpO2 95%   BMI 27.37 kg/m   Outpatient Encounter Medications as of 12/17/2019  Medication Sig  . apixaban (ELIQUIS) 5 MG TABS tablet Take 5 mg by mouth 2 (two) times daily.  Marland Kitchen ascorbic acid (VITAMIN C) 500 MG tablet Take 500 mg by mouth daily.  Marland Kitchen atorvastatin (LIPITOR) 20 MG tablet Take 20 mg by mouth at bedtime.  . donepezil (ARICEPT) 5 MG tablet Take 5 mg by mouth 2 (two) times daily.  Marland Kitchen glipiZIDE-metformin (METAGLIP) 2.5-500 MG tablet Take 1 tablet by mouth daily.  . hydrALAZINE (APRESOLINE) 25 MG tablet Take 25 mg by mouth 3 (three) times daily.  . NON FORMULARY Diet - Liquids: _X__Regular; ___Thickened ___ Consistency: ___ Nectar, ___Honey, ___ Pudding; ____ Fluid Restriction General Diet: NAS CON CHO  . QUEtiapine (SEROQUEL) 25 MG tablet Take 12.5 mg by mouth at bedtime.  . Vitamin D, Ergocalciferol, (DRISDOL) 1.25 MG (50000 UT) CAPS capsule Take 50,000 Units by mouth once a week. On Monday  . [DISCONTINUED] apixaban (ELIQUIS) 5 MG TABS tablet Take 2 tablets (10mg ) twice daily for 7 days, then 1 tablet (5mg ) twice daily  . [DISCONTINUED] NON FORMULARY May use C-pap from home with home settings Twice A Day   No facility-administered encounter medications on file as of 12/17/2019.     SIGNIFICANT DIAGNOSTIC EXAMS   PREVIOUS;   11-24-19: chest x-ray: Normal exam.   11-24-19: ct of head: No acute intracranial abnormality. Advanced chronic microvascular ischemic changes.  11-25-19: NM pulmonary: Multiple small to moderate matched ventilation-perfusion defects in both lungs, nondiagnostic (low or intermediate probability) for pulmonary embolism  11-25-19: 2-d echo:   1. Left ventricular ejection fraction, by visual estimation, is 55 to 60%. The left ventricle has normal function. There is no left ventricular hypertrophy.  2. Left ventricular diastolic parameters are consistent with Grade I diastolic dysfunction (impaired relaxation).  3. The  left ventricle has no regional wall motion abnormalities.  4. Global right ventricle has normal systolic function.The right ventricular size is normal. No increase in right ventricular wall thickness.  5. Left atrial size was normal.  6. Right atrial size was normal.  7. The mitral valve is normal in structure. No evidence of mitral valve regurgitation. No evidence of mitral stenosis.  8. The tricuspid valve is normal in structure.  9. The aortic valve is normal in structure. Aortic valve regurgitation is not visualized. Mild to moderate aortic valve sclerosis/calcification without any evidence of aortic stenosis. 10. Pulmonic regurgitation is mild. 11. The pulmonic valve was normal in structure. Pulmonic valve regurgitation is mild.  12. Moderately elevated pulmonary artery systolic pressure. 13. The tricuspid regurgitant velocity is 3.21 m/s, and with an assumed right atrial pressure of 8 mmHg, the estimated right ventricular systolic pressure is moderately elevated at 49.2 mmHg. 14. The inferior vena cava is normal in size with greater than 50% respiratory variability, suggesting right atrial pressure of 3 mmHg.  11-25-19: bilateral lower extremity doppler:  Right: Findings consistent with acute deep vein thrombosis involving the right femoral vein, right popliteal vein, and right posterior tibial veins. No cystic structure found in the popliteal fossa. Left: There is no evidence of deep vein thrombosis in the lower extremity. However, portions of this examination were limited- see technologist comments above. No cystic structure found in the popliteal fossa.  11-26-19: renal ultrasound: Small sized kidneys with normal echogenicity of the renal cortex. No obstruction or mass. Bilateral renal calculi  NO NEW EXAMS.   LABS REVIEWED: PREVIOUS;   11-24-19: wbc 7.6; hgb 16.3; hct 50.0; mcv 89.4 plt 141; glucose 114; bun 49; creat 2.95; k+ 3.9; na++ 141; ca 9.6 liver normal albumin 4.2; urine  culture; multiple bacteria; blood culture: no growth; Ck 1144.0 d-dimer: 17.20 11-25-19: hgb a1c 8.7 11-26-19: wbc 6.6; hgb 14.4; hct 44.3; mcv 90.0 plt 118; glucose 119; bun 51; creat 2.99; k+ 4.7; na++ 140; ca 9.1 11-30-19: glucose 134; bun 33; creat 1.98; k+ 4.5; na++ 136; ca 9.3  NO NEW LABS.    Review of Systems  Unable to perform ROS: Dementia (unable to participate )    Physical Exam Constitutional:      General: He is not in acute distress.    Appearance: He is well-developed. He is not diaphoretic.  Neck:     Thyroid: No thyromegaly.  Cardiovascular:     Rate and Rhythm: Normal rate and regular rhythm.     Pulses: Normal pulses.     Heart sounds: Normal heart sounds.  Pulmonary:     Effort: Pulmonary effort is normal. No respiratory distress.     Breath sounds: Normal breath sounds.  Abdominal:     General: Bowel sounds are normal. There is no distension.     Palpations: Abdomen is soft.     Tenderness: There is no abdominal tenderness.  Musculoskeletal:        General: Normal range of motion.     Cervical back: Neck supple.     Right lower leg: Edema present.     Left lower leg: No edema.  Lymphadenopathy:     Cervical: No cervical adenopathy.  Skin:    General: Skin is warm and dry.  Neurological:     Mental Status: He is alert. Mental status is at baseline.  Psychiatric:        Mood and Affect: Mood normal.      ASSESSMENT/ PLAN:  TODAY;   1. Acute deep vein thrombosis (CVT) of right femoral vein (11-25-19) is stable will continue eliquis 5 mg twice daily   2. Physical deconditioning: is stable will continue therapy as directed to improve upon his level of independence with his adls.   3. Hypertension associated with type 2 diabetes mellitus: is stable b/p 128/70 will continue apresoline 25 mg three times daily   PREVIOUS   4. Type 2 diabetes mellitus without complication without long term use of insulin: is stable hgb a1c 8.7 will continue metaglip  2.5/500 mg daily is on statin and eliquis  5. OSA: is stable using cpap nightly   6. Dyslipidemia associated with type 2  diabetes mellitus: is stable will continue lipitor 20 mg daily   7. Vitamin D Def: is stable will continue vitamin D 50,000 units weekly   8. Vascular dementia without behavioral disturbance: is stable weight is 199 pounds; will continue aricept 5 mg twice daily   9. Psychosis in elderly with behavioral disturbance: is stable will continue seroquel 12.5 mg daily   MD is aware of resident's narcotic use and is in agreement with current plan of care. We will attempt to wean resident as appropriate.  Ok Edwards NP [facility]  Contact [phone] Monday through Friday 8am- 5pm  After hours call [phone]

## 2019-12-18 ENCOUNTER — Encounter: Payer: Self-pay | Admitting: Adult Health

## 2019-12-18 ENCOUNTER — Non-Acute Institutional Stay (SKILLED_NURSING_FACILITY): Payer: Medicare PPO | Admitting: Adult Health

## 2019-12-18 DIAGNOSIS — E1159 Type 2 diabetes mellitus with other circulatory complications: Secondary | ICD-10-CM

## 2019-12-18 DIAGNOSIS — F0150 Vascular dementia without behavioral disturbance: Secondary | ICD-10-CM | POA: Diagnosis not present

## 2019-12-18 DIAGNOSIS — I10 Essential (primary) hypertension: Secondary | ICD-10-CM

## 2019-12-18 DIAGNOSIS — I82411 Acute embolism and thrombosis of right femoral vein: Secondary | ICD-10-CM

## 2019-12-18 NOTE — Progress Notes (Addendum)
Location:    [place] Room Number: [id number] Place of Service:  SNF (31) Phillips Grout NP    CODE STATUS: FULL CODE  No Known Allergies  Chief Complaint  Patient presents with  . Acute Visit    Care Plan Meeting     HPI:  We have come together for his care plan meeting. No bims; mood 0/30. There is family present. His weight is stable. He has had one fall without injury. He is doing well with therapy. His family has setup a hospital bed; walker. There are no reports of pain; no reports of anxiety or agitation. He will be going home soon. He continues to be followed for his chronic illnesses including: dvt; hypertension; dementia.   Past Medical History:  Diagnosis Date  . Arthritis   . Borderline diabetes mellitus   . BPH (benign prostatic hypertrophy)   . DDD (degenerative disc disease), lumbar   . GERD (gastroesophageal reflux disease)   . History of bladder cancer   . Hyperlipidemia   . Hypertension   . Nocturia   . OSA on CPAP    PER STUDY  05/2007  MODERATE OSA  . Wears glasses     Past Surgical History:  Procedure Laterality Date  . CYSTOSCOPY W/ RETROGRADES Bilateral 12/30/2014   Procedure: CYSTOSCOPY WITH RETROGRADE PYELOGRAM;  Surgeon: Arvil Persons, MD;  Location: [facility];  Service: Urology;  Laterality: Bilateral;  . CYSTOSCOPY WITH BIOPSY N/A 03/07/2014   Procedure: CYSTOSCOPY WITH Bladder Washings and Bladder BIOPSY;  Surgeon: Lowella Bandy, MD;  Location: [facility];  Service: Urology;  Laterality: N/A;  . CYSTOSCOPY WITH BIOPSY N/A 12/30/2014   Procedure: CYSTOSCOPY WITH BIOPSY;  Surgeon: Arvil Persons, MD;  Location: [facility];  Service: Urology;  Laterality: N/A;  . TRANSURETHRAL RESECTION OF BLADDER TUMOR  12-30-2008//   11-18-2008//   10-08-2008  . TRANSURETHRAL RESECTION OF PROSTATE N/A 12/30/2014   Procedure: TRANSURETHRAL RESECTION PROSTATE BIOPSY;  Surgeon: Arvil Persons, MD;  Location:  [facility];  Service: Urology;  Laterality: N/A;    Social History   Socioeconomic History  . Marital status: Married    Spouse name: Not on file  . Number of children: 4  . Years of education: Not on file  . Highest education level: Not on file  Occupational History  . Occupation: Retired  Tobacco Use  . Smoking status: Never Smoker  . Smokeless tobacco: Never Used  Substance and Sexual Activity  . Alcohol use: No    Alcohol/week: 0.0 standard drinks  . Drug use: No  . Sexual activity: Not on file  Other Topics Concern  . Not on file  Social History Narrative  . Not on file   Social Determinants of Health   Financial Resource Strain:   . Difficulty of Paying Living Expenses: Not on file  Food Insecurity:   . Worried About Charity fundraiser in the Last Year: Not on file  . Ran Out of Food in the Last Year: Not on file  Transportation Needs:   . Lack of Transportation (Medical): Not on file  . Lack of Transportation (Non-Medical): Not on file  Physical Activity:   . Days of Exercise per Week: Not on file  . Minutes of Exercise per Session: Not on file  Stress:   . Feeling of Stress : Not on file  Social Connections:   . Frequency of Communication with Friends  and Family: Not on file  . Frequency of Social Gatherings with Friends and Family: Not on file  . Attends Religious Services: Not on file  . Active Member of Clubs or Organizations: Not on file  . Attends Archivist Meetings: Not on file  . Marital Status: Not on file  Intimate Partner Violence:   . Fear of Current or Ex-Partner: Not on file  . Emotionally Abused: Not on file  . Physically Abused: Not on file  . Sexually Abused: Not on file   Family History  Problem Relation Age of Onset  . Alzheimer's disease Mother   . Stroke Father       VITAL SIGNS BP 128/70   Pulse 72   Temp (!) 97.1 F (36.2 C) (Oral)   Resp 20   Ht 6' (1.829 m)   Wt 201 lb 12.8 oz (91.5 kg)    SpO2 95%   BMI 27.37 kg/m   Outpatient Encounter Medications as of 12/18/2019  Medication Sig  . apixaban (ELIQUIS) 5 MG TABS tablet Take 5 mg by mouth 2 (two) times daily.  Marland Kitchen ascorbic acid (VITAMIN C) 500 MG tablet Take 500 mg by mouth daily.  Marland Kitchen atorvastatin (LIPITOR) 20 MG tablet Take 20 mg by mouth at bedtime.  . donepezil (ARICEPT) 5 MG tablet Take 5 mg by mouth 2 (two) times daily.  Marland Kitchen glipiZIDE-metformin (METAGLIP) 2.5-500 MG tablet Take 1 tablet by mouth daily.  . hydrALAZINE (APRESOLINE) 25 MG tablet Take 25 mg by mouth 3 (three) times daily.  . NON FORMULARY Diet - Liquids: _X__Regular; ___Thickened ___ Consistency: ___ Nectar, ___Honey, ___ Pudding; ____ Fluid Restriction General Diet: NAS CON CHO  . QUEtiapine (SEROQUEL) 25 MG tablet Take 12.5 mg by mouth at bedtime.  . Vitamin D, Ergocalciferol, (DRISDOL) 1.25 MG (50000 UT) CAPS capsule Take 50,000 Units by mouth once a week. On Monday   No facility-administered encounter medications on file as of 12/18/2019.     SIGNIFICANT DIAGNOSTIC EXAMS  PREVIOUS;   11-24-19: chest x-ray: Normal exam.   11-24-19: ct of head: No acute intracranial abnormality. Advanced chronic microvascular ischemic changes.  11-25-19: NM pulmonary: Multiple small to moderate matched ventilation-perfusion defects in both lungs, nondiagnostic (low or intermediate probability) for pulmonary embolism  11-25-19: 2-d echo:   1. Left ventricular ejection fraction, by visual estimation, is 55 to 60%. The left ventricle has normal function. There is no left ventricular hypertrophy.  2. Left ventricular diastolic parameters are consistent with Grade I diastolic dysfunction (impaired relaxation).  3. The left ventricle has no regional wall motion abnormalities.  4. Global right ventricle has normal systolic function.The right ventricular size is normal. No increase in right ventricular wall thickness.  5. Left atrial size was normal.  6. Right atrial size was  normal.  7. The mitral valve is normal in structure. No evidence of mitral valve regurgitation. No evidence of mitral stenosis.  8. The tricuspid valve is normal in structure.  9. The aortic valve is normal in structure. Aortic valve regurgitation is not visualized. Mild to moderate aortic valve sclerosis/calcification without any evidence of aortic stenosis. 10. Pulmonic regurgitation is mild. 11. The pulmonic valve was normal in structure. Pulmonic valve regurgitation is mild. 12. Moderately elevated pulmonary artery systolic pressure. 13. The tricuspid regurgitant velocity is 3.21 m/s, and with an assumed right atrial pressure of 8 mmHg, the estimated right ventricular systolic pressure is moderately elevated at 49.2 mmHg. 14. The inferior vena cava is normal in size  with greater than 50% respiratory variability, suggesting right atrial pressure of 3 mmHg.  11-25-19: bilateral lower extremity doppler:  Right: Findings consistent with acute deep vein thrombosis involving the right femoral vein, right popliteal vein, and right posterior tibial veins. No cystic structure found in the popliteal fossa. Left: There is no evidence of deep vein thrombosis in the lower extremity. However, portions of this examination were limited- see technologist comments above. No cystic structure found in the popliteal fossa.  11-26-19: renal ultrasound: Small sized kidneys with normal echogenicity of the renal cortex. No obstruction or mass. Bilateral renal calculi  NO NEW EXAMS.   LABS REVIEWED: PREVIOUS;   11-24-19: wbc 7.6; hgb 16.3; hct 50.0; mcv 89.4 plt 141; glucose 114; bun 49; creat 2.95; k+ 3.9; na++ 141; ca 9.6 liver normal albumin 4.2; urine culture; multiple bacteria; blood culture: no growth; Ck 1144.0 d-dimer: 17.20 11-25-19: hgb a1c 8.7 11-26-19: wbc 6.6; hgb 14.4; hct 44.3; mcv 90.0 plt 118; glucose 119; bun 51; creat 2.99; k+ 4.7; na++ 140; ca 9.1 11-30-19: glucose 134; bun 33; creat 1.98; k+ 4.5;  na++ 136; ca 9.3  NO NEW LABS.  Review of Systems  Unable to perform ROS: Dementia (unable to participate )    Physical Exam Constitutional:      General: He is not in acute distress.    Appearance: He is well-developed. He is not diaphoretic.  Neck:     Thyroid: No thyromegaly.  Cardiovascular:     Rate and Rhythm: Normal rate and regular rhythm.     Pulses: Normal pulses.     Heart sounds: Normal heart sounds.  Pulmonary:     Effort: Pulmonary effort is normal. No respiratory distress.     Breath sounds: Normal breath sounds.  Abdominal:     General: Bowel sounds are normal. There is no distension.     Palpations: Abdomen is soft.     Tenderness: There is no abdominal tenderness.  Musculoskeletal:        General: Normal range of motion.     Cervical back: Neck supple.     Right lower leg: Edema present.     Left lower leg: No edema.  Lymphadenopathy:     Cervical: No cervical adenopathy.  Skin:    General: Skin is warm and dry.  Neurological:     Mental Status: He is alert. Mental status is at baseline.  Psychiatric:        Mood and Affect: Mood normal.       ASSESSMENT/ PLAN:  TODAY  1. Hypertension associated with type 2 diabetes mellitus 2. Vascular dementia without behavioral disturbance 3. Acute deep vein thrombosis (DVT) of right femoral vein  Will continue therapy as directed Will continue current medications Will continue to monitor his status.  His goal is to return back home Will not need dme.   ADDENDUM:   We have discussed DVT that eliquis will not "break up" his blood clot but will prevent future clotting. The body will reabsorb the clot over time. Bruising is a side effect of this medication due to being a blood "thinner".     MD is aware of resident's narcotic use and is in agreement with current plan of care. We will attempt to wean resident as appropriate.  Ok Edwards NP [facility]  Contact [phone] Monday  through Friday 8am- 5pm  After hours call [phone]

## 2019-12-19 ENCOUNTER — Encounter: Payer: Self-pay | Admitting: Adult Health

## 2019-12-19 ENCOUNTER — Other Ambulatory Visit: Payer: Self-pay | Admitting: Adult Health

## 2019-12-19 ENCOUNTER — Non-Acute Institutional Stay (SKILLED_NURSING_FACILITY): Payer: Medicare PPO | Admitting: Adult Health

## 2019-12-19 DIAGNOSIS — I82411 Acute embolism and thrombosis of right femoral vein: Secondary | ICD-10-CM | POA: Diagnosis not present

## 2019-12-19 DIAGNOSIS — E1159 Type 2 diabetes mellitus with other circulatory complications: Secondary | ICD-10-CM

## 2019-12-19 DIAGNOSIS — F0150 Vascular dementia without behavioral disturbance: Secondary | ICD-10-CM

## 2019-12-19 DIAGNOSIS — I10 Essential (primary) hypertension: Secondary | ICD-10-CM | POA: Diagnosis not present

## 2019-12-19 MED ORDER — QUETIAPINE FUMARATE 25 MG PO TABS: 12.5000 mg | ORAL_TABLET | Freq: Every day | ORAL | 0 refills | Status: DC

## 2019-12-19 MED ORDER — GLIPIZIDE-METFORMIN HCL 2.5-500 MG PO TABS: 1.0000 | ORAL_TABLET | Freq: Every day | ORAL | 0 refills | Status: DC

## 2019-12-19 MED ORDER — VITAMIN D (ERGOCALCIFEROL) 1.25 MG (50000 UNIT) PO CAPS: 50000.0000 [IU] | ORAL_CAPSULE | ORAL | 0 refills | Status: DC

## 2019-12-19 MED ORDER — HYDRALAZINE HCL 25 MG PO TABS: 25.0000 mg | ORAL_TABLET | Freq: Three times a day (TID) | ORAL | 0 refills | Status: DC

## 2019-12-19 MED ORDER — DONEPEZIL HCL 5 MG PO TABS: 5.0000 mg | ORAL_TABLET | Freq: Two times a day (BID) | ORAL | 0 refills | Status: DC

## 2019-12-19 MED ORDER — ATORVASTATIN CALCIUM 20 MG PO TABS: 20.0000 mg | ORAL_TABLET | Freq: Every day | ORAL | 0 refills | Status: DC

## 2019-12-19 MED ORDER — APIXABAN 5 MG PO TABS: 5.0000 mg | ORAL_TABLET | Freq: Two times a day (BID) | ORAL | 0 refills | Status: DC

## 2019-12-19 NOTE — Progress Notes (Signed)
Location:    [facility] Room Number: [id number] Place of Service:  SNF (31) Phillips Grout NP     CODE STATUS: FULL  No Known Allergies  Chief Complaint  Patient presents with  . Discharge Note    Discharging from Facility on Saturday 12/21/19    HPI:  He is being discharged to home with home health for pt/ot. He will not need any dme; he will need his prescriptions written and will need to follow up with his medical provider. He had been hospitalized for weakness and right leg dvt. He was admitted to this facility for short term rehab. He has participated in the therapy and is ready for discharge to home.    Past Medical History:  Diagnosis Date  . Arthritis   . Borderline diabetes mellitus   . BPH (benign prostatic hypertrophy)   . DDD (degenerative disc disease), lumbar   . GERD (gastroesophageal reflux disease)   . History of bladder cancer   . Hyperlipidemia   . Hypertension   . Nocturia   . OSA on CPAP    PER STUDY  05/2007  MODERATE OSA  . Wears glasses     Past Surgical History:  Procedure Laterality Date  . CYSTOSCOPY W/ RETROGRADES Bilateral 12/30/2014   Procedure: CYSTOSCOPY WITH RETROGRADE PYELOGRAM;  Surgeon: Arvil Persons, MD;  Location: [facility];  Service: Urology;  Laterality: Bilateral;  . CYSTOSCOPY WITH BIOPSY N/A 03/07/2014   Procedure: CYSTOSCOPY WITH Bladder Washings and Bladder BIOPSY;  Surgeon: Lowella Bandy, MD;  Location: [facility];  Service: Urology;  Laterality: N/A;  . CYSTOSCOPY WITH BIOPSY N/A 12/30/2014   Procedure: CYSTOSCOPY WITH BIOPSY;  Surgeon: Arvil Persons, MD;  Location: [facility];  Service: Urology;  Laterality: N/A;  . TRANSURETHRAL RESECTION OF BLADDER TUMOR  12-30-2008//   11-18-2008//   10-08-2008  . TRANSURETHRAL RESECTION OF PROSTATE N/A 12/30/2014   Procedure: TRANSURETHRAL RESECTION PROSTATE BIOPSY;  Surgeon: Arvil Persons, MD;  Location: [facility];   Service: Urology;  Laterality: N/A;    Social History   Socioeconomic History  . Marital status: Married    Spouse name: Not on file  . Number of children: 4  . Years of education: Not on file  . Highest education level: Not on file  Occupational History  . Occupation: Retired  Tobacco Use  . Smoking status: Never Smoker  . Smokeless tobacco: Never Used  Substance and Sexual Activity  . Alcohol use: No    Alcohol/week: 0.0 standard drinks  . Drug use: No  . Sexual activity: Not on file  Other Topics Concern  . Not on file  Social History Narrative  . Not on file   Social Determinants of Health   Financial Resource Strain:   . Difficulty of Paying Living Expenses: Not on file  Food Insecurity:   . Worried About Charity fundraiser in the Last Year: Not on file  . Ran Out of Food in the Last Year: Not on file  Transportation Needs:   . Lack of Transportation (Medical): Not on file  . Lack of Transportation (Non-Medical): Not on file  Physical Activity:   . Days of Exercise per Week: Not on file  . Minutes of Exercise per Session: Not on file  Stress:   . Feeling of Stress : Not on file  Social Connections:   . Frequency of Communication with Friends and Family: Not on  file  . Frequency of Social Gatherings with Friends and Family: Not on file  . Attends Religious Services: Not on file  . Active Member of Clubs or Organizations: Not on file  . Attends Archivist Meetings: Not on file  . Marital Status: Not on file  Intimate Partner Violence:   . Fear of Current or Ex-Partner: Not on file  . Emotionally Abused: Not on file  . Physically Abused: Not on file  . Sexually Abused: Not on file   Family History  Problem Relation Age of Onset  . Alzheimer's disease Mother   . Stroke Father     VITAL SIGNS BP 128/70   Pulse 72   Temp (!) 97.1 F (36.2 C) (Oral)   Resp 20   Ht 6' (1.829 m)   Wt 199 lb 12.8 oz (90.6 kg)   SpO2 95%   BMI 27.10 kg/m    Patient's Medications  New Prescriptions   No medications on file  Previous Medications   APIXABAN (ELIQUIS) 5 MG TABS TABLET    Take 5 mg by mouth 2 (two) times daily.   ASCORBIC ACID (VITAMIN C) 500 MG TABLET    Take 500 mg by mouth daily.   ATORVASTATIN (LIPITOR) 20 MG TABLET    Take 20 mg by mouth at bedtime.   DONEPEZIL (ARICEPT) 5 MG TABLET    Take 5 mg by mouth 2 (two) times daily.   GLIPIZIDE-METFORMIN (METAGLIP) 2.5-500 MG TABLET    Take 1 tablet by mouth daily.   HYDRALAZINE (APRESOLINE) 25 MG TABLET    Take 25 mg by mouth 3 (three) times daily.   NON FORMULARY    Diet - Liquids: _X__Regular; ___Thickened ___ Consistency: ___ Nectar, ___Honey, ___ Pudding; ____ Fluid Restriction General Diet: NAS CON CHO   QUETIAPINE (SEROQUEL) 25 MG TABLET    Take 12.5 mg by mouth at bedtime.   VITAMIN D, ERGOCALCIFEROL, (DRISDOL) 1.25 MG (50000 UT) CAPS CAPSULE    Take 50,000 Units by mouth once a week. On Monday  Modified Medications   No medications on file  Discontinued Medications   No medications on file     SIGNIFICANT DIAGNOSTIC EXAMS   PREVIOUS;   11-24-19: chest x-ray: Normal exam.   11-24-19: ct of head: No acute intracranial abnormality. Advanced chronic microvascular ischemic changes.  11-25-19: NM pulmonary: Multiple small to moderate matched ventilation-perfusion defects in both lungs, nondiagnostic (low or intermediate probability) for pulmonary embolism  11-25-19: 2-d echo:   1. Left ventricular ejection fraction, by visual estimation, is 55 to 60%. The left ventricle has normal function. There is no left ventricular hypertrophy.  2. Left ventricular diastolic parameters are consistent with Grade I diastolic dysfunction (impaired relaxation).  3. The left ventricle has no regional wall motion abnormalities.  4. Global right ventricle has normal systolic function.The right ventricular size is normal. No increase in right ventricular wall thickness.  5. Left atrial size  was normal.  6. Right atrial size was normal.  7. The mitral valve is normal in structure. No evidence of mitral valve regurgitation. No evidence of mitral stenosis.  8. The tricuspid valve is normal in structure.  9. The aortic valve is normal in structure. Aortic valve regurgitation is not visualized. Mild to moderate aortic valve sclerosis/calcification without any evidence of aortic stenosis. 10. Pulmonic regurgitation is mild. 11. The pulmonic valve was normal in structure. Pulmonic valve regurgitation is mild. 12. Moderately elevated pulmonary artery systolic pressure. 13. The tricuspid regurgitant velocity  is 3.21 m/s, and with an assumed right atrial pressure of 8 mmHg, the estimated right ventricular systolic pressure is moderately elevated at 49.2 mmHg. 14. The inferior vena cava is normal in size with greater than 50% respiratory variability, suggesting right atrial pressure of 3 mmHg.  11-25-19: bilateral lower extremity doppler:  Right: Findings consistent with acute deep vein thrombosis involving the right femoral vein, right popliteal vein, and right posterior tibial veins. No cystic structure found in the popliteal fossa. Left: There is no evidence of deep vein thrombosis in the lower extremity. However, portions of this examination were limited- see technologist comments above. No cystic structure found in the popliteal fossa.  11-26-19: renal ultrasound: Small sized kidneys with normal echogenicity of the renal cortex. No obstruction or mass. Bilateral renal calculi  NO NEW EXAMS.   LABS REVIEWED: PREVIOUS;   11-24-19: wbc 7.6; hgb 16.3; hct 50.0; mcv 89.4 plt 141; glucose 114; bun 49; creat 2.95; k+ 3.9; na++ 141; ca 9.6 liver normal albumin 4.2; urine culture; multiple bacteria; blood culture: no growth; Ck 1144.0 d-dimer: 17.20 11-25-19: hgb a1c 8.7 11-26-19: wbc 6.6; hgb 14.4; hct 44.3; mcv 90.0 plt 118; glucose 119; bun 51; creat 2.99; k+ 4.7; na++ 140; ca 9.1 11-30-19:  glucose 134; bun 33; creat 1.98; k+ 4.5; na++ 136; ca 9.3  NO NEW LABS.   Review of Systems  Unable to perform ROS: Dementia (unable to participate )   Physical Exam Constitutional:      General: He is not in acute distress.    Appearance: He is well-developed. He is not diaphoretic.  Neck:     Thyroid: No thyromegaly.  Cardiovascular:     Rate and Rhythm: Normal rate and regular rhythm.     Pulses: Normal pulses.     Heart sounds: Normal heart sounds.  Pulmonary:     Effort: Pulmonary effort is normal. No respiratory distress.     Breath sounds: Normal breath sounds.  Abdominal:     General: Bowel sounds are normal. There is no distension.     Palpations: Abdomen is soft.     Tenderness: There is no abdominal tenderness.  Musculoskeletal:        General: Normal range of motion.     Cervical back: Neck supple.     Right lower leg: Edema present.     Left lower leg: No edema.  Lymphadenopathy:     Cervical: No cervical adenopathy.  Skin:    General: Skin is warm and dry.  Neurological:     Mental Status: He is alert. Mental status is at baseline.  Psychiatric:        Mood and Affect: Mood normal.       ASSESSMENT/ PLAN:   Patient is being discharged with the following home health services:  Pt/ot: to evaluate and treat as indicated for gait balance strength adl training.   Patient is being discharged with the following durable medical equipment:  None needed   Patient has been advised to f/u with their PCP in 1-2 weeks to bring them up to date on their rehab stay.  Social services at facility was responsible for arranging this appointment.  Pt was provided with a 30 day supply of prescriptions for medications and refills must be obtained from their PCP.  For controlled substances, a more limited supply may be provided adequate until PCP appointment only.  A 30 day supply of his prescription medications have been sent to [facility] on [address].  Time spent  with patient 35 minutes: dme; home health medications.     Ok Edwards NP [facility]  Contact [phone] Monday through Friday 8am- 5pm  After hours call [phone]

## 2019-12-25 ENCOUNTER — Emergency Department (HOSPITAL_COMMUNITY)
Admission: EM | Admit: 2019-12-25 | Discharge: 2019-12-25 | Disposition: A | Discharge status: Home / Self Care | Payer: Medicare PPO | Attending: Emergency Medicine | Admitting: Emergency Medicine

## 2019-12-25 ENCOUNTER — Emergency Department (HOSPITAL_COMMUNITY): Payer: Medicare PPO

## 2019-12-25 ENCOUNTER — Encounter (HOSPITAL_COMMUNITY): Payer: Self-pay | Admitting: *Deleted

## 2019-12-25 ENCOUNTER — Other Ambulatory Visit: Payer: Self-pay

## 2019-12-25 DIAGNOSIS — Z7984 Long term (current) use of oral hypoglycemic drugs: Secondary | ICD-10-CM | POA: Diagnosis not present

## 2019-12-25 DIAGNOSIS — R2241 Localized swelling, mass and lump, right lower limb: Secondary | ICD-10-CM | POA: Diagnosis present

## 2019-12-25 DIAGNOSIS — E119 Type 2 diabetes mellitus without complications: Secondary | ICD-10-CM | POA: Insufficient documentation

## 2019-12-25 DIAGNOSIS — Z79899 Other long term (current) drug therapy: Secondary | ICD-10-CM | POA: Insufficient documentation

## 2019-12-25 DIAGNOSIS — I10 Essential (primary) hypertension: Secondary | ICD-10-CM | POA: Diagnosis not present

## 2019-12-25 DIAGNOSIS — M7989 Other specified soft tissue disorders: Secondary | ICD-10-CM

## 2019-12-25 LAB — CBC WITH DIFFERENTIAL/PLATELET
Abs Immature Granulocytes: 0.01 10*3/uL (ref 0.00–0.07)
Basophils Absolute: 0 10*3/uL (ref 0.0–0.1)
Basophils Relative: 0 %
Eosinophils Absolute: 0.1 10*3/uL (ref 0.0–0.5)
Eosinophils Relative: 2 %
HCT: 43.5 % (ref 39.0–52.0)
Hemoglobin: 13.9 g/dL (ref 13.0–17.0)
Immature Granulocytes: 0 %
Lymphocytes Relative: 26 %
Lymphs Abs: 1.5 10*3/uL (ref 0.7–4.0)
MCH: 29.3 pg (ref 26.0–34.0)
MCHC: 32 g/dL (ref 30.0–36.0)
MCV: 91.6 fL (ref 80.0–100.0)
Monocytes Absolute: 0.6 10*3/uL (ref 0.1–1.0)
Monocytes Relative: 10 %
Neutro Abs: 3.5 10*3/uL (ref 1.7–7.7)
Neutrophils Relative %: 62 %
Platelets: 195 10*3/uL (ref 150–400)
RBC: 4.75 MIL/uL (ref 4.22–5.81)
RDW: 13.1 % (ref 11.5–15.5)
WBC: 5.7 10*3/uL (ref 4.0–10.5)
nRBC: 0 % (ref 0.0–0.2)

## 2019-12-25 LAB — I-STAT CHEM 8, ED
BUN: 38 mg/dL — ABNORMAL HIGH (ref 8–23)
Calcium, Ion: 1.26 mmol/L (ref 1.15–1.40)
Chloride: 107 mmol/L (ref 98–111)
Creatinine, Ser: 2.9 mg/dL — ABNORMAL HIGH (ref 0.61–1.24)
Glucose, Bld: 141 mg/dL — ABNORMAL HIGH (ref 70–99)
HCT: 43 % (ref 39.0–52.0)
Hemoglobin: 14.6 g/dL (ref 13.0–17.0)
Potassium: 4.8 mmol/L (ref 3.5–5.1)
Sodium: 143 mmol/L (ref 135–145)
TCO2: 25 mmol/L (ref 22–32)

## 2019-12-25 NOTE — ED Triage Notes (Signed)
Pt BIB EMS and coming from home.  Pt lives with wife and grandchildren.  In early January pt was evaluated for right ankle swelling.  A DVT was found and subsequently remove.  Pt was admitted for a week and then discharged to a rehab facility for the past month.  Pt was recently discharged home 3 or 4 days ago. Since being home, pt's ankle swelling has become worse.  Pt was pitting edema and in unable to bear weight on ankle.  Pt reports pain when attempting to bear weight on right ankle, knee, and hip. Pt denies pain when foot is elevated. Hx dementia and is a/o to person, place, and event.

## 2019-12-25 NOTE — ED Notes (Signed)
Ted hose placed on patient per order.

## 2019-12-25 NOTE — ED Notes (Signed)
[facility] called for transport.  

## 2019-12-25 NOTE — Discharge Instructions (Signed)
The swelling in the right leg is due to a DVT previously diagnosed.  Wear TED hose for compression.  Keep leg elevated while at rest.  Take over the counter tylenol as needed for pain.

## 2019-12-25 NOTE — ED Notes (Signed)
Pt changed out of wet depends. Pt had 2 pairs on, dirty removed and clean pair left on.

## 2019-12-25 NOTE — ED Provider Notes (Signed)
[facility] DEPT Provider Note   CSN: [id number] Arrival date & time: 12/25/19  1207     History No chief complaint on file.   Tony Fox is a 79 y.o. male.  The history is provided by the patient, a relative and medical records. No language interpreter was used.     79 year old male with history of dementia, diabetes, GERD, DDD, history of bladder cancer, recently diagnosed with right lower extremity DVT involving the right femoral vein brought here via EMS from home for evaluation of right ankle swelling.  Due to dementia, history was limited.  I did discuss with patient's daughter over the phone.  Patient was having trouble walking on his right leg last month, and he was seen in the hospital and eventually diagnosed with having a DVT to his right lower extremities.  He is currently on Eliquis for that.  He was discharged in the hospital 4 days ago and daughter noticed that he is still having pain and trouble ambulating due to swelling and bruising of his right lower extremity.  Daughter mention she call his PCP who recommend patient to be evaluated in the ER to make sure he does not have any broken bones.  No other complaint.  Patient does not have any specific complaint.  Patient denies any chest pain or trouble breathing.  Denies any fever chills.  Past Medical History:  Diagnosis Date  . Arthritis   . Borderline diabetes mellitus   . BPH (benign prostatic hypertrophy)   . DDD (degenerative disc disease), lumbar   . GERD (gastroesophageal reflux disease)   . History of bladder cancer   . Hyperlipidemia   . Hypertension   . Nocturia   . OSA on CPAP    PER STUDY  05/2007  MODERATE OSA  . Wears glasses     Patient Active Problem List   Diagnosis Date Noted  . Acute deep vein thrombosis (DVT) of right femoral vein ([place]) 12/04/2019  . OSA (obstructive sleep apnea) 12/04/2019  . Dyslipidemia associated with type 2 diabetes mellitus ([place])  12/04/2019  . Vitamin D deficiency 12/04/2019  . Vascular dementia without behavioral disturbance ([place]) 12/04/2019  . Physical deconditioning 12/04/2019  . Elevated troponin level not due to acute coronary syndrome 11/24/2019  . AKI (acute kidney injury) ([place]) 11/24/2019  . Elevated d-dimer 11/24/2019  . Lactic acidosis 11/24/2019  . Generalized weakness 11/24/2019  . Thrombocytopenia ([place]) 11/24/2019  . Memory loss 07/16/2015  . History of bladder cancer 07/16/2015  . Hypertension associated with type 2 diabetes mellitus ([place]) 07/16/2015  . Hyperlipidemia 07/16/2015  . Uncomplicated type 2 diabetes mellitus ([place]) 07/16/2015    Past Surgical History:  Procedure Laterality Date  . CYSTOSCOPY W/ RETROGRADES Bilateral 12/30/2014   Procedure: CYSTOSCOPY WITH RETROGRADE PYELOGRAM;  Surgeon: Arvil Persons, MD;  Location: [facility];  Service: Urology;  Laterality: Bilateral;  . CYSTOSCOPY WITH BIOPSY N/A 03/07/2014   Procedure: CYSTOSCOPY WITH Bladder Washings and Bladder BIOPSY;  Surgeon: Lowella Bandy, MD;  Location: [facility];  Service: Urology;  Laterality: N/A;  . CYSTOSCOPY WITH BIOPSY N/A 12/30/2014   Procedure: CYSTOSCOPY WITH BIOPSY;  Surgeon: Arvil Persons, MD;  Location: [facility];  Service: Urology;  Laterality: N/A;  . TRANSURETHRAL RESECTION OF BLADDER TUMOR  12-30-2008//   11-18-2008//   10-08-2008  . TRANSURETHRAL RESECTION OF PROSTATE N/A 12/30/2014   Procedure: TRANSURETHRAL RESECTION PROSTATE BIOPSY;  Surgeon: Arvil Persons, MD;  Location:  [facility];  Service: Urology;  Laterality: N/A;       Family History  Problem Relation Age of Onset  . Alzheimer's disease Mother   . Stroke Father     Social History   Tobacco Use  . Smoking status: Never Smoker  . Smokeless tobacco: Never Used  Substance Use Topics  . Alcohol use: No    Alcohol/week: 0.0 standard drinks  . Drug use: No    Home Medications Prior to  Admission medications   Medication Sig Start Date End Date Taking? Authorizing Provider  apixaban (ELIQUIS) 5 MG TABS tablet Take 1 tablet (5 mg total) by mouth 2 (two) times daily. 12/19/19   Gerlene Fee, NP  ascorbic acid (VITAMIN C) 500 MG tablet Take 500 mg by mouth daily.    [provider]  atorvastatin (LIPITOR) 20 MG tablet Take 1 tablet (20 mg total) by mouth at bedtime. 12/19/19   Gerlene Fee, NP  donepezil (ARICEPT) 5 MG tablet Take 1 tablet (5 mg total) by mouth 2 (two) times daily. 12/19/19   Gerlene Fee, NP  glipiZIDE-metformin (METAGLIP) 2.5-500 MG tablet Take 1 tablet by mouth daily. 12/19/19   Gerlene Fee, NP  hydrALAZINE (APRESOLINE) 25 MG tablet Take 1 tablet (25 mg total) by mouth 3 (three) times daily. 12/19/19   Gerlene Fee, NP  NON FORMULARY Diet - Liquids: _X__Regular; ___Thickened ___ Consistency: ___ Nectar, ___Honey, ___ Pudding; ____ Fluid Restriction General Diet: NAS CON CHO    [provider]  QUEtiapine (SEROQUEL) 25 MG tablet Take 0.5 tablets (12.5 mg total) by mouth at bedtime. 12/19/19   Gerlene Fee, NP  Vitamin D, Ergocalciferol, (DRISDOL) 1.25 MG (50000 UNIT) CAPS capsule Take 1 capsule (50,000 Units total) by mouth once a week. On Monday 12/19/19   Gerlene Fee, NP    Allergies    Patient has no known allergies.  Review of Systems   Review of Systems  Unable to perform ROS: Dementia    Physical Exam Updated Vital Signs BP (!) 98/59 (BP Location: Left Arm)   Pulse 71   Temp 98.1 F (36.7 C) (Oral)   Resp 18   SpO2 96% Comment: RA  Physical Exam Vitals and nursing note reviewed.  Constitutional:      General: He is not in acute distress.    Appearance: He is well-developed.     Comments: Elderly male laying in bed in no acute discomfort.  HENT:     Head: Atraumatic.  Eyes:     Conjunctiva/sclera: Conjunctivae normal.  Cardiovascular:     Rate and Rhythm: Normal rate and regular rhythm.     Pulses:  Normal pulses.     Heart sounds: Normal heart sounds.  Pulmonary:     Effort: Pulmonary effort is normal.     Breath sounds: Normal breath sounds.  Musculoskeletal:        General: Swelling (Right lower extremity: There is 1+ pitting edema localized to the dorsum of foot and ankle without any significant tenderness to palpation.  Dorsalis pedis pulse palpable with brisk cap refill.  No obvious deformity.  Leg compartment is soft ) present.     Cervical back: Neck supple.  Skin:    Capillary Refill: Capillary refill takes less than 2 seconds.     Findings: No rash.  Neurological:     Mental Status: He is alert.     Comments: Patient is oriented to self only, unsure situation,  location, or date.  Psychiatric:        Mood and Affect: Mood normal.     ED Results / Procedures / Treatments   Labs (all labs ordered are listed, but only abnormal results are displayed) Labs Reviewed  I-STAT CHEM 8, ED - Abnormal; Notable for the following components:      Result Value   BUN 38 (*)    Creatinine, Ser 2.90 (*)    Glucose, Bld 141 (*)    All other components within normal limits  CBC WITH DIFFERENTIAL/PLATELET    EKG None  Radiology DG Foot Complete Right  Result Date: 12/25/2019 CLINICAL DATA:  In early January pt was evaluated for right ankle swelling. A DVT was found and subsequently remove. Pt was admitted for a week and then discharged to a rehab facility for the past month. Pt was recently discharged home 3 or 4 days ago. Since being home, pt's ankle swelling has become worse. Pt was pitting edema and in unable to bear weight on ankle. Pt reports pain when attempting to bear weight on right ankle, knee, and hip. Pt denies pain when foot is elevated. EXAM: RIGHT FOOT COMPLETE - 3+ VIEW COMPARISON:  None. FINDINGS: No fracture or dislocation. There is subchondral bone resorption with associated sclerosis most evident at the second tarsal metatarsal articulation, seen to a milder degree  at the first and third tarsometatarsal articulations. Prominent dorsal spurring is noted from the medial tarsal metatarsal articulation on the lateral view. Remaining joints normally spaced and aligned. Small plantar calcaneal spur. Diffuse soft tissue swelling most evident dorsally. IMPRESSION: 1. No fracture or dislocation. 2. Midfoot arthropathic changes. The appearance suggest neuropathic osteoarthropathy. Electronically Signed   By: Lajean Manes M.D.   On: 12/25/2019 14:25    Procedures Procedures (including critical care time)  Medications Ordered in ED Medications - No data to display  ED Course  I have reviewed the triage vital signs and the nursing notes.  Pertinent labs & imaging results that were available during my care of the patient were reviewed by me and considered in my medical decision making (see chart for details).    MDM Rules/Calculators/A&P                      BP 107/66 (BP Location: Left Arm)   Pulse 67   Temp 98.1 F (36.7 C) (Oral)   Resp 18   SpO2 96%   Final Clinical Impression(s) / ED Diagnoses Final diagnoses:  Right leg swelling    Rx / DC Orders ED Discharge Orders    None     2:06 PM Patient recently diagnosed with right lower extremity DVT currently on Eliquis.  Family brought patient here due to continued pain and swelling to his right lower extremity.  No recent injury were noted however will obtain x-ray of the right foot for further assessment.  3:03 PM Labs are reassuring.  XRay of R foot without acute finding.  Will d/c home with TED hose compression to help with swelling.  Reassurance given.  outpt f/u recommended.    Domenic Moras, PA-C 12/25/19 1508    Blanchie Dessert, MD 12/27/19 1705

## 2019-12-25 NOTE — ED Notes (Signed)
[facility] called for status update of pt transport. This nurse was informed that pt is next on list

## 2020-01-16 DIAGNOSIS — E860 Dehydration: Secondary | ICD-10-CM | POA: Diagnosis not present

## 2020-01-17 DIAGNOSIS — E860 Dehydration: Secondary | ICD-10-CM | POA: Diagnosis not present

## 2020-01-18 DIAGNOSIS — E860 Dehydration: Secondary | ICD-10-CM | POA: Diagnosis not present

## 2020-01-19 DIAGNOSIS — E860 Dehydration: Secondary | ICD-10-CM | POA: Diagnosis not present

## 2020-01-20 DIAGNOSIS — E860 Dehydration: Secondary | ICD-10-CM | POA: Diagnosis not present

## 2020-01-21 ENCOUNTER — Telehealth (HOSPITAL_COMMUNITY): Payer: Self-pay

## 2020-01-21 ENCOUNTER — Other Ambulatory Visit (HOSPITAL_COMMUNITY): Payer: Self-pay | Admitting: Internal Medicine

## 2020-01-21 DIAGNOSIS — E860 Dehydration: Secondary | ICD-10-CM | POA: Diagnosis not present

## 2020-01-21 DIAGNOSIS — N289 Disorder of kidney and ureter, unspecified: Secondary | ICD-10-CM

## 2020-01-21 NOTE — Telephone Encounter (Signed)
Called to schedule picc placement, no answer, vm full. AW

## 2020-01-22 ENCOUNTER — Telehealth (HOSPITAL_COMMUNITY): Payer: Self-pay

## 2020-01-22 DIAGNOSIS — E860 Dehydration: Secondary | ICD-10-CM | POA: Diagnosis not present

## 2020-01-22 NOTE — Telephone Encounter (Signed)
Called to schedule picc placement, no answer, left vm. AW 

## 2020-01-26 ENCOUNTER — Other Ambulatory Visit: Payer: Self-pay | Admitting: Adult Health

## 2020-01-27 ENCOUNTER — Ambulatory Visit (HOSPITAL_COMMUNITY)
Admission: RE | Admit: 2020-01-27 | Discharge: 2020-01-27 | Disposition: A | Discharge status: Home / Self Care | Payer: Medicare PPO | Source: Ambulatory Visit | Attending: Internal Medicine | Admitting: Internal Medicine

## 2020-01-27 ENCOUNTER — Other Ambulatory Visit (HOSPITAL_COMMUNITY): Payer: Self-pay | Admitting: Internal Medicine

## 2020-01-27 ENCOUNTER — Other Ambulatory Visit: Payer: Self-pay

## 2020-01-27 DIAGNOSIS — Z452 Encounter for adjustment and management of vascular access device: Secondary | ICD-10-CM | POA: Diagnosis not present

## 2020-01-27 DIAGNOSIS — N289 Disorder of kidney and ureter, unspecified: Secondary | ICD-10-CM | POA: Diagnosis not present

## 2020-01-27 DIAGNOSIS — N19 Unspecified kidney failure: Secondary | ICD-10-CM | POA: Diagnosis not present

## 2020-01-27 MED ORDER — HEPARIN SOD (PORK) LOCK FLUSH 100 UNIT/ML IV SOLN
INTRAVENOUS | Status: AC
  Filled 2020-01-27: qty 5

## 2020-01-27 MED ORDER — LIDOCAINE HCL 1 % IJ SOLN
INTRAMUSCULAR | Status: AC
  Filled 2020-01-27: qty 20

## 2020-01-27 MED ORDER — LIDOCAINE HCL (PF) 1 % IJ SOLN
INTRAMUSCULAR | Status: DC | PRN
  Administered 2020-01-27: 10 mL

## 2020-01-27 NOTE — Procedures (Signed)
  Procedure: R arm PICC SL   40cm EBL:   minimal Complications:  none immediate  See full dictation in [facility].  Dillard Cannon MD Main # [phone] Pager  [phone]

## 2020-01-29 ENCOUNTER — Other Ambulatory Visit: Payer: Self-pay | Admitting: Adult Health

## 2020-02-03 DIAGNOSIS — E860 Dehydration: Secondary | ICD-10-CM | POA: Diagnosis not present

## 2020-02-04 DIAGNOSIS — E860 Dehydration: Secondary | ICD-10-CM | POA: Diagnosis not present

## 2020-02-05 DIAGNOSIS — E860 Dehydration: Secondary | ICD-10-CM | POA: Diagnosis not present

## 2020-02-06 DIAGNOSIS — E860 Dehydration: Secondary | ICD-10-CM | POA: Diagnosis not present

## 2020-02-07 DIAGNOSIS — E860 Dehydration: Secondary | ICD-10-CM | POA: Diagnosis not present

## 2020-02-08 DIAGNOSIS — E860 Dehydration: Secondary | ICD-10-CM | POA: Diagnosis not present

## 2020-02-09 DIAGNOSIS — E860 Dehydration: Secondary | ICD-10-CM | POA: Diagnosis not present

## 2020-03-05 ENCOUNTER — Emergency Department (HOSPITAL_COMMUNITY): Payer: Medicare PPO

## 2020-03-05 ENCOUNTER — Inpatient Hospital Stay (HOSPITAL_COMMUNITY): Payer: Medicare PPO

## 2020-03-05 ENCOUNTER — Inpatient Hospital Stay (HOSPITAL_COMMUNITY)
Admission: EM | Admit: 2020-03-05 | Discharge: 2020-03-12 | DRG: 871 | Disposition: A | Discharge status: Home Health | Payer: Medicare PPO | Attending: Internal Medicine | Admitting: Internal Medicine

## 2020-03-05 DIAGNOSIS — Z20822 Contact with and (suspected) exposure to covid-19: Secondary | ICD-10-CM | POA: Diagnosis present

## 2020-03-05 DIAGNOSIS — F0280 Dementia in other diseases classified elsewhere without behavioral disturbance: Secondary | ICD-10-CM | POA: Diagnosis not present

## 2020-03-05 DIAGNOSIS — I129 Hypertensive chronic kidney disease with stage 1 through stage 4 chronic kidney disease, or unspecified chronic kidney disease: Secondary | ICD-10-CM | POA: Diagnosis present

## 2020-03-05 DIAGNOSIS — R7989 Other specified abnormal findings of blood chemistry: Secondary | ICD-10-CM | POA: Diagnosis not present

## 2020-03-05 DIAGNOSIS — R509 Fever, unspecified: Secondary | ICD-10-CM | POA: Diagnosis not present

## 2020-03-05 DIAGNOSIS — Z79899 Other long term (current) drug therapy: Secondary | ICD-10-CM | POA: Diagnosis not present

## 2020-03-05 DIAGNOSIS — K7200 Acute and subacute hepatic failure without coma: Secondary | ICD-10-CM | POA: Diagnosis not present

## 2020-03-05 DIAGNOSIS — R410 Disorientation, unspecified: Secondary | ICD-10-CM | POA: Diagnosis not present

## 2020-03-05 DIAGNOSIS — K219 Gastro-esophageal reflux disease without esophagitis: Secondary | ICD-10-CM | POA: Diagnosis present

## 2020-03-05 DIAGNOSIS — N400 Enlarged prostate without lower urinary tract symptoms: Secondary | ICD-10-CM | POA: Diagnosis present

## 2020-03-05 DIAGNOSIS — E860 Dehydration: Secondary | ICD-10-CM | POA: Diagnosis present

## 2020-03-05 DIAGNOSIS — R404 Transient alteration of awareness: Secondary | ICD-10-CM | POA: Diagnosis not present

## 2020-03-05 DIAGNOSIS — G4733 Obstructive sleep apnea (adult) (pediatric): Secondary | ICD-10-CM | POA: Diagnosis not present

## 2020-03-05 DIAGNOSIS — E1151 Type 2 diabetes mellitus with diabetic peripheral angiopathy without gangrene: Secondary | ICD-10-CM | POA: Diagnosis present

## 2020-03-05 DIAGNOSIS — G309 Alzheimer's disease, unspecified: Secondary | ICD-10-CM | POA: Diagnosis not present

## 2020-03-05 DIAGNOSIS — N390 Urinary tract infection, site not specified: Secondary | ICD-10-CM | POA: Diagnosis not present

## 2020-03-05 DIAGNOSIS — R6520 Severe sepsis without septic shock: Secondary | ICD-10-CM | POA: Diagnosis not present

## 2020-03-05 DIAGNOSIS — Z86718 Personal history of other venous thrombosis and embolism: Secondary | ICD-10-CM | POA: Diagnosis not present

## 2020-03-05 DIAGNOSIS — E1121 Type 2 diabetes mellitus with diabetic nephropathy: Secondary | ICD-10-CM | POA: Diagnosis not present

## 2020-03-05 DIAGNOSIS — K5900 Constipation, unspecified: Secondary | ICD-10-CM | POA: Diagnosis present

## 2020-03-05 DIAGNOSIS — A419 Sepsis, unspecified organism: Secondary | ICD-10-CM | POA: Diagnosis not present

## 2020-03-05 DIAGNOSIS — Z7984 Long term (current) use of oral hypoglycemic drugs: Secondary | ICD-10-CM | POA: Diagnosis not present

## 2020-03-05 DIAGNOSIS — R4182 Altered mental status, unspecified: Secondary | ICD-10-CM | POA: Diagnosis not present

## 2020-03-05 DIAGNOSIS — R7881 Bacteremia: Secondary | ICD-10-CM | POA: Diagnosis not present

## 2020-03-05 DIAGNOSIS — E872 Acidosis: Secondary | ICD-10-CM | POA: Diagnosis not present

## 2020-03-05 DIAGNOSIS — K760 Fatty (change of) liver, not elsewhere classified: Secondary | ICD-10-CM | POA: Diagnosis present

## 2020-03-05 DIAGNOSIS — N1832 Chronic kidney disease, stage 3b: Secondary | ICD-10-CM | POA: Diagnosis not present

## 2020-03-05 DIAGNOSIS — Z7901 Long term (current) use of anticoagulants: Secondary | ICD-10-CM

## 2020-03-05 DIAGNOSIS — Z8551 Personal history of malignant neoplasm of bladder: Secondary | ICD-10-CM | POA: Diagnosis not present

## 2020-03-05 DIAGNOSIS — E875 Hyperkalemia: Secondary | ICD-10-CM | POA: Diagnosis present

## 2020-03-05 DIAGNOSIS — R000 Tachycardia, unspecified: Secondary | ICD-10-CM | POA: Diagnosis not present

## 2020-03-05 DIAGNOSIS — R29818 Other symptoms and signs involving the nervous system: Secondary | ICD-10-CM | POA: Diagnosis not present

## 2020-03-05 DIAGNOSIS — R531 Weakness: Secondary | ICD-10-CM | POA: Diagnosis not present

## 2020-03-05 DIAGNOSIS — Z66 Do not resuscitate: Secondary | ICD-10-CM | POA: Diagnosis not present

## 2020-03-05 DIAGNOSIS — E1122 Type 2 diabetes mellitus with diabetic chronic kidney disease: Secondary | ICD-10-CM | POA: Diagnosis present

## 2020-03-05 DIAGNOSIS — M2550 Pain in unspecified joint: Secondary | ICD-10-CM | POA: Diagnosis not present

## 2020-03-05 DIAGNOSIS — K828 Other specified diseases of gallbladder: Secondary | ICD-10-CM | POA: Diagnosis not present

## 2020-03-05 DIAGNOSIS — Z7401 Bed confinement status: Secondary | ICD-10-CM | POA: Diagnosis not present

## 2020-03-05 DIAGNOSIS — G9341 Metabolic encephalopathy: Secondary | ICD-10-CM | POA: Diagnosis present

## 2020-03-05 DIAGNOSIS — E119 Type 2 diabetes mellitus without complications: Secondary | ICD-10-CM

## 2020-03-05 DIAGNOSIS — N200 Calculus of kidney: Secondary | ICD-10-CM | POA: Diagnosis not present

## 2020-03-05 DIAGNOSIS — A408 Other streptococcal sepsis: Secondary | ICD-10-CM | POA: Diagnosis not present

## 2020-03-05 DIAGNOSIS — R945 Abnormal results of liver function studies: Secondary | ICD-10-CM | POA: Diagnosis not present

## 2020-03-05 LAB — COMPREHENSIVE METABOLIC PANEL
ALT: 313 U/L — ABNORMAL HIGH (ref 0–44)
AST: 243 U/L — ABNORMAL HIGH (ref 15–41)
Albumin: 3.4 g/dL — ABNORMAL LOW (ref 3.5–5.0)
Alkaline Phosphatase: 157 U/L — ABNORMAL HIGH (ref 38–126)
Anion gap: 14 (ref 5–15)
BUN: 26 mg/dL — ABNORMAL HIGH (ref 8–23)
CO2: 23 mmol/L (ref 22–32)
Calcium: 9.3 mg/dL (ref 8.9–10.3)
Chloride: 105 mmol/L (ref 98–111)
Creatinine, Ser: 2.66 mg/dL — ABNORMAL HIGH (ref 0.61–1.24)
GFR calc Af Amer: 25 mL/min — ABNORMAL LOW (ref >60)
GFR calc non Af Amer: 22 mL/min — ABNORMAL LOW (ref >60)
Glucose, Bld: 167 mg/dL — ABNORMAL HIGH (ref 70–99)
Potassium: 4.1 mmol/L (ref 3.5–5.1)
Sodium: 142 mmol/L (ref 135–145)
Total Bilirubin: 3.9 mg/dL — ABNORMAL HIGH (ref 0.3–1.2)
Total Protein: 7.6 g/dL (ref 6.5–8.1)

## 2020-03-05 LAB — URINALYSIS, ROUTINE W REFLEX MICROSCOPIC
Bilirubin Urine: NEGATIVE
Glucose, UA: NEGATIVE mg/dL
Hgb urine dipstick: NEGATIVE
Ketones, ur: NEGATIVE mg/dL
Leukocytes,Ua: NEGATIVE
Nitrite: NEGATIVE
Protein, ur: NEGATIVE mg/dL
Specific Gravity, Urine: 1.011 (ref 1.005–1.030)
pH: 5 (ref 5.0–8.0)

## 2020-03-05 LAB — CBC WITH DIFFERENTIAL/PLATELET
Abs Immature Granulocytes: 0.03 10*3/uL (ref 0.00–0.07)
Basophils Absolute: 0 10*3/uL (ref 0.0–0.1)
Basophils Relative: 0 %
Eosinophils Absolute: 0 10*3/uL (ref 0.0–0.5)
Eosinophils Relative: 0 %
HCT: 47.3 % (ref 39.0–52.0)
Hemoglobin: 15.1 g/dL (ref 13.0–17.0)
Immature Granulocytes: 0 %
Lymphocytes Relative: 7 %
Lymphs Abs: 0.6 10*3/uL — ABNORMAL LOW (ref 0.7–4.0)
MCH: 28.7 pg (ref 26.0–34.0)
MCHC: 31.9 g/dL (ref 30.0–36.0)
MCV: 89.8 fL (ref 80.0–100.0)
Monocytes Absolute: 0.4 10*3/uL (ref 0.1–1.0)
Monocytes Relative: 5 %
Neutro Abs: 7.5 10*3/uL (ref 1.7–7.7)
Neutrophils Relative %: 88 %
Platelets: 176 10*3/uL (ref 150–400)
RBC: 5.27 MIL/uL (ref 4.22–5.81)
RDW: 14.6 % (ref 11.5–15.5)
WBC: 8.6 10*3/uL (ref 4.0–10.5)
nRBC: 0 % (ref 0.0–0.2)

## 2020-03-05 LAB — LACTIC ACID, PLASMA
Lactic Acid, Venous: 3.3 mmol/L (ref 0.5–1.9)
Lactic Acid, Venous: 4.2 mmol/L (ref 0.5–1.9)

## 2020-03-05 LAB — POC SARS CORONAVIRUS 2 AG -  ED: SARS Coronavirus 2 Ag: NEGATIVE

## 2020-03-05 LAB — AMMONIA: Ammonia: 38 umol/L — ABNORMAL HIGH (ref 9–35)

## 2020-03-05 LAB — PROTIME-INR
INR: 1.1 (ref 0.8–1.2)
Prothrombin Time: 14.2 seconds (ref 11.4–15.2)

## 2020-03-05 LAB — LIPASE, BLOOD: Lipase: 35 U/L (ref 11–51)

## 2020-03-05 MED ORDER — SODIUM CHLORIDE 0.9 % IV BOLUS (SEPSIS)
1000.0000 mL | Freq: Once | INTRAVENOUS | Status: AC
  Administered 2020-03-05: 1000 mL via INTRAVENOUS

## 2020-03-05 MED ORDER — VANCOMYCIN HCL IN DEXTROSE 1-5 GM/200ML-% IV SOLN: 1000.0000 mg | Freq: Once | INTRAVENOUS | Status: DC

## 2020-03-05 MED ORDER — VANCOMYCIN HCL 2000 MG/400ML IV SOLN
2000.0000 mg | Freq: Once | INTRAVENOUS | Status: AC
  Administered 2020-03-05: 20:00:00 2000 mg via INTRAVENOUS
  Filled 2020-03-05: qty 400

## 2020-03-05 MED ORDER — INSULIN ASPART 100 UNIT/ML ~~LOC~~ SOLN
0.0000 [IU] | Freq: Three times a day (TID) | SUBCUTANEOUS | Status: DC
  Administered 2020-03-07 – 2020-03-10 (×3): 1 [IU] via SUBCUTANEOUS

## 2020-03-05 MED ORDER — SODIUM CHLORIDE 0.9 % IV SOLN: INTRAVENOUS | Status: AC

## 2020-03-05 MED ORDER — ACETAMINOPHEN 325 MG PO TABS
650.0000 mg | ORAL_TABLET | Freq: Once | ORAL | Status: AC
  Administered 2020-03-05: 650 mg via ORAL
  Filled 2020-03-05: qty 2

## 2020-03-05 MED ORDER — METRONIDAZOLE IN NACL 5-0.79 MG/ML-% IV SOLN
500.0000 mg | Freq: Three times a day (TID) | INTRAVENOUS | Status: DC
  Administered 2020-03-06 – 2020-03-10 (×13): 500 mg via INTRAVENOUS
  Filled 2020-03-05 (×13): qty 100

## 2020-03-05 MED ORDER — ACETAMINOPHEN 650 MG RE SUPP: 650.0000 mg | Freq: Four times a day (QID) | RECTAL | Status: DC | PRN

## 2020-03-05 MED ORDER — SODIUM CHLORIDE 0.9 % IV SOLN
2.0000 g | Freq: Once | INTRAVENOUS | Status: AC
  Administered 2020-03-05: 2 g via INTRAVENOUS
  Filled 2020-03-05: qty 2

## 2020-03-05 MED ORDER — METRONIDAZOLE IN NACL 5-0.79 MG/ML-% IV SOLN
500.0000 mg | Freq: Once | INTRAVENOUS | Status: AC
  Administered 2020-03-05: 19:00:00 500 mg via INTRAVENOUS
  Filled 2020-03-05: qty 100

## 2020-03-05 MED ORDER — ACETAMINOPHEN 500 MG PO TABS: 1000.0000 mg | ORAL_TABLET | Freq: Once | ORAL | Status: DC

## 2020-03-05 MED ORDER — ASPIRIN EC 81 MG PO TBEC
81.0000 mg | DELAYED_RELEASE_TABLET | Freq: Every day | ORAL | Status: DC
  Administered 2020-03-06 – 2020-03-12 (×7): 81 mg via ORAL
  Filled 2020-03-05 (×7): qty 1

## 2020-03-05 MED ORDER — INSULIN ASPART 100 UNIT/ML ~~LOC~~ SOLN: 0.0000 [IU] | Freq: Every day | SUBCUTANEOUS | Status: DC

## 2020-03-05 MED ORDER — BISACODYL 10 MG RE SUPP: 10.0000 mg | Freq: Once | RECTAL | Status: DC

## 2020-03-05 MED ORDER — APIXABAN 2.5 MG PO TABS
2.5000 mg | ORAL_TABLET | Freq: Two times a day (BID) | ORAL | Status: DC
  Administered 2020-03-06 – 2020-03-07 (×4): 2.5 mg via ORAL
  Filled 2020-03-05 (×5): qty 1

## 2020-03-05 MED ORDER — VANCOMYCIN HCL 1500 MG/300ML IV SOLN
1500.0000 mg | INTRAVENOUS | Status: DC
  Administered 2020-03-07: 1500 mg via INTRAVENOUS
  Filled 2020-03-05: qty 300

## 2020-03-05 MED ORDER — ACETAMINOPHEN 325 MG PO TABS: 650.0000 mg | ORAL_TABLET | Freq: Four times a day (QID) | ORAL | Status: DC | PRN

## 2020-03-05 MED ORDER — SODIUM CHLORIDE 0.9 % IV SOLN
2.0000 g | INTRAVENOUS | Status: DC
  Administered 2020-03-06 – 2020-03-09 (×4): 2 g via INTRAVENOUS
  Filled 2020-03-05 (×4): qty 2

## 2020-03-05 NOTE — ED Provider Notes (Signed)
Signout note  79 year old male with diabetes, hyperlipidemia, DVT on eliquis, vascular dementia presents to ER with decreased energy, increased confusion.  Febrile, concern for possible infection.  Broad work-up initiated, labs concerning for mild elevation in LFTs, elevated T bili.  Lipase sent, ultrasound right upper quadrant ordered to further assess.  Awaiting UA.  3:30 PM Received signout from Dr. Sherry Ruffing  4:29 PM Recheck patient, no change in clinical status, alert, answering most questions appropriately, BP stable, borderline tachy, heading to [place] now  6:00 PM Reviewed [place] report, repeat lactic is uptrending, will check CT, will start broad spectrum abx, fluids, and reassess  8:00 PM CT abd/pelv results without acute abnormality; rechecked patient, no longer tachy, will admit to hospitalist for further observation  [facility] will accept        Lucrezia Starch, MD 03/05/20 2243

## 2020-03-05 NOTE — ED Triage Notes (Signed)
Patient arrived via EMS; from home. Reported patient has a home health assistance; concern for decreased energy and not talking as usual. EMS endorsed patient follows commands and no unilateral deficits. ;last well known 1900 03/04/2020; reported hx of alzheimer's and DVT; patient is on eliquis. EMS stated pt was febrile en route 101.4 temporal.

## 2020-03-05 NOTE — ED Provider Notes (Signed)
[facility] EMERGENCY DEPARTMENT Provider Note   CSN: [id number] Arrival date & time: 03/05/20  1245     History Chief Complaint  Patient presents with  . Altered Mental Status    Tony Fox is a 79 y.o. male.  The history is provided by the patient and medical records. The history is limited by the condition of the patient. No language interpreter was used.  Fever Max temp prior to arrival:  101 Temp source:  Oral Severity:  Moderate Onset quality:  Gradual Timing:  Constant Progression:  Unchanged Chronicity:  New Relieved by:  Nothing Worsened by:  Nothing Ineffective treatments:  None tried Associated symptoms: chills, confusion, dysuria and somnolence   Associated symptoms: no chest pain, no congestion, no cough, no diarrhea, no headaches, no myalgias, no nausea, no rash and no vomiting        Past Medical History:  Diagnosis Date  . Arthritis   . Borderline diabetes mellitus   . BPH (benign prostatic hypertrophy)   . DDD (degenerative disc disease), lumbar   . GERD (gastroesophageal reflux disease)   . History of bladder cancer   . Hyperlipidemia   . Hypertension   . Nocturia   . OSA on CPAP    PER STUDY  05/2007  MODERATE OSA  . Wears glasses     Patient Active Problem List   Diagnosis Date Noted  . Acute deep vein thrombosis (DVT) of right femoral vein ([place]) 12/04/2019  . OSA (obstructive sleep apnea) 12/04/2019  . Dyslipidemia associated with type 2 diabetes mellitus ([place]) 12/04/2019  . Vitamin D deficiency 12/04/2019  . Vascular dementia without behavioral disturbance ([place]) 12/04/2019  . Physical deconditioning 12/04/2019  . Elevated troponin level not due to acute coronary syndrome 11/24/2019  . AKI (acute kidney injury) ([place]) 11/24/2019  . Elevated d-dimer 11/24/2019  . Lactic acidosis 11/24/2019  . Generalized weakness 11/24/2019  . Thrombocytopenia ([place]) 11/24/2019  . Memory loss 07/16/2015  . History of bladder  cancer 07/16/2015  . Hypertension associated with type 2 diabetes mellitus ([place]) 07/16/2015  . Hyperlipidemia 07/16/2015  . Uncomplicated type 2 diabetes mellitus ([place]) 07/16/2015    Past Surgical History:  Procedure Laterality Date  . CYSTOSCOPY W/ RETROGRADES Bilateral 12/30/2014   Procedure: CYSTOSCOPY WITH RETROGRADE PYELOGRAM;  Surgeon: Arvil Persons, MD;  Location: [facility];  Service: Urology;  Laterality: Bilateral;  . CYSTOSCOPY WITH BIOPSY N/A 03/07/2014   Procedure: CYSTOSCOPY WITH Bladder Washings and Bladder BIOPSY;  Surgeon: Lowella Bandy, MD;  [facility];  Service: Urology;  Laterality: N/A;  . CYSTOSCOPY WITH BIOPSY N/A 12/30/2014   Procedure: CYSTOSCOPY WITH BIOPSY;  Surgeon: Arvil Persons, MD;  Location: [facility];  Service: Urology;  Laterality: N/A;  . TRANSURETHRAL RESECTION OF BLADDER TUMOR  12-30-2008//   11-18-2008//   10-08-2008  . TRANSURETHRAL RESECTION OF PROSTATE N/A 12/30/2014   Procedure: TRANSURETHRAL RESECTION PROSTATE BIOPSY;  Surgeon: Arvil Persons, MD;  Location: [facility];  Service: Urology;  Laterality: N/A;       Family History  Problem Relation Age of Onset  . Alzheimer's disease Mother   . Stroke Father     Social History   Tobacco Use  . Smoking status: Never Smoker  . Smokeless tobacco: Never Used  Substance Use Topics  . Alcohol use: No    Alcohol/week: 0.0 standard drinks  . Drug use: No    Home Medications Prior to Admission medications  Medication Sig Start Date End Date Taking? Authorizing Provider  apixaban (ELIQUIS) 5 MG TABS tablet Take 1 tablet (5 mg total) by mouth 2 (two) times daily. 12/19/19   Gerlene Fee, NP  ascorbic acid (VITAMIN C) 500 MG tablet Take 500 mg by mouth daily.    [provider]  atorvastatin (LIPITOR) 20 MG tablet Take 1 tablet (20 mg total) by mouth at bedtime. 12/19/19   Gerlene Fee, NP  donepezil (ARICEPT) 5 MG tablet  Take 1 tablet (5 mg total) by mouth 2 (two) times daily. 12/19/19   Gerlene Fee, NP  glipiZIDE-metformin (METAGLIP) 2.5-500 MG tablet Take 1 tablet by mouth daily. 12/19/19   Gerlene Fee, NP  hydrALAZINE (APRESOLINE) 25 MG tablet Take 1 tablet (25 mg total) by mouth 3 (three) times daily. 12/19/19   Gerlene Fee, NP  NON FORMULARY Diet - Liquids: _X__Regular; ___Thickened ___ Consistency: ___ Nectar, ___Honey, ___ Pudding; ____ Fluid Restriction General Diet: NAS CON CHO    [provider]  QUEtiapine (SEROQUEL) 25 MG tablet Take 0.5 tablets (12.5 mg total) by mouth at bedtime. 12/19/19   Gerlene Fee, NP  Vitamin D, Ergocalciferol, (DRISDOL) 1.25 MG (50000 UNIT) CAPS capsule Take 1 capsule (50,000 Units total) by mouth once a week. On Monday 12/19/19   Gerlene Fee, NP    Allergies    Patient has no known allergies.  Review of Systems   Review of Systems  Unable to perform ROS: Dementia  Constitutional: Positive for chills, fatigue and fever. Negative for diaphoresis.  HENT: Negative for congestion.   Eyes: Negative for visual disturbance.  Respiratory: Negative for cough, chest tightness, shortness of breath and wheezing.   Cardiovascular: Negative for chest pain.  Gastrointestinal: Negative for abdominal pain, constipation, diarrhea, nausea and vomiting.  Genitourinary: Positive for dysuria. Negative for flank pain and frequency.  Musculoskeletal: Negative for back pain, myalgias, neck pain and neck stiffness.  Skin: Negative for rash and wound.  Neurological: Positive for light-headedness. Negative for dizziness, weakness and headaches.  Psychiatric/Behavioral: Positive for confusion. Negative for agitation.  All other systems reviewed and are negative.   Physical Exam Updated Vital Signs BP 123/83 (BP Location: Left Arm)   Pulse (!) 114   Temp (!) 101.8 F (38.8 C) (Oral)   Resp 17   SpO2 95%   Physical Exam Vitals and nursing note reviewed.    Constitutional:      General: He is not in acute distress.    Appearance: He is well-developed. He is not ill-appearing, toxic-appearing or diaphoretic.  HENT:     Head: Normocephalic and atraumatic.     Nose: Nose normal. No congestion or rhinorrhea.     Mouth/Throat:     Mouth: Mucous membranes are moist.     Pharynx: Oropharynx is clear. No oropharyngeal exudate or posterior oropharyngeal erythema.  Eyes:     Extraocular Movements: Extraocular movements intact.     Conjunctiva/sclera: Conjunctivae normal.     Pupils: Pupils are equal, round, and reactive to light.  Cardiovascular:     Rate and Rhythm: Regular rhythm. Tachycardia present.     Pulses: Normal pulses.     Heart sounds: No murmur.  Pulmonary:     Effort: Pulmonary effort is normal. No respiratory distress.     Breath sounds: Rhonchi present. No wheezing or rales.  Abdominal:     Palpations: Abdomen is soft.     Tenderness: There is abdominal tenderness. There is no right  CVA tenderness, left CVA tenderness, guarding or rebound.  Musculoskeletal:        General: No tenderness.     Cervical back: Neck supple.     Right lower leg: No edema.     Left lower leg: No edema.  Skin:    General: Skin is warm and dry.     Capillary Refill: Capillary refill takes less than 2 seconds.     Findings: No erythema or rash.  Neurological:     Mental Status: He is alert. He is disoriented.     Sensory: No sensory deficit.     Motor: No weakness.     ED Results / Procedures / Treatments   Labs (all labs ordered are listed, but only abnormal results are displayed) Labs Reviewed  COMPREHENSIVE METABOLIC PANEL - Abnormal; Notable for the following components:      Result Value   Glucose, Bld 167 (*)    BUN 26 (*)    Creatinine, Ser 2.66 (*)    Albumin 3.4 (*)    AST 243 (*)    ALT 313 (*)    Alkaline Phosphatase 157 (*)    Total Bilirubin 3.9 (*)    GFR calc non Af Amer 22 (*)    GFR calc Af Amer 25 (*)    All other  components within normal limits  LACTIC ACID, PLASMA - Abnormal; Notable for the following components:   Lactic Acid, Venous 3.3 (*)    All other components within normal limits  CBC WITH DIFFERENTIAL/PLATELET - Abnormal; Notable for the following components:   Lymphs Abs 0.6 (*)    All other components within normal limits  CULTURE, BLOOD (ROUTINE X 2)  CULTURE, BLOOD (ROUTINE X 2)  URINE CULTURE  PROTIME-INR  LACTIC ACID, PLASMA  URINALYSIS, ROUTINE W REFLEX MICROSCOPIC  LIPASE, BLOOD  AMMONIA  POC SARS CORONAVIRUS 2 AG -  ED    EKG None  Radiology DG Chest 2 View  Result Date: 03/05/2020 CLINICAL DATA:  79 year old male with fever of 101.4, suspected sepsis. EXAM: CHEST - 2 VIEW COMPARISON:  Portable chest 11/25/2019 and earlier. FINDINGS: Upright AP and lateral views of the chest. Low lung volumes. Mediastinal contours remain within normal limits. Visualized tracheal air column is within normal limits. No pneumothorax or pulmonary edema. No pleural effusion or confluent pulmonary opacity. No acute osseous abnormality identified. Visible bowel-gas pattern within normal limits. IMPRESSION: Low lung volumes, otherwise no acute cardiopulmonary abnormality. Electronically Signed   By: Genevie Ann M.D.   On: 03/05/2020 13:45    Procedures Procedures (including critical care time)  Medications Ordered in ED Medications  acetaminophen (TYLENOL) tablet 650 mg (has no administration in time range)    ED Course  I have reviewed the triage vital signs and the nursing notes.  Pertinent labs & imaging results that were available during my care of the patient were reviewed by me and considered in my medical decision making (see chart for details).    MDM Rules/Calculators/A&P                      MORSE BRUEGGEMANN is a 79 y.o. male with a past medical history significant for bladder cancer, diabetes, hyperlipidemia, prior DVT on Eliquis, and sleep apnea who presents for fatigue, altered  mental status, reported dysuria, and fever.  According to EMS, patient was difficult to arouse this morning and not acting normal.  They found him to have a fever and patient was  complaining of some urinary symptoms.  They brought him to evaluation to the emergency department and was found to have fever and tachycardia.  Patient reports no cough, congestion, chest pain, or shortness of breath.  He does report dysuria but denies diarrhea or constipation.  He denies any headache, neck pain, or neck stiffness.  He does not feel he is having any neurologic problems with denying any numbness, tingling, weakness, or speech difficulties.  He denies any vision changes.  He reported the urinary symptoms but denied abdominal pain.  On exam, lungs are slightly coarse and chest is nontender.  Abdomen is slightly tender on exam but mild.  He denied pain but did slightly grimace on exam.  Patient moving all extremities.  Patient is disoriented.  Clinically I am concerned about developing sepsis for this patient.  We will get a Covid test as well as urinalysis with urinary symptoms.  Labs began to return showing LFT elevation, will get a right upper quadrant ultrasound.  When his work-up is completed, anticipate admission for somnolence, altered mental status, and likely UTI versus other source of infection.  Based on his lack of headache, neck pain, or neck stiffness, low suspicion for meningitis or encephalitis at this time.  Care transferred to oncoming team while awaiting results of work-up and disposition determination.  Final Clinical Impression(s) / ED Diagnoses Final diagnoses:  LFT elevation    Clinical Impression: 1. LFT elevation     Disposition: Care transferred to Dr. Roslynn Amble for awaiting results for right upper quadrant ultrasound and likely admission for sepsis.  This note was prepared with assistance of Systems analyst. Occasional wrong-word or sound-a-like substitutions may  have occurred due to the inherent limitations of voice recognition software.     Rekita Miotke, Gwenyth Allegra, MD 03/05/20 1650

## 2020-03-05 NOTE — ED Notes (Signed)
Pt transported to CT ?

## 2020-03-05 NOTE — ED Notes (Signed)
Taken to CT.

## 2020-03-05 NOTE — Progress Notes (Signed)
Pharmacy Antibiotic Note  Tony Fox is a 79 y.o. male admitted on 03/05/2020 with sepsis.  Pharmacy has been consulted for Vancomycin and Cefepime dosing. Patient febrile with Tm 101.8, WBC wnl, Lactate elevated 4.2, Scr 2.66.  Plan: Vancomycin 2000mg  IV once, then 1500mg  IV Q48 hrs (Est AUC 464.7, Scr 2.66, Vd 0.72) Cefepime 2g IV Q24 hrs Monitor renal function, cultures/sensitivities, clinical progression  Height: 6' (182.9 cm) Weight: 91.2 kg (201 lb) IBW/kg (Calculated) : 77.6  Temp (24hrs), Avg:101.8 F (38.8 C), Min:101.8 F (38.8 C), Max:101.8 F (38.8 C)  Recent Labs  Lab 03/05/20 1340 03/05/20 1604  WBC 8.6  --   CREATININE 2.66*  --   LATICACIDVEN 3.3* 4.2*    Estimated Creatinine Clearance: 24.3 mL/min (A) (by C-G formula based on SCr of 2.66 mg/dL (H)).    No Known Allergies  Antimicrobials this admission: Vancomycin 4/22 >>  Cefepime 4/22 >>  Flagyl 4/22 x1  Dose adjustments this admission: N/A  Microbiology results: 4/22 BCx:  4/22 UCx:    Richardine Service, PharmD PGY1 Pharmacy Resident Phone: [phone] 03/05/2020  6:43 PM  Please check [email or web address] for unit-specific pharmacy phone numbers.

## 2020-03-05 NOTE — H&P (Addendum)
History and Physical    Tony Fox:[id number] DOB: May 09, 1941 DOA: 03/05/2020  PCP: Willey Blade, MD Patient coming from: Home  Chief Complaint: Generalized weakness, fever, altered mental status  HPI: Tony Fox is a 79 y.o. male with medical history significant of Alzheimer's dementia, BPH, GERD, hypertension, hyperlipidemia, OSA on CPAP, noninsulin-dependent type 2 diabetes, CKD stage III, right lower extremity DVT diagnosed in January 2021 presenting to the ED via EMS for evaluation of generalized weakness, fever, and altered mental status.  According to EMS, patient was difficult to arouse this morning and not acting normal.  They found him to be febrile and patient was complaining of some urinary symptoms.  Patient is oriented to self only.  He is not sure why he is here.  He has no complaints.  Denies fevers, chills, headache, neck pain/stiffness, cough, shortness of breath, chest pain, nausea, vomiting, abdominal pain, diarrhea, dysuria, or urinary frequency/urgency.  No additional history could be obtained from him.  ED Course: Febrile with temperature 101.8 F.  Initially tachycardic and tachypneic, now resolved.  Not hypotensive.  Not hypoxic.  Labs showing no leukocytosis.  Lactic acid 3.3 >4.2.  Blood glucose 167.  BUN 26, creatinine 2.6.  Creatinine ranging between 1.9-2.9 a few months ago.  AST 243, ALT 313, alk phos 157, and T bili 3.9. Lipase normal.  No significant elevation of ammonia.  Blood culture x2 pending.  SARS-CoV-2 rapid antigen test negative, PCR test pending.  UA not suggestive of infection.  Urine culture pending.  Chest x-ray not suggestive of pneumonia.  Right upper quadrant ultrasound IMPRESSION: 1. Biliary sludge, without cholelithiasis or cholecystitis. 2. Limited evaluation due to bowel gas and patient cooperation. 3. Hepatic steatosis.  CT abdomen pelvis results are not crossing over in epic.  No acute findings per verbal report from  radiology.  Showing nephrolithiasis without evidence of hydronephrosis.  Rectum significantly distended with stool.  Head CT negative for acute finding per verbal report from radiology.  Results are not crossing over in epic.  Patient received Tylenol, vancomycin, cefepime, metronidazole, and 1 L normal saline bolus.  Review of Systems:  All systems reviewed and apart from history of presenting illness, are negative.  Past Medical History:  Diagnosis Date  . Arthritis   . Borderline diabetes mellitus   . BPH (benign prostatic hypertrophy)   . DDD (degenerative disc disease), lumbar   . GERD (gastroesophageal reflux disease)   . History of bladder cancer   . Hyperlipidemia   . Hypertension   . Nocturia   . OSA on CPAP    PER STUDY  05/2007  MODERATE OSA  . Wears glasses     Past Surgical History:  Procedure Laterality Date  . CYSTOSCOPY W/ RETROGRADES Bilateral 12/30/2014   Procedure: CYSTOSCOPY WITH RETROGRADE PYELOGRAM;  Surgeon: Arvil Persons, MD;  Location: [facility];  Service: Urology;  Laterality: Bilateral;  . CYSTOSCOPY WITH BIOPSY N/A 03/07/2014   Procedure: CYSTOSCOPY WITH Bladder Washings and Bladder BIOPSY;  Surgeon: Lowella Bandy, MD;  Location: [facility];  Service: Urology;  Laterality: N/A;  . CYSTOSCOPY WITH BIOPSY N/A 12/30/2014   Procedure: CYSTOSCOPY WITH BIOPSY;  Surgeon: Arvil Persons, MD;  Location: [facility];  Service: Urology;  Laterality: N/A;  . TRANSURETHRAL RESECTION OF BLADDER TUMOR  12-30-2008//   11-18-2008//   10-08-2008  . TRANSURETHRAL RESECTION OF PROSTATE N/A 12/30/2014   Procedure: TRANSURETHRAL RESECTION PROSTATE BIOPSY;  Surgeon: Arvil Persons, MD;  Location: [place];  Service: Urology;  Laterality: N/A;     reports that he has never smoked. He has never used smokeless tobacco. He reports that he does not drink alcohol or use drugs.  No Known Allergies  Family History  Problem  Relation Age of Onset  . Alzheimer's disease Mother   . Stroke Father     Prior to Admission medications   Medication Sig Start Date End Date Taking? Authorizing Provider  acetaminophen (TYLENOL) 500 MG tablet Take 1,000 mg by mouth every 6 (six) hours as needed (for pain).   Yes [provider]  apixaban (ELIQUIS) 2.5 MG TABS tablet Take 2.5 mg by mouth 2 (two) times daily.   Yes [provider]  ascorbic acid (VITAMIN C) 500 MG tablet Take 500 mg by mouth 2 (two) times a week.    Yes [provider]  aspirin EC 81 MG tablet Take 81 mg by mouth daily.   Yes [provider]  Cholecalciferol (VITAMIN D-3 PO) Take 1 capsule by mouth See admin instructions. Take 1 capsule by mouth 2 times a week- on Mondays and Fridays   Yes [provider]  donepezil (ARICEPT) 10 MG tablet Take 10 mg by mouth in the morning.   Yes [provider]  furosemide (LASIX) 20 MG tablet Take 20 mg by mouth in the morning.   Yes [provider]  glipizide-metformin (METAGLIP) 2.5-250 MG tablet Take 1 tablet by mouth daily.   Yes [provider]  liver oil-zinc oxide (DESITIN) 40 % ointment Apply 1 application topically as needed for irritation (to affected sites).   Yes [provider]  apixaban (ELIQUIS) 5 MG TABS tablet Take 1 tablet (5 mg total) by mouth 2 (two) times daily. Patient not taking: Reported on 03/05/2020 12/19/19   Gerlene Fee, NP  atorvastatin (LIPITOR) 20 MG tablet Take 1 tablet (20 mg total) by mouth at bedtime. Patient not taking: Reported on 03/05/2020 12/19/19   Gerlene Fee, NP  donepezil (ARICEPT) 5 MG tablet Take 1 tablet (5 mg total) by mouth 2 (two) times daily. Patient not taking: Reported on 03/05/2020 12/19/19   Gerlene Fee, NP  glipiZIDE-metformin (METAGLIP) 2.5-500 MG tablet Take 1 tablet by mouth daily. Patient not taking: Reported on 03/05/2020 12/19/19   Gerlene Fee, NP  hydrALAZINE (APRESOLINE) 25  MG tablet Take 1 tablet (25 mg total) by mouth 3 (three) times daily. 12/19/19   Gerlene Fee, NP  NON FORMULARY Diet - Liquids: _X__Regular; ___Thickened ___ Consistency: ___ Nectar, ___Honey, ___ Pudding; ____ Fluid Restriction General Diet: NAS CON CHO    [provider]  olmesartan-hydrochlorothiazide (BENICAR HCT) 20-12.5 MG tablet Take 1 tablet by mouth daily.    [provider]  QUEtiapine (SEROQUEL) 25 MG tablet Take 0.5 tablets (12.5 mg total) by mouth at bedtime. Patient not taking: Reported on 03/05/2020 12/19/19   Gerlene Fee, NP  Vitamin D, Ergocalciferol, (DRISDOL) 1.25 MG (50000 UNIT) CAPS capsule Take 1 capsule (50,000 Units total) by mouth once a week. On Monday Patient not taking: Reported on 03/05/2020 12/19/19   Gerlene Fee, NP    Physical Exam: Vitals:   03/05/20 2100 03/05/20 2115 03/05/20 2130 03/05/20 2206  BP: 109/61 111/75 118/71 117/68  Pulse: 81 74 76 75  Resp: _0 Temp:      TempSrc:      SpO2: 98% 95% 96% 98%  Weight:  Height:        Physical Exam  Constitutional: He appears well-developed and well-nourished. No distress.  HENT:  Head: Normocephalic.  Eyes: Pupils are equal, round, and reactive to light. EOM are normal. Right eye exhibits no discharge. Left eye exhibits no discharge.  Cardiovascular: Normal rate, regular rhythm and intact distal pulses.  Pulmonary/Chest: Effort normal and breath sounds normal. No respiratory distress. He has no wheezes. He has no rales.  Abdominal: Soft. Bowel sounds are normal. He exhibits no distension. There is no abdominal tenderness. There is no guarding.  Musculoskeletal:        General: No edema.     Cervical back: Neck supple.  Neurological:  Awake and alert Oriented to self only Moving all extremities spontaneously.  No focal motor deficit.  Skin: Skin is warm and dry. He is not diaphoretic.     Labs on Admission: I have personally reviewed following labs and imaging  studies  CBC: Recent Labs  Lab 03/05/20 1340  WBC 8.6  NEUTROABS 7.5  HGB 15.1  HCT 47.3  MCV 89.8  PLT 427   Basic Metabolic Panel: Recent Labs  Lab 03/05/20 1340  NA 142  K 4.1  CL 105  CO2 23  GLUCOSE 167*  BUN 26*  CREATININE 2.66*  CALCIUM 9.3   GFR: Estimated Creatinine Clearance: 24.3 mL/min (A) (by C-G formula based on SCr of 2.66 mg/dL (H)). Liver Function Tests: Recent Labs  Lab 03/05/20 1340  AST 243*  ALT 313*  ALKPHOS 157*  BILITOT 3.9*  PROT 7.6  ALBUMIN 3.4*   Recent Labs  Lab 03/05/20 1604  LIPASE 35   Recent Labs  Lab 03/05/20 1604  AMMONIA 38*   Coagulation Profile: Recent Labs  Lab 03/05/20 1340  INR 1.1   Cardiac Enzymes: No results for input(s): CKTOTAL, CKMB, CKMBINDEX, TROPONINI in the last 168 hours. BNP (last 3 results) No results for input(s): PROBNP in the last 8760 hours. HbA1C: No results for input(s): HGBA1C in the last 72 hours. CBG: No results for input(s): GLUCAP in the last 168 hours. Lipid Profile: No results for input(s): CHOL, HDL, LDLCALC, TRIG, CHOLHDL, LDLDIRECT in the last 72 hours. Thyroid Function Tests: No results for input(s): TSH, T4TOTAL, FREET4, T3FREE, THYROIDAB in the last 72 hours. Anemia Panel: No results for input(s): VITAMINB12, FOLATE, FERRITIN, TIBC, IRON, RETICCTPCT in the last 72 hours. Urine analysis:    Component Value Date/Time   COLORURINE YELLOW 03/05/2020 1830   APPEARANCEUR CLEAR 03/05/2020 1830   LABSPEC 1.011 03/05/2020 1830   PHURINE 5.0 03/05/2020 1830   GLUCOSEU NEGATIVE 03/05/2020 1830   HGBUR NEGATIVE 03/05/2020 1830   BILIRUBINUR NEGATIVE 03/05/2020 1830   KETONESUR NEGATIVE 03/05/2020 1830   PROTEINUR NEGATIVE 03/05/2020 1830   UROBILINOGEN 0.2 09/23/2008 0237   NITRITE NEGATIVE 03/05/2020 1830   LEUKOCYTESUR NEGATIVE 03/05/2020 1830    Radiological Exams on Admission: DG Chest 2 View  Result Date: 03/05/2020 CLINICAL DATA:  79 year old male with fever of  101.4, suspected sepsis. EXAM: CHEST - 2 VIEW COMPARISON:  Portable chest 11/25/2019 and earlier. FINDINGS: Upright AP and lateral views of the chest. Low lung volumes. Mediastinal contours remain within normal limits. Visualized tracheal air column is within normal limits. No pneumothorax or pulmonary edema. No pleural effusion or confluent pulmonary opacity. No acute osseous abnormality identified. Visible bowel-gas pattern within normal limits. IMPRESSION: Low lung volumes, otherwise no acute cardiopulmonary abnormality. Electronically Signed   By: Genevie Ann M.D.   On: 03/05/2020 13:45  EKG: Independently reviewed.  Sinus rhythm, QTC 563.  QT interval increased compared to prior tracing.  Assessment/Plan Principal Problem:   Sepsis ([place]) Active Problems:   OSA (obstructive sleep apnea)   Acute metabolic encephalopathy   Elevated LFTs   Type 2 diabetes mellitus ([place])   Sepsis from unknown source: Febrile, tachycardic, and tachypneic on arrival, now resolved.  Not hypotensive.  Labs showing no leukocytosis.  Lactic acid 3.3 >4.2.  Chest x-ray not suggestive of pneumonia.  UA not suggestive of infection.  CT abdomen pelvis negative for infectious source.  Meningitis less likely given no headache/meningeal signs.  SARS-CoV-2 rapid antigen test negative. -Plan is to continue broad-spectrum antibiotics including vancomycin, cefepime, and metronidazole at this time.  Continue IV fluid.  Tylenol as needed for fevers.  Urine culture pending.  Blood culture x2 pending.  Check procalcitonin level.  Trend lactate.  SARS-CoV-2 PCR test pending.  Acute metabolic encephalopathy: Suspect related to sepsis although no underlying infectious source identified.  Dehydration could also be contributing.  Patient is not hypoglycemic.  No significant elevation of ammonia level. Head CT negative for acute finding.  Meningitis less likely given no headache/meningeal signs.  Currently awake and alert but oriented to self  only.  Does have baseline dementia.  Elevated LFTs: AST 243, ALT 313, alk phos 157, and T bili 3.9.  Lipase normal.  Abdominal exam benign.  CT abdomen pelvis without acute finding.  Right upper quadrant ultrasound showing biliary sludge without cholelithiasis or cholecystitis.  Showing evidence of hepatic steatosis.  Continue to monitor LFTs.  CKD stage III: Creatinine 2.6, was ranging between 1.9-2.9 a few months ago.  Continue IV fluid hydration and monitor renal function.  Constipation: CT showing significantly distended rectum with stool.  Dulcolax suppository ordered.  Alzheimer's dementia: Per pharmacy med rec, patient is currently on donezepil only.  Will hold at this time given worsening QT prolongation on EKG.  History of right lower extremity DVT: Continue Eliquis  QT prolongation on EKG: Cardiac monitoring.  Avoid QT prolonging drugs/hold home donezepil.  Keep potassium above 4 and magnesium above 2.  Repeat EKG in a.m.  Hypertension: Currently normotensive.  Hold antihypertensives at this time in the setting of sepsis.  Non-insulin-dependent type 2 diabetes: Check A1c.  Sliding scale insulin sensitive ACHS and CBG checks.  OSA: Continue CPAP at night  DVT prophylaxis: Eliquis Code Status: DNR per prior hospital records. Family Communication: No family available at this time. Disposition Plan: Anticipate discharge after clinical improvement. Consults called: None Admission status: It is my clinical opinion that admission to INPATIENT is reasonable and necessary because of the expectation that this patient will require hospital care that crosses at least 2 midnights to treat this condition based on the medical complexity of the problems presented.  Given the aforementioned information, the predictability of an adverse outcome is felt to be significant.  The medical decision making on this patient was of high complexity and the patient is at high risk for clinical deterioration,  therefore this is a level 3 visit.  Shela Leff MD [facility]  If 7PM-7AM, please contact night-coverage [email or web address]  03/05/2020, 11:02 PM

## 2020-03-05 NOTE — ED Notes (Signed)
Taken to ultrasound

## 2020-03-06 DIAGNOSIS — G9341 Metabolic encephalopathy: Secondary | ICD-10-CM

## 2020-03-06 LAB — BASIC METABOLIC PANEL
Anion gap: 13 (ref 5–15)
BUN: 27 mg/dL — ABNORMAL HIGH (ref 8–23)
CO2: 18 mmol/L — ABNORMAL LOW (ref 22–32)
Calcium: 8.5 mg/dL — ABNORMAL LOW (ref 8.9–10.3)
Chloride: 111 mmol/L (ref 98–111)
Creatinine, Ser: 2.64 mg/dL — ABNORMAL HIGH (ref 0.61–1.24)
GFR calc Af Amer: 25 mL/min — ABNORMAL LOW (ref >60)
GFR calc non Af Amer: 22 mL/min — ABNORMAL LOW (ref >60)
Glucose, Bld: 115 mg/dL — ABNORMAL HIGH (ref 70–99)
Potassium: 4.1 mmol/L (ref 3.5–5.1)
Sodium: 142 mmol/L (ref 135–145)

## 2020-03-06 LAB — HEPATIC FUNCTION PANEL
ALT: 202 U/L — ABNORMAL HIGH (ref 0–44)
AST: 112 U/L — ABNORMAL HIGH (ref 15–41)
Albumin: 2.8 g/dL — ABNORMAL LOW (ref 3.5–5.0)
Alkaline Phosphatase: 119 U/L (ref 38–126)
Bilirubin, Direct: 1 mg/dL — ABNORMAL HIGH (ref 0.0–0.2)
Indirect Bilirubin: 1.7 mg/dL — ABNORMAL HIGH (ref 0.3–0.9)
Total Bilirubin: 2.7 mg/dL — ABNORMAL HIGH (ref 0.3–1.2)
Total Protein: 6.1 g/dL — ABNORMAL LOW (ref 6.5–8.1)

## 2020-03-06 LAB — PROCALCITONIN: Procalcitonin: 1.57 ng/mL

## 2020-03-06 LAB — MAGNESIUM: Magnesium: 1.6 mg/dL — ABNORMAL LOW (ref 1.7–2.4)

## 2020-03-06 LAB — HEMOGLOBIN A1C
Hgb A1c MFr Bld: 6.8 % — ABNORMAL HIGH (ref 4.8–5.6)
Mean Plasma Glucose: 148.46 mg/dL

## 2020-03-06 LAB — CBG MONITORING, ED
Glucose-Capillary: 107 mg/dL — ABNORMAL HIGH (ref 70–99)
Glucose-Capillary: 116 mg/dL — ABNORMAL HIGH (ref 70–99)
Glucose-Capillary: 85 mg/dL (ref 70–99)

## 2020-03-06 LAB — LACTIC ACID, PLASMA
Lactic Acid, Venous: 3.1 mmol/L (ref 0.5–1.9)
Lactic Acid, Venous: 1.4 mmol/L (ref 0.5–1.9)

## 2020-03-06 LAB — SARS CORONAVIRUS 2 (TAT 6-24 HRS): SARS Coronavirus 2: NEGATIVE

## 2020-03-06 LAB — GLUCOSE, CAPILLARY
Glucose-Capillary: 102 mg/dL — ABNORMAL HIGH (ref 70–99)
Glucose-Capillary: 136 mg/dL — ABNORMAL HIGH (ref 70–99)

## 2020-03-06 MED ORDER — MAGNESIUM SULFATE 4 GM/100ML IV SOLN
4.0000 g | Freq: Once | INTRAVENOUS | Status: AC
  Administered 2020-03-06: 08:00:00 4 g via INTRAVENOUS
  Filled 2020-03-06: qty 100

## 2020-03-06 MED ORDER — SODIUM CHLORIDE 0.9 % IV BOLUS
500.0000 mL | Freq: Once | INTRAVENOUS | Status: AC
  Administered 2020-03-06: 07:00:00 500 mL via INTRAVENOUS

## 2020-03-06 MED ORDER — CHLORHEXIDINE GLUCONATE CLOTH 2 % EX PADS
6.0000 | MEDICATED_PAD | Freq: Every day | CUTANEOUS | Status: DC
  Administered 2020-03-07 – 2020-03-09 (×3): 6 via TOPICAL

## 2020-03-06 NOTE — Progress Notes (Signed)
PROGRESS NOTE    Tony Fox  ZOX:[id number] DOB: 1941/03/20 DOA: 03/05/2020 PCP: Willey Blade, MD   Brief Narrative:  Tony Fox is a 79 y.o. male with medical history significant of Alzheimer's dementia, BPH, GERD, hypertension, hyperlipidemia, OSA on CPAP, noninsulin-dependent type 2 diabetes, CKD stage III, right lower extremity DVT diagnosed in January 2021 presenting to the ED via EMS for evaluation of generalized weakness, fever, and altered mental status.  According to EMS, patient was difficult to arouse this morning and not acting normal.  They found him to be febrile and patient was complaining of some urinary symptoms. Patient is oriented to self only.  He is not sure why he is here.  He has no complaints.  Denies fevers, chills, headache, neck pain/stiffness, cough, shortness of breath, chest pain, nausea, vomiting, abdominal pain, diarrhea, dysuria, or urinary frequency/urgency.  No additional history could be obtained from him. In ED patient noted to be febrile with temperature 101.8 F.  Initially tachycardic and tachypneic, now resolved.  Not hypotensive.  Not hypoxic.  Labs showing no leukocytosis.  Lactic acid 3.3 >4.2.  Blood glucose 167.  BUN 26, creatinine 2.6.  Creatinine ranging between 1.9-2.9 a few months ago.  AST 243, ALT 313, alk phos 157, and T bili 3.9. Lipase normal.  No significant elevation of ammonia.  Blood culture x2 pending.  SARS-CoV-2 rapid antigen test negative, PCR test pending.  UA not suggestive of infection.  Urine culture pending. Hospitalist called to admit.  Assessment & Plan:   Principal Problem:   Sepsis ([place]) Active Problems:   OSA (obstructive sleep apnea)   Acute metabolic encephalopathy   Elevated LFTs   Type 2 diabetes mellitus (HCC)   Acute febrile illness meeting SIRS criteria, no source apparent at this time -as such he does not meet sepsis criteria, POA Patient initially febrile tachycardic and tachypneic on arrival but  now resolved with IV fluids and supportive care Patient remains without leukocytosis Lactic acid is elevated but again resolved with IV fluids Chest x-ray unremarkable for overt opacifications although very poor inspiratory effort CT abdomen pelvis negative for infectious source.   SARS-CoV-2 rapid antigen test negative. Procalcitonin elevated at 1.57 concerning for bacterial infection as such continue broad-spectrum antibiotics including vancomycin, cefepime, and metronidazole at this time.   Follow cultures; continue to attempt to identify source of likely infection  Acute metabolic encephalopathy:  Likely secondary to above yet to be discovered infection Patient is not hypoglycemic.  No significant elevation of ammonia level.  CT head unremarkable for acute findings  Meningitis unlikely given lack of headache or meningeal signs  Patient awake alert and oriented to self only, baseline dementia noted in chart, will follow with family to confirm baseline mental status  Acutely elevated LFTs:  Hepatic Function Latest Ref Rng & Units 03/06/2020 03/05/2020 11/24/2019  Total Protein 6.5 - 8.1 g/dL 6.1(L) 7.6 8.0  Albumin 3.5 - 5.0 g/dL 2.8(L) 3.4(L) 4.2  AST 15 - 41 U/L 112(H) 243(H) 46(H)  ALT 0 - 44 U/L 202(H) 313(H) 27  Alk Phosphatase 38 - 126 U/L 119 157(H) 48  Total Bilirubin 0.3 - 1.2 mg/dL 2.7(H) 3.9(H) 1.2  Bilirubin, Direct 0.0 - 0.2 mg/dL 1.0(H) - -  Abdominal exam benign.  CT abdomen pelvis without acute finding.   Right upper quadrant ultrasound showing biliary sludge without cholelithiasis or cholecystitis. Showing evidence of hepatic steatosis.   Unclear if elevated AST ALT are related to hemoconcentration and dehydration  Downtrending appropriately with  IV fluids   CKD stage III:  Lab Results  Component Value Date   CREATININE 2.64 (H) 03/06/2020   CREATININE 2.66 (H) 03/05/2020   CREATININE 2.90 (H) 12/25/2019  Baseline appears to be around 1.9-2.9 quite labile over  the past 6 months Stable despite IV fluids overnight, continue to follow, appears to be essentially at baseline at this point  Constipation:  CT showing significantly distended rectum with stool.  Dulcolax suppository ordered.  Alzheimer's dementia:  Per pharmacy med rec, patient is currently on donezepil only.   Will hold at this time given borderline QT prolongation on EKG as below  History of right lower extremity DVT: Continue Eliquis  QT prolongation on EKG:  Cardiac monitoring.  Avoid QT prolonging drugs/hold home donezepil.   Keep potassium above 4 and magnesium above 2.  Repeat EKG in a.m.  Hypertension:  Currently normotensive.  Hold antihypertensives at this time in the setting of infection, restart if patient becomes hypertensive.  Non-insulin-dependent type 2 diabetes: Check A1c.  Sliding scale insulin sensitive ACHS and CBG checks.  OSA: Continue CPAP at night  DVT prophylaxis: Eliquis Code Status: DNR per prior hospital records. Family Communication: No family available at this time. Disposition Plan: Anticipate discharge after clinical improvement. Consults called: None Admission status: It is my clinical opinion that admission to INPATIENT is reasonable and necessary because of the expectation that this patient will require hospital care that crosses at least 2 midnights to treat this condition based on the medical complexity of the problems presented.  Given the aforementioned information, the predictability of an adverse outcome is felt to be significant.  Subjective: No acute issues or events overnight, review of systems somewhat limited given patient's mental status but he currently denies any overt symptoms denies cough, sputum production, urinary hesitancy frequency urgency, diarrhea.  Objective: Vitals:   03/06/20 0545 03/06/20 0600 03/06/20 0629 03/06/20 0732  BP:  108/74  (!) 126/29  Pulse: 68   69  Resp: 13 (!) '9 18 17  '$ Temp:    98.6 F (37  C)  TempSrc:    Oral  SpO2: 97%   98%  Weight:      Height:       No intake or output data in the 24 hours ending 03/06/20 0736 Filed Weights   03/05/20 1800  Weight: 91.2 kg    Examination:  General:  Pleasantly resting in bed, No acute distress.  Alert to person only HEENT:  Normocephalic atraumatic.  Sclerae nonicteric, noninjected.  Extraocular movements intact bilaterally. Neck:  Without mass or deformity.  Trachea is midline. Lungs:  Clear to auscultate bilaterally without rhonchi, wheeze, or rales. Heart:  Regular rate and rhythm.  Without murmurs, rubs, or gallops. Abdomen:  Soft, nontender, nondistended.  Without guarding or rebound. Extremities: Without cyanosis, clubbing, edema, or obvious deformity. Vascular:  Dorsalis pedis and posterior tibial pulses palpable bilaterally. Skin:  Warm and dry, no erythema, no ulcerations.  Data Reviewed: I have personally reviewed following labs and imaging studies  CBC: Recent Labs  Lab 03/05/20 1340  WBC 8.6  NEUTROABS 7.5  HGB 15.1  HCT 47.3  MCV 89.8  PLT 976   Basic Metabolic Panel: Recent Labs  Lab 03/05/20 1340 03/06/20 0104 03/06/20 0322  NA 142  --  142  K 4.1  --  4.1  CL 105  --  111  CO2 23  --  18*  GLUCOSE 167*  --  115*  BUN 26*  --  27*  CREATININE 2.66*  --  2.64*  CALCIUM 9.3  --  8.5*  MG  --  1.6*  --    GFR: Estimated Creatinine Clearance: 24.5 mL/min (A) (by C-G formula based on SCr of 2.64 mg/dL (H)). Liver Function Tests: Recent Labs  Lab 03/05/20 1340 03/06/20 0322  AST 243* 112*  ALT 313* 202*  ALKPHOS 157* 119  BILITOT 3.9* 2.7*  PROT 7.6 6.1*  ALBUMIN 3.4* 2.8*   Recent Labs  Lab 03/05/20 1604  LIPASE 35   Recent Labs  Lab 03/05/20 1604  AMMONIA 38*   Coagulation Profile: Recent Labs  Lab 03/05/20 1340  INR 1.1   Cardiac Enzymes: No results for input(s): CKTOTAL, CKMB, CKMBINDEX, TROPONINI in the last 168 hours. BNP (last 3 results) No results for input(s):  PROBNP in the last 8760 hours. HbA1C: Recent Labs    03/06/20 0104  HGBA1C 6.8*   CBG: Recent Labs  Lab 03/06/20 0047  GLUCAP 116*   Lipid Profile: No results for input(s): CHOL, HDL, LDLCALC, TRIG, CHOLHDL, LDLDIRECT in the last 72 hours. Thyroid Function Tests: No results for input(s): TSH, T4TOTAL, FREET4, T3FREE, THYROIDAB in the last 72 hours. Anemia Panel: No results for input(s): VITAMINB12, FOLATE, FERRITIN, TIBC, IRON, RETICCTPCT in the last 72 hours. Sepsis Labs: Recent Labs  Lab 03/05/20 1340 03/05/20 1604 03/06/20 0104  PROCALCITON  --   --  1.57  LATICACIDVEN 3.3* 4.2* 3.1*    Recent Results (from the past 240 hour(s))  SARS CORONAVIRUS 2 (TAT 6-24 HRS) Nasopharyngeal Nasopharyngeal Swab     Status: None   Collection Time: 03/05/20  7:00 PM   Specimen: Nasopharyngeal Swab  Result Value Ref Range Status   SARS Coronavirus 2 NEGATIVE NEGATIVE Final    Comment: (NOTE) SARS-CoV-2 target nucleic acids are NOT DETECTED. The SARS-CoV-2 RNA is generally detectable in upper and lower respiratory specimens during the acute phase of infection. Negative results do not preclude SARS-CoV-2 infection, do not rule out co-infections with other pathogens, and should not be used as the sole basis for treatment or other patient management decisions. Negative results must be combined with clinical observations, patient history, and epidemiological information. The expected result is Negative. Fact Sheet for Patients: SugarRoll.be Fact Sheet for Healthcare Providers: https://www.woods-mathews.com/ This test is not yet approved or cleared by the [place] FDA and  has been authorized for detection and/or diagnosis of SARS-CoV-2 by FDA under an Emergency Use Authorization (EUA). This EUA will remain  in effect (meaning this test can be used) for the duration of the COVID-19 declaration under Section 56 4(b)(1) of the Act, 21  U.S.C. section 360bbb-3(b)(1), unless the authorization is terminated or revoked sooner. Performed at [facility], [place] [address] [place], [place] [address]          Radiology Studies: CT ABDOMEN PELVIS WO CONTRAST  Result Date: 03/05/2020 CLINICAL DATA:  79 year old male with fever of 101.4, suspected sepsis. past medical history significant for bladder cancer, diabetes, hyperlipidemia, prior DVT on Eliquis, and sleep apnea who presents for fatigue, altered mental status, reported dysuria, and fever. According to EMS, patient was difficult to arouse this morning and not acting normal. They found him to have a fever and patient was complaining of some urinary symptoms. They brought him to evaluation to the emergency department and was found to have fever and tachycardia. EXAM: CT ABDOMEN AND PELVIS WITHOUT CONTRAST TECHNIQUE: Multidetector CT imaging of the abdomen and pelvis was performed following the standard protocol without  IV contrast. COMPARISON:  CT, 02/12/2018 FINDINGS: Lower chest: No acute abnormality. Hepatobiliary: Normal liver. Gallbladder is distended, but there is no gallbladder wall thickening or adjacent inflammation. No visualized stones. No bile duct dilation. Pancreas: Unremarkable. No pancreatic ductal dilatation or surrounding inflammatory changes. Spleen: Normal in size without focal abnormality. Adrenals/Urinary Tract: No adrenal masses. Bilateral renal cortical thinning overall mildly reduced renal sizes. Subtle, nonspecific low-attenuation mass, midpole the left kidney, 11 mm, most likely a cyst. No other masses. Bilateral nonobstructing intrarenal stones. No hydronephrosis. Ureters are normal in course and in caliber. Bladder is unremarkable. Stomach/Bowel: Rectum is moderate to markedly distended with stool. No wall thickening or adjacent inflammation. Colon is normal in caliber. Mild colonic stool burden. No colonic wall thickening or inflammation. Stomach and  small bowel are unremarkable. Appendix not visualized. No evidence of appendicitis. Vascular/Lymphatic: Aortic atherosclerotic calcifications. No aneurysm. No enlarged lymph nodes. Reproductive: Unremarkable. Other: No abdominal wall hernia or abnormality. No abdominopelvic ascites. Musculoskeletal: No fracture or acute finding. No osteoblastic or osteolytic lesions. IMPRESSION: 1. No acute findings within the abdomen or pelvis. No findings to account for the patient's fever. 2. Distended gallbladder, without CT evidence of acute cholecystitis. 3. Bilateral intrarenal stones.  No hydronephrosis. 4. Rectum significantly distended with stool. Mild increase in colonic stool burden. No bowel obstruction or inflammation. 5. Aortic atherosclerosis. Electronically Signed   By: Lajean Manes M.D.   On: 03/05/2020 19:20   DG Chest 2 View  Result Date: 03/05/2020 CLINICAL DATA:  79 year old male with fever of 101.4, suspected sepsis. EXAM: CHEST - 2 VIEW COMPARISON:  Portable chest 11/25/2019 and earlier. FINDINGS: Upright AP and lateral views of the chest. Low lung volumes. Mediastinal contours remain within normal limits. Visualized tracheal air column is within normal limits. No pneumothorax or pulmonary edema. No pleural effusion or confluent pulmonary opacity. No acute osseous abnormality identified. Visible bowel-gas pattern within normal limits. IMPRESSION: Low lung volumes, otherwise no acute cardiopulmonary abnormality. Electronically Signed   By: Genevie Ann M.D.   On: 03/05/2020 13:45   CT Head Wo Contrast  Result Date: 03/05/2020 CLINICAL DATA:  Confusion EXAM: CT HEAD WITHOUT CONTRAST TECHNIQUE: Contiguous axial images were obtained from the base of the skull through the vertex without intravenous contrast. COMPARISON:  11/24/2019 FINDINGS: Brain: There is atrophy and chronic small vessel disease changes. No acute intracranial abnormality. Specifically, no hemorrhage, hydrocephalus, mass lesion, acute  infarction, or significant intracranial injury. Vascular: No hyperdense vessel or unexpected calcification. Skull: No acute calvarial abnormality. Sinuses/Orbits: Visualized paranasal sinuses and mastoids clear. Orbital soft tissues unremarkable. Other: None IMPRESSION: Advanced chronic microvascular disease and diffuse cerebral atrophy. No acute intracranial abnormality. Electronically Signed   By: Rolm Baptise M.D.   On: 03/05/2020 22:10   US Abdomen Limited RUQ  Result Date: 03/05/2020 CLINICAL DATA:  Elevated liver function tests EXAM: ULTRASOUND ABDOMEN LIMITED RIGHT UPPER QUADRANT COMPARISON:  11/26/2019, 02/12/2018 FINDINGS: Gallbladder: Extensive biliary sludge is identified. No shadowing gallstones. No gallbladder wall thickening or pericholecystic fluid. Evaluation is limited by bowel gas and patient cooperation. Common bile duct: Diameter: 5 mm Liver: Left lobe liver obscured by bowel gas. Visualized portions of the liver demonstrate diffuse increased echotexture consistent with hepatic steatosis. No intrahepatic duct dilation. Portal vein is patent on color Doppler imaging with normal direction of blood flow towards the liver. Other: None. IMPRESSION: 1. Biliary sludge, without cholelithiasis or cholecystitis. 2. Limited evaluation due to bowel gas and patient cooperation. 3. Hepatic steatosis. Electronically Signed   By:  Randa Ngo M.D.   On: 03/05/2020 17:43        Scheduled Meds: . apixaban  2.5 mg Oral BID  . aspirin EC  81 mg Oral Daily  . bisacodyl  10 mg Rectal Once  . insulin aspart  0-5 Units Subcutaneous QHS  . insulin aspart  0-9 Units Subcutaneous TID WC   Continuous Infusions: . sodium chloride 125 mL/hr at 03/05/20 2321  . ceFEPime (MAXIPIME) IV    . magnesium sulfate bolus IVPB    . metronidazole 500 mg (03/06/20 0644)  . [START ON 03/07/2020] vancomycin       LOS: 1 day   Time spent: 34mn  Jodelle Fausto C Cheryel Kyte, DO [facility]  If 7PM-7AM, please  contact night-coverage [email or web address]  03/06/2020, 7:36 AM

## 2020-03-06 NOTE — ED Notes (Signed)
Tele   bfast ordered  

## 2020-03-06 NOTE — ED Notes (Signed)
Pt awake, oriented to self and able to express needs.  Calm at this point.

## 2020-03-07 LAB — BLOOD CULTURE ID PANEL (REFLEXED)

## 2020-03-07 LAB — CBC
HCT: 37.4 % — ABNORMAL LOW (ref 39.0–52.0)
Hemoglobin: 12.1 g/dL — ABNORMAL LOW (ref 13.0–17.0)
MCH: 29.7 pg (ref 26.0–34.0)
MCHC: 32.4 g/dL (ref 30.0–36.0)
MCV: 91.9 fL (ref 80.0–100.0)
Platelets: 133 10*3/uL — ABNORMAL LOW (ref 150–400)
RBC: 4.07 MIL/uL — ABNORMAL LOW (ref 4.22–5.81)
RDW: 15 % (ref 11.5–15.5)
WBC: 3.6 10*3/uL — ABNORMAL LOW (ref 4.0–10.5)
nRBC: 0 % (ref 0.0–0.2)

## 2020-03-07 LAB — COMPREHENSIVE METABOLIC PANEL
ALT: 115 U/L — ABNORMAL HIGH (ref 0–44)
AST: 50 U/L — ABNORMAL HIGH (ref 15–41)
Albumin: 2.3 g/dL — ABNORMAL LOW (ref 3.5–5.0)
Alkaline Phosphatase: 91 U/L (ref 38–126)
Anion gap: 9 (ref 5–15)
BUN: 25 mg/dL — ABNORMAL HIGH (ref 8–23)
CO2: 18 mmol/L — ABNORMAL LOW (ref 22–32)
Calcium: 8.2 mg/dL — ABNORMAL LOW (ref 8.9–10.3)
Chloride: 113 mmol/L — ABNORMAL HIGH (ref 98–111)
Creatinine, Ser: 2.32 mg/dL — ABNORMAL HIGH (ref 0.61–1.24)
GFR calc Af Amer: 30 mL/min — ABNORMAL LOW (ref >60)
GFR calc non Af Amer: 26 mL/min — ABNORMAL LOW (ref >60)
Glucose, Bld: 99 mg/dL (ref 70–99)
Potassium: 4.1 mmol/L (ref 3.5–5.1)
Sodium: 140 mmol/L (ref 135–145)
Total Bilirubin: 0.8 mg/dL (ref 0.3–1.2)
Total Protein: 5.5 g/dL — ABNORMAL LOW (ref 6.5–8.1)

## 2020-03-07 LAB — URINE CULTURE

## 2020-03-07 LAB — GLUCOSE, CAPILLARY
Glucose-Capillary: 85 mg/dL (ref 70–99)
Glucose-Capillary: 100 mg/dL — ABNORMAL HIGH (ref 70–99)
Glucose-Capillary: 144 mg/dL — ABNORMAL HIGH (ref 70–99)
Glucose-Capillary: 142 mg/dL — ABNORMAL HIGH (ref 70–99)

## 2020-03-07 MED ORDER — APIXABAN 5 MG PO TABS
5.0000 mg | ORAL_TABLET | Freq: Two times a day (BID) | ORAL | Status: DC
  Administered 2020-03-07 – 2020-03-12 (×9): 5 mg via ORAL
  Filled 2020-03-07 (×10): qty 1

## 2020-03-07 NOTE — Progress Notes (Addendum)
PROGRESS NOTE    Tony Fox  RWE:[id number] DOB: [birth year] DOA: 03/05/2020 PCP: Willey Blade, MD   Brief Narrative:  Tony Fox is a 79 y.o. male with medical history significant of Alzheimer's dementia, BPH, GERD, hypertension, hyperlipidemia, OSA on CPAP, noninsulin-dependent type 2 diabetes, CKD stage III, right lower extremity DVT diagnosed in January 2021 presenting to the ED via EMS for evaluation of generalized weakness, fever, and altered mental status.  According to EMS, patient was difficult to arouse this morning and not acting normal.  They found him to be febrile and patient was complaining of some urinary symptoms. Patient is oriented to self only.  He is not sure why he is here.  He has no complaints.  Denies fevers, chills, headache, neck pain/stiffness, cough, shortness of breath, chest pain, nausea, vomiting, abdominal pain, diarrhea, dysuria, or urinary frequency/urgency.  No additional history could be obtained from him. In ED patient noted to be febrile with temperature 101.8 F.  Initially tachycardic and tachypneic, now resolved.  Not hypotensive.  Not hypoxic.  Labs showing no leukocytosis.  Lactic acid 3.3 >4.2.  Blood glucose 167.  BUN 26, creatinine 2.6.  Creatinine ranging between 1.9-2.9 a few months ago.  AST 243, ALT 313, alk phos 157, and T bili 3.9. Lipase normal.  No significant elevation of ammonia.  Blood culture x2 pending.  SARS-CoV-2 rapid antigen test negative, PCR test pending.  UA not suggestive of infection.  Urine culture pending. Hospitalist called to admit.  Assessment & Plan:   Principal Problem:   Sepsis ([place]) Active Problems:   OSA (obstructive sleep apnea)   Acute metabolic encephalopathy   Elevated LFTs   Type 2 diabetes mellitus (HCC)   Sepsis secondary to presumed strep bacteremia given cultures as below, POA Patient initially febrile tachycardic and tachypneic on arrival but now resolved with IV fluids and supportive  care Patient initially did not meet sepsis criteria as there was no known source, blood cultures resulted with strep species, unspecified Lactic acidosis resolved with IV fluids Imaging including chest x-ray and CT abdomen pelvis unremarkable for any acute findings  SARS-CoV-2 rapid antigen test negative. Procalcitonin elevated at 1.57 concerning for bacterial infection as such continue broad-spectrum antibiotics including vancomycin, cefepime, and metronidazole at this time -we will hold off on de-escalation of antibiotics given possibility that blood cultures could be contaminant and true source has not yet been identified..   Follow cultures; preliminary results of blood culture for strep species, unclear if this is truly infectious or contaminant  Acute metabolic encephalopathy:  Likely secondary to above Patient is not hypoglycemic.  No significant elevation of ammonia level.  CT head unremarkable for acute findings  Meningitis unlikely given lack of headache or meningeal signs  Patient awake alert and oriented to self only, baseline dementia noted in chart, will follow with family to confirm baseline mental status  Acutely elevated LFTs:  Hepatic Function Latest Ref Rng & Units 03/07/2020 03/06/2020 03/05/2020  Total Protein 6.5 - 8.1 g/dL 5.5(L) 6.1(L) 7.6  Albumin 3.5 - 5.0 g/dL 2.3(L) 2.8(L) 3.4(L)  AST 15 - 41 U/L 50(H) 112(H) 243(H)  ALT 0 - 44 U/L 115(H) 202(H) 313(H)  Alk Phosphatase 38 - 126 U/L 91 119 157(H)  Total Bilirubin 0.3 - 1.2 mg/dL 0.8 2.7(H) 3.9(H)  Bilirubin, Direct 0.0 - 0.2 mg/dL - 1.0(H) -  Abdominal exam benign.  CT abdomen pelvis without acute finding.   Right upper quadrant ultrasound showing biliary sludge without cholelithiasis or cholecystitis.  Showing evidence of hepatic steatosis.   Unclear if elevated AST ALT are related to hemoconcentration and dehydration  Downtrending appropriately with IV fluids.  CKD stage III:  Lab Results  Component Value  Date   CREATININE 2.32 (H) 03/07/2020   CREATININE 2.64 (H) 03/06/2020   CREATININE 2.66 (H) 03/05/2020  Baseline appears to be around 1.9-2.9: quite labile over the past 6 months Stable despite IV fluids overnight, continue to follow, appears to be essentially at baseline at this point  Constipation:  CT showing significantly distended rectum with stool.  Dulcolax suppository ordered No BM per documentation - will advance regimen.  Alzheimer's dementia:  Per pharmacy med rec, patient is currently on donezepil only.   Will hold at this time given borderline QT prolongation on EKG as below  History of right lower extremity DVT: Continue Eliquis  QT prolongation on EKG:  Cardiac monitoring.  Avoid QT prolonging drugs/hold home donezepil.   Keep potassium above 4 and magnesium above 2.  Repeat EKG in a.m.  Hypertension:  Currently normotensive.  Hold antihypertensives at this time in the setting of infection, restart if patient becomes hypertensive.  Non-insulin-dependent type 2 diabetes: Check A1c.  Sliding scale insulin sensitive ACHS and CBG checks.  OSA: Continue CPAP at night  DVT prophylaxis: Eliquis Code Status: DNR per prior hospital records. Family Communication:  Wife updated over the phone Disposition Plan:  Pending clinical course, at this point discharge home per family wishes, possible need for discharge to SNF again pending clinical course and PT OT evaluation once patient is more awake alert and able to participate with physical therapy Consults called: None Admission status:  Inpatient, patient continues to require IV fluids, antibiotics in the setting of sepsis concerning for bacteremia.  Subjective: No acute issues or events overnight, review of systems somewhat limited given patient's mental status but he currently denies any overt symptoms denies cough, sputum production, urinary hesitancy frequency urgency, diarrhea.  Objective: Vitals:   03/06/20  2023 03/06/20 2023 03/07/20 0020 03/07/20 0407  BP: 117/78 117/78 130/79 131/81  Pulse: 63 63 60 (!) 58  Resp: '16 16 16 16  '$ Temp: (!) 97.5 F (36.4 C) (!) 97.5 F (36.4 C) (!) 97.5 F (36.4 C) (!) 97.5 F (36.4 C)  TempSrc: Oral Oral Oral Oral  SpO2: 97% 98% 99% 98%  Weight:  87.5 kg    Height:        Intake/Output Summary (Last 24 hours) at 03/07/2020 0747 Last data filed at 03/07/2020 0600 Gross per 24 hour  Intake 1843.48 ml  Output 850 ml  Net 993.48 ml   Filed Weights   03/05/20 1800 03/06/20 2023  Weight: 91.2 kg 87.5 kg    Examination:  General:  Pleasantly resting in bed, No acute distress.  Alert to person only HEENT:  Normocephalic atraumatic.  Sclerae nonicteric, noninjected.  Extraocular movements intact bilaterally. Neck:  Without mass or deformity.  Trachea is midline. Lungs:  Clear to auscultate bilaterally without rhonchi, wheeze, or rales. Heart:  Regular rate and rhythm.  Without murmurs, rubs, or gallops. Abdomen:  Soft, nontender, nondistended.  Without guarding or rebound. Extremities: Without cyanosis, clubbing, edema, or obvious deformity. Vascular:  Dorsalis pedis and posterior tibial pulses palpable bilaterally. Skin:  Warm and dry, no erythema, no ulcerations.  Data Reviewed: I have personally reviewed following labs and imaging studies  CBC: Recent Labs  Lab 03/05/20 1340 03/07/20 0449  WBC 8.6 3.6*  NEUTROABS 7.5  --   HGB 15.1 12.1*  HCT 47.3 37.4*  MCV 89.8 91.9  PLT 176 364*   Basic Metabolic Panel: Recent Labs  Lab 03/05/20 1340 03/06/20 0104 03/06/20 0322 03/07/20 0449  NA 142  --  142 140  K 4.1  --  4.1 4.1  CL 105  --  111 113*  CO2 23  --  18* 18*  GLUCOSE 167*  --  115* 99  BUN 26*  --  27* 25*  CREATININE 2.66*  --  2.64* 2.32*  CALCIUM 9.3  --  8.5* 8.2*  MG  --  1.6*  --   --    GFR: Estimated Creatinine Clearance: 27.9 mL/min (A) (by C-G formula based on SCr of 2.32 mg/dL (H)). Liver Function  Tests: Recent Labs  Lab 03/05/20 1340 03/06/20 0322 03/07/20 0449  AST 243* 112* 50*  ALT 313* 202* 115*  ALKPHOS 157* 119 91  BILITOT 3.9* 2.7* 0.8  PROT 7.6 6.1* 5.5*  ALBUMIN 3.4* 2.8* 2.3*   Recent Labs  Lab 03/05/20 1604  LIPASE 35   Recent Labs  Lab 03/05/20 1604  AMMONIA 38*   Coagulation Profile: Recent Labs  Lab 03/05/20 1340  INR 1.1   Cardiac Enzymes: No results for input(s): CKTOTAL, CKMB, CKMBINDEX, TROPONINI in the last 168 hours. BNP (last 3 results) No results for input(s): PROBNP in the last 8760 hours. HbA1C: Recent Labs    03/06/20 0104  HGBA1C 6.8*   CBG: Recent Labs  Lab 03/06/20 0757 03/06/20 1144 03/06/20 1615 03/06/20 2113 03/07/20 0636  GLUCAP 85 107* 102* 136* 85   Lipid Profile: No results for input(s): CHOL, HDL, LDLCALC, TRIG, CHOLHDL, LDLDIRECT in the last 72 hours. Thyroid Function Tests: No results for input(s): TSH, T4TOTAL, FREET4, T3FREE, THYROIDAB in the last 72 hours. Anemia Panel: No results for input(s): VITAMINB12, FOLATE, FERRITIN, TIBC, IRON, RETICCTPCT in the last 72 hours. Sepsis Labs: Recent Labs  Lab 03/05/20 1340 03/05/20 1604 03/06/20 0104 03/06/20 0740  PROCALCITON  --   --  1.57  --   LATICACIDVEN 3.3* 4.2* 3.1* 1.4    Recent Results (from the past 240 hour(s))  Culture, blood (Routine x 2)     Status: None (Preliminary result)   Collection Time: 03/05/20  1:40 PM   Specimen: BLOOD  Result Value Ref Range Status   Specimen Description BLOOD SITE NOT SPECIFIED  Final   Special Requests   Final    BOTTLES DRAWN AEROBIC AND ANAEROBIC Blood Culture adequate volume   Culture   Final    NO GROWTH < 24 HOURS Performed at [facility], [place] [address] [place], [place] [address]    Report Status PENDING  Incomplete  Culture, blood (Routine x 2)     Status: None (Preliminary result)   Collection Time: 03/05/20  4:04 PM   Specimen: BLOOD  Result Value Ref Range Status   Specimen Description  BLOOD  Final   Special Requests   Final    BOTTLES DRAWN AEROBIC AND ANAEROBIC Blood Culture adequate volume   Culture  Setup Time   Final    ANAEROBIC BOTTLE ONLY GRAM POSITIVE COCCI IN CHAINS Organism ID to follow CRITICAL RESULT CALLED TO, READ BACK BY AND VERIFIED WITH: Karsten Ro [facility] 03/06/20 2357 JDW Performed at [facility], [place] [address], [place], [place] [address]    Culture PENDING  Incomplete   Report Status PENDING  Incomplete  Blood Culture ID Panel (Reflexed)     Status: Abnormal   Collection  Time: 03/05/20  4:04 PM  Result Value Ref Range Status   Enterococcus species NOT DETECTED NOT DETECTED Final   Listeria monocytogenes NOT DETECTED NOT DETECTED Final   Staphylococcus species NOT DETECTED NOT DETECTED Final   Staphylococcus aureus (BCID) NOT DETECTED NOT DETECTED Final   Streptococcus species DETECTED (A) NOT DETECTED Final    Comment: Not Enterococcus species, Streptococcus agalactiae, Streptococcus pyogenes, or Streptococcus pneumoniae. CRITICAL RESULT CALLED TO, READ BACK BY AND VERIFIED WITH: J LEDFORD PHARMD 03/06/20 2357 JDW    Streptococcus agalactiae NOT DETECTED NOT DETECTED Final   Streptococcus pneumoniae NOT DETECTED NOT DETECTED Final   Streptococcus pyogenes NOT DETECTED NOT DETECTED Final   Acinetobacter baumannii NOT DETECTED NOT DETECTED Final   Enterobacteriaceae species NOT DETECTED NOT DETECTED Final   Enterobacter cloacae complex NOT DETECTED NOT DETECTED Final   Escherichia coli NOT DETECTED NOT DETECTED Final   Klebsiella oxytoca NOT DETECTED NOT DETECTED Final   Klebsiella pneumoniae NOT DETECTED NOT DETECTED Final   Proteus species NOT DETECTED NOT DETECTED Final   Serratia marcescens NOT DETECTED NOT DETECTED Final   Haemophilus influenzae NOT DETECTED NOT DETECTED Final   Neisseria meningitidis NOT DETECTED NOT DETECTED Final   Pseudomonas aeruginosa NOT DETECTED NOT DETECTED Final   Candida albicans NOT DETECTED NOT DETECTED  Final   Candida glabrata NOT DETECTED NOT DETECTED Final   Candida krusei NOT DETECTED NOT DETECTED Final   Candida parapsilosis NOT DETECTED NOT DETECTED Final   Candida tropicalis NOT DETECTED NOT DETECTED Final    Comment: Performed at [facility], [place] [address], [place], [place] [address]  SARS CORONAVIRUS 2 (TAT 6-24 HRS) Nasopharyngeal Nasopharyngeal Swab     Status: None   Collection Time: 03/05/20  7:00 PM   Specimen: Nasopharyngeal Swab  Result Value Ref Range Status   SARS Coronavirus 2 NEGATIVE NEGATIVE Final    Comment: (NOTE) SARS-CoV-2 target nucleic acids are NOT DETECTED. The SARS-CoV-2 RNA is generally detectable in upper and lower respiratory specimens during the acute phase of infection. Negative results do not preclude SARS-CoV-2 infection, do not rule out co-infections with other pathogens, and should not be used as the sole basis for treatment or other patient management decisions. Negative results must be combined with clinical observations, patient history, and epidemiological information. The expected result is Negative. Fact Sheet for Patients: SugarRoll.be Fact Sheet for Healthcare Providers: https://www.woods-mathews.com/ This test is not yet approved or cleared by the [place] FDA and  has been authorized for detection and/or diagnosis of SARS-CoV-2 by FDA under an Emergency Use Authorization (EUA). This EUA will remain  in effect (meaning this test can be used) for the duration of the COVID-19 declaration under Section 56 4(b)(1) of the Act, 21 U.S.C. section 360bbb-3(b)(1), unless the authorization is terminated or revoked sooner. Performed at [facility], [place] [address] [place], [place] [address]          Radiology Studies: CT ABDOMEN PELVIS WO CONTRAST  Result Date: 03/05/2020 CLINICAL DATA:  79 year old male with fever of 101.4, suspected sepsis. past medical history significant  for bladder cancer, diabetes, hyperlipidemia, prior DVT on Eliquis, and sleep apnea who presents for fatigue, altered mental status, reported dysuria, and fever. According to EMS, patient was difficult to arouse this morning and not acting normal. They found him to have a fever and patient was complaining of some urinary symptoms. They brought him to evaluation to the emergency department and was found to have fever and tachycardia.  EXAM: CT ABDOMEN AND PELVIS WITHOUT CONTRAST TECHNIQUE: Multidetector CT imaging of the abdomen and pelvis was performed following the standard protocol without IV contrast. COMPARISON:  CT, 02/12/2018 FINDINGS: Lower chest: No acute abnormality. Hepatobiliary: Normal liver. Gallbladder is distended, but there is no gallbladder wall thickening or adjacent inflammation. No visualized stones. No bile duct dilation. Pancreas: Unremarkable. No pancreatic ductal dilatation or surrounding inflammatory changes. Spleen: Normal in size without focal abnormality. Adrenals/Urinary Tract: No adrenal masses. Bilateral renal cortical thinning overall mildly reduced renal sizes. Subtle, nonspecific low-attenuation mass, midpole the left kidney, 11 mm, most likely a cyst. No other masses. Bilateral nonobstructing intrarenal stones. No hydronephrosis. Ureters are normal in course and in caliber. Bladder is unremarkable. Stomach/Bowel: Rectum is moderate to markedly distended with stool. No wall thickening or adjacent inflammation. Colon is normal in caliber. Mild colonic stool burden. No colonic wall thickening or inflammation. Stomach and small bowel are unremarkable. Appendix not visualized. No evidence of appendicitis. Vascular/Lymphatic: Aortic atherosclerotic calcifications. No aneurysm. No enlarged lymph nodes. Reproductive: Unremarkable. Other: No abdominal wall hernia or abnormality. No abdominopelvic ascites. Musculoskeletal: No fracture or acute finding. No osteoblastic or osteolytic lesions.  IMPRESSION: 1. No acute findings within the abdomen or pelvis. No findings to account for the patient's fever. 2. Distended gallbladder, without CT evidence of acute cholecystitis. 3. Bilateral intrarenal stones.  No hydronephrosis. 4. Rectum significantly distended with stool. Mild increase in colonic stool burden. No bowel obstruction or inflammation. 5. Aortic atherosclerosis. Electronically Signed   By: Lajean Manes M.D.   On: 03/05/2020 19:20   DG Chest 2 View  Result Date: 03/05/2020 CLINICAL DATA:  79 year old male with fever of 101.4, suspected sepsis. EXAM: CHEST - 2 VIEW COMPARISON:  Portable chest 11/25/2019 and earlier. FINDINGS: Upright AP and lateral views of the chest. Low lung volumes. Mediastinal contours remain within normal limits. Visualized tracheal air column is within normal limits. No pneumothorax or pulmonary edema. No pleural effusion or confluent pulmonary opacity. No acute osseous abnormality identified. Visible bowel-gas pattern within normal limits. IMPRESSION: Low lung volumes, otherwise no acute cardiopulmonary abnormality. Electronically Signed   By: Genevie Ann M.D.   On: 03/05/2020 13:45   CT Head Wo Contrast  Result Date: 03/05/2020 CLINICAL DATA:  Confusion EXAM: CT HEAD WITHOUT CONTRAST TECHNIQUE: Contiguous axial images were obtained from the base of the skull through the vertex without intravenous contrast. COMPARISON:  11/24/2019 FINDINGS: Brain: There is atrophy and chronic small vessel disease changes. No acute intracranial abnormality. Specifically, no hemorrhage, hydrocephalus, mass lesion, acute infarction, or significant intracranial injury. Vascular: No hyperdense vessel or unexpected calcification. Skull: No acute calvarial abnormality. Sinuses/Orbits: Visualized paranasal sinuses and mastoids clear. Orbital soft tissues unremarkable. Other: None IMPRESSION: Advanced chronic microvascular disease and diffuse cerebral atrophy. No acute intracranial abnormality.  Electronically Signed   By: Rolm Baptise M.D.   On: 03/05/2020 22:10   US Abdomen Limited RUQ  Result Date: 03/05/2020 CLINICAL DATA:  Elevated liver function tests EXAM: ULTRASOUND ABDOMEN LIMITED RIGHT UPPER QUADRANT COMPARISON:  11/26/2019, 02/12/2018 FINDINGS: Gallbladder: Extensive biliary sludge is identified. No shadowing gallstones. No gallbladder wall thickening or pericholecystic fluid. Evaluation is limited by bowel gas and patient cooperation. Common bile duct: Diameter: 5 mm Liver: Left lobe liver obscured by bowel gas. Visualized portions of the liver demonstrate diffuse increased echotexture consistent with hepatic steatosis. No intrahepatic duct dilation. Portal vein is patent on color Doppler imaging with normal direction of blood flow towards the liver. Other: None. IMPRESSION: 1. Biliary  sludge, without cholelithiasis or cholecystitis. 2. Limited evaluation due to bowel gas and patient cooperation. 3. Hepatic steatosis. Electronically Signed   By: Randa Ngo M.D.   On: 03/05/2020 17:43        Scheduled Meds: . apixaban  2.5 mg Oral BID  . aspirin EC  81 mg Oral Daily  . bisacodyl  10 mg Rectal Once  . Chlorhexidine Gluconate Cloth  6 each Topical Daily  . insulin aspart  0-5 Units Subcutaneous QHS  . insulin aspart  0-9 Units Subcutaneous TID WC   Continuous Infusions: . sodium chloride 75 mL/hr at 03/06/20 1838  . ceFEPime (MAXIPIME) IV 2 g (03/06/20 1839)  . metronidazole 500 mg (03/07/20 0536)  . vancomycin       LOS: 2 days   Time spent: 36mn  Rickelle Sylvestre C Coleen Cardiff, DO [facility]  If 7PM-7AM, please contact night-coverage [email or web address]  03/07/2020, 7:47 AM

## 2020-03-07 NOTE — Progress Notes (Signed)
PHARMACY - PHYSICIAN COMMUNICATION CRITICAL VALUE ALERT - BLOOD CULTURE IDENTIFICATION (BCID)  Tony FRIEDEN is an 79 y.o. male who presented to [facility] on [facility]/05/2020 with a chief complaint of weakness, fever  Assessment:  WBC WNL, last temp 97.5  Name of physician (or Provider) Contacted: [place] ([facility])  Current antibiotics: Vancomycin/Cefepime/Flagyl   Changes to prescribed antibiotics recommended:  No changes for now  Results for orders placed or performed during the hospital encounter of 03/05/20  Blood Culture ID Panel (Reflexed) (Collected: 03/05/2020  4:04 PM)  Result Value Ref Range   Enterococcus species NOT DETECTED NOT DETECTED   Listeria monocytogenes NOT DETECTED NOT DETECTED   Staphylococcus species NOT DETECTED NOT DETECTED   Staphylococcus aureus (BCID) NOT DETECTED NOT DETECTED   Streptococcus species DETECTED (A) NOT DETECTED   Streptococcus agalactiae NOT DETECTED NOT DETECTED   Streptococcus pneumoniae NOT DETECTED NOT DETECTED   Streptococcus pyogenes NOT DETECTED NOT DETECTED   Acinetobacter baumannii NOT DETECTED NOT DETECTED   Enterobacteriaceae species NOT DETECTED NOT DETECTED   Enterobacter cloacae complex NOT DETECTED NOT DETECTED   Escherichia coli NOT DETECTED NOT DETECTED   Klebsiella oxytoca NOT DETECTED NOT DETECTED   Klebsiella pneumoniae NOT DETECTED NOT DETECTED   Proteus species NOT DETECTED NOT DETECTED   Serratia marcescens NOT DETECTED NOT DETECTED   Haemophilus influenzae NOT DETECTED NOT DETECTED   Neisseria meningitidis NOT DETECTED NOT DETECTED   Pseudomonas aeruginosa NOT DETECTED NOT DETECTED   Candida albicans NOT DETECTED NOT DETECTED   Candida glabrata NOT DETECTED NOT DETECTED   Candida krusei NOT DETECTED NOT DETECTED   Candida parapsilosis NOT DETECTED NOT DETECTED   Candida tropicalis NOT DETECTED NOT DETECTED    Narda Bonds 03/07/2020  1:23 AM

## 2020-03-07 NOTE — Progress Notes (Signed)
Patient sleeping by the time the RT arrived to the room to place pt on CPAP. Will begin CPAP on 4/24 night. J Yoland Scherr RT [id number]

## 2020-03-08 LAB — COMPREHENSIVE METABOLIC PANEL
ALT: 91 U/L — ABNORMAL HIGH (ref 0–44)
AST: 36 U/L (ref 15–41)
Albumin: 2.5 g/dL — ABNORMAL LOW (ref 3.5–5.0)
Alkaline Phosphatase: 94 U/L (ref 38–126)
Anion gap: 9 (ref 5–15)
BUN: 20 mg/dL (ref 8–23)
CO2: 18 mmol/L — ABNORMAL LOW (ref 22–32)
Calcium: 8.4 mg/dL — ABNORMAL LOW (ref 8.9–10.3)
Chloride: 111 mmol/L (ref 98–111)
Creatinine, Ser: 1.93 mg/dL — ABNORMAL HIGH (ref 0.61–1.24)
GFR calc Af Amer: 37 mL/min — ABNORMAL LOW (ref >60)
GFR calc non Af Amer: 32 mL/min — ABNORMAL LOW (ref >60)
Glucose, Bld: 122 mg/dL — ABNORMAL HIGH (ref 70–99)
Potassium: 4.1 mmol/L (ref 3.5–5.1)
Sodium: 138 mmol/L (ref 135–145)
Total Bilirubin: 0.9 mg/dL (ref 0.3–1.2)
Total Protein: 6.2 g/dL — ABNORMAL LOW (ref 6.5–8.1)

## 2020-03-08 LAB — GLUCOSE, CAPILLARY
Glucose-Capillary: 86 mg/dL (ref 70–99)
Glucose-Capillary: 92 mg/dL (ref 70–99)
Glucose-Capillary: 107 mg/dL — ABNORMAL HIGH (ref 70–99)
Glucose-Capillary: 151 mg/dL — ABNORMAL HIGH (ref 70–99)

## 2020-03-08 LAB — CULTURE, BLOOD (ROUTINE X 2): Special Requests: ADEQUATE

## 2020-03-08 LAB — CBC
HCT: 41 % (ref 39.0–52.0)
Hemoglobin: 13 g/dL (ref 13.0–17.0)
MCH: 28.9 pg (ref 26.0–34.0)
MCHC: 31.7 g/dL (ref 30.0–36.0)
MCV: 91.1 fL (ref 80.0–100.0)
Platelets: 154 10*3/uL (ref 150–400)
RBC: 4.5 MIL/uL (ref 4.22–5.81)
RDW: 14.6 % (ref 11.5–15.5)
WBC: 4.1 10*3/uL (ref 4.0–10.5)
nRBC: 0 % (ref 0.0–0.2)

## 2020-03-08 MED ORDER — VANCOMYCIN HCL IN DEXTROSE 1-5 GM/200ML-% IV SOLN
1000.0000 mg | INTRAVENOUS | Status: DC
  Administered 2020-03-08 – 2020-03-09 (×2): 1000 mg via INTRAVENOUS
  Filled 2020-03-08 (×2): qty 200

## 2020-03-08 NOTE — Discharge Instructions (Signed)
Information on my medicine - ELIQUIS (apixaban)  This medication education was reviewed with me or my healthcare representative as part of my discharge preparation.  The pharmacist that spoke with me during my hospital stay was:  Wayland Salinas, [facility]  Why was Eliquis prescribed for you? Eliquis was prescribed to treat blood clots that may have been found in the veins of your legs (deep vein thrombosis) or in your lungs (pulmonary embolism) and to reduce the risk of them occurring again.  What do You need to know about Eliquis ? The dose is ONE 5 mg tablet taken TWICE daily.  Eliquis may be taken with or without food.   Try to take the dose about the same time in the morning and in the evening. If you have difficulty swallowing the tablet whole please discuss with your pharmacist how to take the medication safely.  Take Eliquis exactly as prescribed and DO NOT stop taking Eliquis without talking to the doctor who prescribed the medication.  Stopping may increase your risk of developing a new blood clot.  Refill your prescription before you run out.  After discharge, you should have regular check-up appointments with your healthcare provider that is prescribing your Eliquis.    What do you do if you miss a dose? If a dose of ELIQUIS is not taken at the scheduled time, take it as soon as possible on the same day and twice-daily administration should be resumed. The dose should not be doubled to make up for a missed dose.  Important Safety Information A possible side effect of Eliquis is bleeding. You should call your healthcare provider right away if you experience any of the following: ? Bleeding from an injury or your nose that does not stop. ? Unusual colored urine (red or dark brown) or unusual colored stools (red or black). ? Unusual bruising for unknown reasons. ? A serious fall or if you hit your head (even if there is no bleeding).  Some medicines may interact  with Eliquis and might increase your risk of bleeding or clotting while on Eliquis. To help avoid this, consult your healthcare provider or pharmacist prior to using any new prescription or non-prescription medications, including herbals, vitamins, non-steroidal anti-inflammatory drugs (NSAIDs) and supplements.  This website has more information on Eliquis (apixaban): http://www.eliquis.com/eliquis/home

## 2020-03-08 NOTE — Progress Notes (Signed)
PROGRESS NOTE    Tony Fox  GNO:[id number] DOB: 1941-04-27 DOA: 03/05/2020 PCP: Willey Blade, MD   Brief Narrative:  Tony Fox is a 79 y.o. male with medical history significant of Alzheimer's dementia, BPH, GERD, hypertension, hyperlipidemia, OSA on CPAP, noninsulin-dependent type 2 diabetes, CKD stage III, right lower extremity DVT diagnosed in January 2021 presenting to the ED via EMS for evaluation of generalized weakness, fever, and altered mental status.  According to EMS, patient was difficult to arouse this morning and not acting normal.  They found him to be febrile and patient was complaining of some urinary symptoms. Patient is oriented to self only.  He is not sure why he is here.  He has no complaints.  Denies fevers, chills, headache, neck pain/stiffness, cough, shortness of breath, chest pain, nausea, vomiting, abdominal pain, diarrhea, dysuria, or urinary frequency/urgency.  No additional history could be obtained from him. In ED patient noted to be febrile with temperature 101.8 F.  Initially tachycardic and tachypneic, now resolved.  Not hypotensive.  Not hypoxic.  Labs showing no leukocytosis.  Lactic acid 3.3 >4.2.  Blood glucose 167.  BUN 26, creatinine 2.6.  Creatinine ranging between 1.9-2.9 a few months ago.  AST 243, ALT 313, alk phos 157, and T bili 3.9. Lipase normal.  No significant elevation of ammonia.  Blood culture x2 pending.  SARS-CoV-2 rapid antigen test negative, PCR test pending.  UA not suggestive of infection.  Urine culture pending. Hospitalist called to admit.  Assessment & Plan:   Principal Problem:   Sepsis ([place]) Active Problems:   OSA (obstructive sleep apnea)   Acute metabolic encephalopathy   Elevated LFTs   Type 2 diabetes mellitus (HCC)   Sepsis secondary to presumed strep bacteremia given cultures as below, POA Patient initially febrile tachycardic and tachypneic on arrival but now resolved with IV fluids and supportive  care Patient initially did not initially meet sepsis criteria as there was no source, blood cultures resulted with strep species, unspecified as below Lactic acidosis resolved with IV fluids Imaging including chest x-ray and CT abdomen pelvis unremarkable for any acute findings  SARS-CoV-2 rapid antigen test negative. Procalcitonin elevated at 1.57  Broad-spectrum antibiotics including vancomycin, cefepime, and metronidazole at admission Continue vancomycin and cefepime only given species and sensitivities thus far Blood culture 1 out of 2, remains concern for contaminant and false positive source however repeat blood cultures will be ordered to ensure appropriate results and treatment.  Urine culture growing multiple species, unclear if dirty catch or abnormal UTI  Acute metabolic encephalopathy on baseline dementia, improving:  Likely secondary to above Patient is not hypoglycemic.  No significant elevation of ammonia level.  CT head unremarkable for acute findings  Meningitis unlikely given lack of headache or meningeal signs  Patient awake alert and oriented to self only, baseline dementia noted in chart, he indicates he is very close to his baseline at this point  Acutely elevated LFTs:  Hepatic Function Latest Ref Rng & Units 03/07/2020 03/06/2020 03/05/2020  Total Protein 6.5 - 8.1 g/dL 5.5(L) 6.1(L) 7.6  Albumin 3.5 - 5.0 g/dL 2.3(L) 2.8(L) 3.4(L)  AST 15 - 41 U/L 50(H) 112(H) 243(H)  ALT 0 - 44 U/L 115(H) 202(H) 313(H)  Alk Phosphatase 38 - 126 U/L 91 119 157(H)  Total Bilirubin 0.3 - 1.2 mg/dL 0.8 2.7(H) 3.9(H)  Bilirubin, Direct 0.0 - 0.2 mg/dL - 1.0(H) -  Abdominal exam benign.  CT abdomen pelvis without acute finding.   Right upper  quadrant ultrasound showing biliary sludge without cholelithiasis or cholecystitis. Showing evidence of hepatic steatosis.   Unclear if elevated AST ALT are related to hemoconcentration and dehydration  Downtrending appropriately with IV  fluids.  CKD stage III:  Lab Results  Component Value Date   CREATININE 2.32 (H) 03/07/2020   CREATININE 2.64 (H) 03/06/2020   CREATININE 2.66 (H) 03/05/2020  Baseline appears to be around 1.9-2.9: quite labile over the past 6 months Stable despite IV fluids overnight, continue to follow, appears to be essentially at baseline at this point  Constipation:  CT showing significantly distended rectum with stool.  Dulcolax suppository ordered No BM per documentation - will advance regimen.  Alzheimer's dementia:  Per pharmacy med rec, patient is currently on donezepil only.   Will hold at this time given borderline QT prolongation on EKG as below  History of right lower extremity DVT: Continue Eliquis  QT prolongation on EKG:  Cardiac monitoring.  Avoid QT prolonging drugs/hold home donezepil.   Keep potassium above 4 and magnesium above 2.  Repeat EKG in a.m.  Hypertension:  Currently normotensive.  Hold antihypertensives at this time in the setting of infection, restart if patient becomes hypertensive.  Non-insulin-dependent type 2 diabetes: Check A1c.  Sliding scale insulin sensitive ACHS and CBG checks.  OSA: Continue CPAP at night  DVT prophylaxis: Eliquis Code Status: DNR per prior hospital records. Family Communication:  Wife updated over the phone Disposition Plan:  Pending clinical course, at this point discharge home per family wishes, possible need for discharge to SNF again pending clinical course and PT OT evaluation once patient is more awake alert and able to participate with physical therapy Consults called: None Admission status:  Inpatient, patient continues to require IV fluids, antibiotics in the setting of sepsis concerning for bacteremia.  Subjective: No acute issues or events overnight, review of systems somewhat limited given patient's mental status but he currently denies any overt symptoms denies cough, sputum production, urinary hesitancy  frequency urgency, diarrhea.  Objective: Vitals:   03/07/20 1623 03/07/20 2108 03/08/20 0427 03/08/20 0428  BP: 134/76 129/78 135/90 135/90  Pulse: (!) 59 (!) 58 (!) 59 60  Resp: '18 16 15 15  '$ Temp: 98.1 F (36.7 C) 97.7 F (36.5 C) 97.7 F (36.5 C) 97.7 F (36.5 C)  TempSrc: Oral Oral Oral Oral  SpO2: 96% 97% 98% 98%  Weight:  88.6 kg    Height:        Intake/Output Summary (Last 24 hours) at 03/08/2020 0739 Last data filed at 03/08/2020 0600 Gross per 24 hour  Intake 1100 ml  Output 1700 ml  Net -600 ml   Filed Weights   03/05/20 1800 03/06/20 2023 03/07/20 2108  Weight: 91.2 kg 87.5 kg 88.6 kg    Examination:  General:  Pleasantly resting in bed, No acute distress.  Alert to person only HEENT:  Normocephalic atraumatic.  Sclerae nonicteric, noninjected.  Extraocular movements intact bilaterally. Neck:  Without mass or deformity.  Trachea is midline. Lungs:  Clear to auscultate bilaterally without rhonchi, wheeze, or rales. Heart:  Regular rate and rhythm.  Without murmurs, rubs, or gallops. Abdomen:  Soft, nontender, nondistended.  Without guarding or rebound. Extremities: Without cyanosis, clubbing, edema, or obvious deformity. Vascular:  Dorsalis pedis and posterior tibial pulses palpable bilaterally. Skin:  Warm and dry, no erythema, no ulcerations.  Data Reviewed: I have personally reviewed following labs and imaging studies  CBC: Recent Labs  Lab 03/05/20 1340 03/07/20 0449  WBC 8.6  3.6*  NEUTROABS 7.5  --   HGB 15.1 12.1*  HCT 47.3 37.4*  MCV 89.8 91.9  PLT 176 665*   Basic Metabolic Panel: Recent Labs  Lab 03/05/20 1340 03/06/20 0104 03/06/20 0322 03/07/20 0449  NA 142  --  142 140  K 4.1  --  4.1 4.1  CL 105  --  111 113*  CO2 23  --  18* 18*  GLUCOSE 167*  --  115* 99  BUN 26*  --  27* 25*  CREATININE 2.66*  --  2.64* 2.32*  CALCIUM 9.3  --  8.5* 8.2*  MG  --  1.6*  --   --    GFR: Estimated Creatinine Clearance: 27.9 mL/min (A) (by  C-G formula based on SCr of 2.32 mg/dL (H)). Liver Function Tests: Recent Labs  Lab 03/05/20 1340 03/06/20 0322 03/07/20 0449  AST 243* 112* 50*  ALT 313* 202* 115*  ALKPHOS 157* 119 91  BILITOT 3.9* 2.7* 0.8  PROT 7.6 6.1* 5.5*  ALBUMIN 3.4* 2.8* 2.3*   Recent Labs  Lab 03/05/20 1604  LIPASE 35   Recent Labs  Lab 03/05/20 1604  AMMONIA 38*   Coagulation Profile: Recent Labs  Lab 03/05/20 1340  INR 1.1   Cardiac Enzymes: No results for input(s): CKTOTAL, CKMB, CKMBINDEX, TROPONINI in the last 168 hours. BNP (last 3 results) No results for input(s): PROBNP in the last 8760 hours. HbA1C: Recent Labs    03/06/20 0104  HGBA1C 6.8*   CBG: Recent Labs  Lab 03/07/20 0636 03/07/20 1117 03/07/20 1621 03/07/20 2108 03/08/20 0642  GLUCAP 85 100* 144* 142* 86   Lipid Profile: No results for input(s): CHOL, HDL, LDLCALC, TRIG, CHOLHDL, LDLDIRECT in the last 72 hours. Thyroid Function Tests: No results for input(s): TSH, T4TOTAL, FREET4, T3FREE, THYROIDAB in the last 72 hours. Anemia Panel: No results for input(s): VITAMINB12, FOLATE, FERRITIN, TIBC, IRON, RETICCTPCT in the last 72 hours. Sepsis Labs: Recent Labs  Lab 03/05/20 1340 03/05/20 1604 03/06/20 0104 03/06/20 0740  PROCALCITON  --   --  1.57  --   LATICACIDVEN 3.3* 4.2* 3.1* 1.4    Recent Results (from the past 240 hour(s))  Culture, blood (Routine x 2)     Status: None (Preliminary result)   Collection Time: 03/05/20  1:40 PM   Specimen: BLOOD  Result Value Ref Range Status   Specimen Description BLOOD SITE NOT SPECIFIED  Final   Special Requests   Final    BOTTLES DRAWN AEROBIC AND ANAEROBIC Blood Culture adequate volume   Culture   Final    NO GROWTH 2 DAYS Performed at [facility], [place] [address] [place], [place] [address]    Report Status PENDING  Incomplete  Culture, blood (Routine x 2)     Status: Abnormal (Preliminary result)   Collection Time: 03/05/20  4:04 PM   Specimen:  BLOOD  Result Value Ref Range Status   Specimen Description BLOOD  Final   Special Requests   Final    BOTTLES DRAWN AEROBIC AND ANAEROBIC Blood Culture adequate volume   Culture  Setup Time   Final    ANAEROBIC BOTTLE ONLY GRAM POSITIVE COCCI IN CHAINS CRITICAL RESULT CALLED TO, READ BACK BY AND VERIFIED WITH: J LEDFORD PHARMD 03/06/20 2357 JDW    Culture (A)  Final    VIRIDANS STREPTOCOCCUS THE SIGNIFICANCE OF ISOLATING THIS ORGANISM FROM A SINGLE SET OF BLOOD CULTURES WHEN MULTIPLE SETS ARE DRAWN IS UNCERTAIN. PLEASE NOTIFY THE  MICROBIOLOGY DEPARTMENT WITHIN ONE WEEK IF SPECIATION AND SENSITIVITIES ARE REQUIRED. Performed at [facility], [place] [address] [place], [place] [address]    Report Status PENDING  Incomplete  Blood Culture ID Panel (Reflexed)     Status: Abnormal   Collection Time: 03/05/20  4:04 PM  Result Value Ref Range Status   Enterococcus species NOT DETECTED NOT DETECTED Final   Listeria monocytogenes NOT DETECTED NOT DETECTED Final   Staphylococcus species NOT DETECTED NOT DETECTED Final   Staphylococcus aureus (BCID) NOT DETECTED NOT DETECTED Final   Streptococcus species DETECTED (A) NOT DETECTED Final    Comment: Not Enterococcus species, Streptococcus agalactiae, Streptococcus pyogenes, or Streptococcus pneumoniae. CRITICAL RESULT CALLED TO, READ BACK BY AND VERIFIED WITH: J LEDFORD PHARMD 03/06/20 2357 JDW    Streptococcus agalactiae NOT DETECTED NOT DETECTED Final   Streptococcus pneumoniae NOT DETECTED NOT DETECTED Final   Streptococcus pyogenes NOT DETECTED NOT DETECTED Final   Acinetobacter baumannii NOT DETECTED NOT DETECTED Final   Enterobacteriaceae species NOT DETECTED NOT DETECTED Final   Enterobacter cloacae complex NOT DETECTED NOT DETECTED Final   Escherichia coli NOT DETECTED NOT DETECTED Final   Klebsiella oxytoca NOT DETECTED NOT DETECTED Final   Klebsiella pneumoniae NOT DETECTED NOT DETECTED Final   Proteus species NOT DETECTED NOT  DETECTED Final   Serratia marcescens NOT DETECTED NOT DETECTED Final   Haemophilus influenzae NOT DETECTED NOT DETECTED Final   Neisseria meningitidis NOT DETECTED NOT DETECTED Final   Pseudomonas aeruginosa NOT DETECTED NOT DETECTED Final   Candida albicans NOT DETECTED NOT DETECTED Final   Candida glabrata NOT DETECTED NOT DETECTED Final   Candida krusei NOT DETECTED NOT DETECTED Final   Candida parapsilosis NOT DETECTED NOT DETECTED Final   Candida tropicalis NOT DETECTED NOT DETECTED Final    Comment: Performed at [facility], [place] [address], [place], [place] [address]  Urine culture     Status: Abnormal   Collection Time: 03/05/20  6:36 PM   Specimen: Urine, Clean Catch  Result Value Ref Range Status   Specimen Description URINE, CLEAN CATCH  Final   Special Requests   Final    NONE Performed at [facility], [place] [address], [place], [place] [address]    Culture MULTIPLE SPECIES PRESENT, SUGGEST RECOLLECTION (A)  Final   Report Status 03/07/2020 FINAL  Final  SARS CORONAVIRUS 2 (TAT 6-24 HRS) Nasopharyngeal Nasopharyngeal Swab     Status: None   Collection Time: 03/05/20  7:00 PM   Specimen: Nasopharyngeal Swab  Result Value Ref Range Status   SARS Coronavirus 2 NEGATIVE NEGATIVE Final    Comment: (NOTE) SARS-CoV-2 target nucleic acids are NOT DETECTED. The SARS-CoV-2 RNA is generally detectable in upper and lower respiratory specimens during the acute phase of infection. Negative results do not preclude SARS-CoV-2 infection, do not rule out co-infections with other pathogens, and should not be used as the sole basis for treatment or other patient management decisions. Negative results must be combined with clinical observations, patient history, and epidemiological information. The expected result is Negative. Fact Sheet for Patients: SugarRoll.be Fact Sheet for Healthcare  Providers: https://www.woods-mathews.com/ This test is not yet approved or cleared by the [place] FDA and  has been authorized for detection and/or diagnosis of SARS-CoV-2 by FDA under an Emergency Use Authorization (EUA). This EUA will remain  in effect (meaning this test can be used) for the duration of the COVID-19 declaration under Section 56 4(b)(1) of the Act,  21 U.S.C. section 360bbb-3(b)(1), unless the authorization is terminated or revoked sooner. Performed at [facility], [place] [address] [place], [place] [address]          Radiology Studies: No results found.      Scheduled Meds: . apixaban  5 mg Oral BID  . aspirin EC  81 mg Oral Daily  . bisacodyl  10 mg Rectal Once  . Chlorhexidine Gluconate Cloth  6 each Topical Daily  . insulin aspart  0-5 Units Subcutaneous QHS  . insulin aspart  0-9 Units Subcutaneous TID WC   Continuous Infusions: . ceFEPime (MAXIPIME) IV 2 g (03/07/20 1648)  . metronidazole 500 mg (03/08/20 0627)  . vancomycin 1,500 mg (03/07/20 1748)     LOS: 3 days   Time spent: 38mn  Shameek Nyquist C Tevon Berhane, DO [facility]  If 7PM-7AM, please contact night-coverage [email or web address]  03/08/2020, 7:39 AM

## 2020-03-08 NOTE — Progress Notes (Signed)
Pharmacy Antibiotic Note  Tony Fox is a 79 y.o. male admitted on 03/05/2020 with r/o sepsis.  Pharmacy has been consulted for Vanco/Cefepime dosing.   ID: Sepsis, Afebrile. WBC 3.6. Scr 1.93 down. LA down to 1.4, PC 1.57. Possibly BC contaminant  Vanco 4/23>> Cefepime 4/23>> Flagyl 4/23>>  4/22 BCx 2>> Viridans strep in 1 out of 4. 4/22: BCID strep 4/22 UCx>> mult species  4/25: Change to Vanco 1000mg /24h (AuC 486.7, Scr 1.93, VD 0.72)  Plan: Change to Vanco 1000mg /24h (AuC 486.7, Scr 1.93, VD 0.72) Cefepime 2g IV Q24 hrs  - 4/24: Eliquis incr 5mg  BID discussed with Dr. Avon Gully. (dose reduced in March by Willey Blade outpatient). Should be on 5mg  BID treatment dose. ok'd to increase dose. Tried to discuss with pt but gets confused.  Lysandra Loughmiller S. Alford Highland, PharmD, BCPS Clinical Staff Pharmacist [email or web address]   Height: 6' (182.9 cm) Weight: 88.6 kg (195 lb 5.2 oz) IBW/kg (Calculated) : 77.6  Temp (24hrs), Avg:97.9 F (36.6 C), Min:97.7 F (36.5 C), Max:98.4 F (36.9 C)  Recent Labs  Lab 03/05/20 1340 03/05/20 1604 03/06/20 0104 03/06/20 0322 03/06/20 0740 03/07/20 0449 03/08/20 0833  WBC 8.6  --   --   --   --  3.6* 4.1  CREATININE 2.66*  --   --  2.64*  --  2.32* 1.93*  LATICACIDVEN 3.3* 4.2* 3.1*  --  1.4  --   --     Estimated Creatinine Clearance: 33.5 mL/min (A) (by C-G formula based on SCr of 1.93 mg/dL (H)).    No Known Allergies  Tony Fox S. Alford Highland, PharmD, BCPS Clinical Staff Pharmacist [email or web address]  Tony Fox 03/08/2020 9:23 AM

## 2020-03-09 ENCOUNTER — Inpatient Hospital Stay (HOSPITAL_COMMUNITY): Payer: Medicare PPO

## 2020-03-09 DIAGNOSIS — K7200 Acute and subacute hepatic failure without coma: Secondary | ICD-10-CM

## 2020-03-09 DIAGNOSIS — R6520 Severe sepsis without septic shock: Secondary | ICD-10-CM

## 2020-03-09 DIAGNOSIS — R7989 Other specified abnormal findings of blood chemistry: Secondary | ICD-10-CM

## 2020-03-09 DIAGNOSIS — N1832 Chronic kidney disease, stage 3b: Secondary | ICD-10-CM

## 2020-03-09 DIAGNOSIS — E1121 Type 2 diabetes mellitus with diabetic nephropathy: Secondary | ICD-10-CM

## 2020-03-09 DIAGNOSIS — R7881 Bacteremia: Secondary | ICD-10-CM

## 2020-03-09 DIAGNOSIS — A408 Other streptococcal sepsis: Secondary | ICD-10-CM

## 2020-03-09 LAB — ECHOCARDIOGRAM COMPLETE
Height: 72 in
Weight: 3125.45 oz

## 2020-03-09 LAB — GLUCOSE, CAPILLARY
Glucose-Capillary: 89 mg/dL (ref 70–99)
Glucose-Capillary: 91 mg/dL (ref 70–99)
Glucose-Capillary: 88 mg/dL (ref 70–99)
Glucose-Capillary: 122 mg/dL — ABNORMAL HIGH (ref 70–99)

## 2020-03-09 LAB — COMPREHENSIVE METABOLIC PANEL
ALT: 76 U/L — ABNORMAL HIGH (ref 0–44)
AST: 44 U/L — ABNORMAL HIGH (ref 15–41)
Albumin: 2.5 g/dL — ABNORMAL LOW (ref 3.5–5.0)
Alkaline Phosphatase: 80 U/L (ref 38–126)
Anion gap: 10 (ref 5–15)
BUN: 24 mg/dL — ABNORMAL HIGH (ref 8–23)
CO2: 17 mmol/L — ABNORMAL LOW (ref 22–32)
Calcium: 8.2 mg/dL — ABNORMAL LOW (ref 8.9–10.3)
Chloride: 112 mmol/L — ABNORMAL HIGH (ref 98–111)
Creatinine, Ser: 2.03 mg/dL — ABNORMAL HIGH (ref 0.61–1.24)
GFR calc Af Amer: 35 mL/min — ABNORMAL LOW (ref >60)
GFR calc non Af Amer: 30 mL/min — ABNORMAL LOW (ref >60)
Glucose, Bld: 98 mg/dL (ref 70–99)
Potassium: 4.3 mmol/L (ref 3.5–5.1)
Sodium: 139 mmol/L (ref 135–145)
Total Bilirubin: 0.6 mg/dL (ref 0.3–1.2)
Total Protein: 5.5 g/dL — ABNORMAL LOW (ref 6.5–8.1)

## 2020-03-09 LAB — CBC
HCT: 37.4 % — ABNORMAL LOW (ref 39.0–52.0)
Hemoglobin: 12.2 g/dL — ABNORMAL LOW (ref 13.0–17.0)
MCH: 29.1 pg (ref 26.0–34.0)
MCHC: 32.6 g/dL (ref 30.0–36.0)
MCV: 89.3 fL (ref 80.0–100.0)
Platelets: 163 10*3/uL (ref 150–400)
RBC: 4.19 MIL/uL — ABNORMAL LOW (ref 4.22–5.81)
RDW: 14.6 % (ref 11.5–15.5)
WBC: 4.2 10*3/uL (ref 4.0–10.5)
nRBC: 0 % (ref 0.0–0.2)

## 2020-03-09 NOTE — TOC Initial Note (Signed)
Transition of Care [facility]) - Initial/Assessment Note    Patient Details  Name: Tony Fox MRN: [id number] Date of Birth: [birth year]  [facility] CM/SW Contact:    Pollie Friar, RN Phone Number: 03/09/2020, 12:27 PM  Clinical Narrative:                 CM met with the patient. He states his wife does his medications at the home. He denies transportation issues, stating his wife can do the driving.  Pt states his wife has other obligations outside the home that prevent 24/7 supervision.  TOC following for d/c needs/ disposition.  Expected Discharge Plan: Home/Self Care Barriers to Discharge: Continued Medical Work up   Patient Goals and CMS Choice        Expected Discharge Plan and Services Expected Discharge Plan: Home/Self Care   Discharge Planning Services: CM Consult   Living arrangements for the past 2 months: Single Family Home                                      Prior Living Arrangements/Services Living arrangements for the past 2 months: Single Family Home Lives with:: Spouse Patient language and need for interpreter reviewed:: Yes Do you feel safe going back to the place where you live?: Yes      Need for Family Participation in Patient Care: Yes (Comment) Care giver support system in place?: No (comment)(states wife is not available 24/7) Current home services: DME Criminal Activity/Legal Involvement Pertinent to Current Situation/Hospitalization: No - Comment as needed  Activities of Daily Living      Permission Sought/Granted                  Emotional Assessment Appearance:: Appears stated age Attitude/Demeanor/Rapport: Engaged, Ambitious Affect (typically observed): Accepting Orientation: : Oriented to Self, Oriented to Place   Psych Involvement: No (comment)  Admission diagnosis:  LFT elevation [R79.89] Sepsis ([place]) [A41.9] Patient Active Problem List   Diagnosis Date Noted  . Sepsis ([place]) 03/05/2020  .  Acute metabolic encephalopathy 02/63/7858  . Elevated LFTs 03/05/2020  . Type 2 diabetes mellitus ([place]) 03/05/2020  . Acute deep vein thrombosis (DVT) of right femoral vein ([place]) 12/04/2019  . OSA (obstructive sleep apnea) 12/04/2019  . Dyslipidemia associated with type 2 diabetes mellitus ([place]) 12/04/2019  . Vitamin D deficiency 12/04/2019  . Vascular dementia without behavioral disturbance ([place]) 12/04/2019  . Physical deconditioning 12/04/2019  . Elevated troponin level not due to acute coronary syndrome 11/24/2019  . AKI (acute kidney injury) ([place]) 11/24/2019  . Elevated d-dimer 11/24/2019  . Lactic acidosis 11/24/2019  . Generalized weakness 11/24/2019  . Thrombocytopenia ([place]) 11/24/2019  . Memory loss 07/16/2015  . History of bladder cancer 07/16/2015  . Hypertension associated with type 2 diabetes mellitus ([place]) 07/16/2015  . Hyperlipidemia 07/16/2015  . Uncomplicated type 2 diabetes mellitus ([place]) 07/16/2015   PCP:  Willey Blade, MD Pharmacy:   [facility] [address], [place] [address] Phone: [phone] Fax: [place], [place] [address] Phone: [phone] Fax: [phone]     Social Determinants of Health (SDOH) Interventions    Readmission Risk Interventions Readmission Risk Prevention Plan 03/09/2020  Transportation Screening Complete  Medication  Review Press photographer) Complete  SW Recovery Care/Counseling Consult Not Complete  Some recent data might be hidden

## 2020-03-09 NOTE — Evaluation (Signed)
Physical Therapy Evaluation Patient Details Name: Tony Fox MRN: [id number] DOB: [birth year] Today's Date: 03/09/2020   History of Present Illness  Tony Fox is a 79 y.o. male with medical history significant of Alzheimer's dementia, BPH, GERD, hypertension, hyperlipidemia, OSA on CPAP, noninsulin-dependent type 2 diabetes, CKD stage III, right lower extremity DVT diagnosed in January 2021 presenting to the ED via EMS for evaluation of generalized weakness, fever, and altered mental status. Found to be septic  Clinical Impression   Pt admitted with above diagnosis.  Unable to give me a clear picture of his home situation and PLOF;  Per chart review, he comes from home, where he lives with his wife, and was walking household distance with assistive device; Presents to PT with significant functional deficits and decline, weakness, difficulty with weight shifting for scooting and sit to stand transfers; Mod assist to help him get to EOB; Unable to get to full stand with +2/3 assist from elevated bed;  Pt currently with functional limitations due to the deficits listed below (see PT Problem List). Pt will benefit from skilled PT to increase their independence and safety with mobility to allow discharge to the venue listed below.       Follow Up Recommendations SNF    Equipment Recommendations  3in1 (PT)    Recommendations for Other Services       Precautions / Restrictions Precautions Precautions: Fall      Mobility  Bed Mobility Overal bed mobility: Needs Assistance Bed Mobility: Supine to Sit     Supine to sit: Mod assist     General bed mobility comments: Mod assist to roll R and push up to sit  Transfers Overall transfer level: Needs assistance Equipment used: Rolling walker (2 wheeled) Transfers: Sit to/from Stand Sit to Stand: Total assist;+2 physical assistance         General transfer comment: attempted sit to stand transfer x3 with RW and with stedy;  Unable to clear hips with +2/3 assist; tending to push back to sit down  Ambulation/Gait             General Gait Details: Unable to attempt  Stairs            Wheelchair Mobility    Modified Rankin (Stroke Patients Only)       Balance                                             Pertinent Vitals/Pain Pain Assessment: Faces Faces Pain Scale: No hurt    Home Living Family/patient expects to be discharged to:: Unsure Living Arrangements: Spouse/significant other Available Help at Discharge: Family Type of Home: House           Additional Comments: Pt unable to give any details about home; Gleaned some home and PLOF info from chart review    Prior Function Level of Independence: Needs assistance   Gait / Transfers Assistance Needed: Per chart review, he uses a cane or walker for ambulation  ADL's / Homemaking Assistance Needed: wife assist  Comments: As of OT note dated 12/05/19: wife has been helping pt with adls for quite some time. He could get up to the bathroom and go to the kitchen to get what he wanted. Lately she and sons have been helping him try to get up     Hand Dominance  Extremity/Trunk Assessment   Upper Extremity Assessment Upper Extremity Assessment: Generalized weakness    Lower Extremity Assessment Lower Extremity Assessment: Generalized weakness       Communication   Communication: No difficulties  Cognition Arousal/Alertness: Awake/alert Behavior During Therapy: WFL for tasks assessed/performed(Super tangential ) Overall Cognitive Status: No family/caregiver present to determine baseline cognitive functioning Area of Impairment: Orientation;Attention;Following commands                 Orientation Level: Disoriented to;Time;Situation;Place Current Attention Level: Sustained(easily internally distracted from task at hand)   Following Commands: Follows one step commands with increased  time;Follows one step commands inconsistently              General Comments      Exercises     Assessment/Plan    PT Assessment Patient needs continued PT services  PT Problem List Decreased strength;Decreased range of motion;Decreased activity tolerance;Decreased balance;Decreased mobility;Decreased coordination;Decreased cognition;Decreased knowledge of use of DME;Decreased safety awareness;Decreased knowledge of precautions       PT Treatment Interventions DME instruction;Gait training;Functional mobility training;Therapeutic activities;Therapeutic exercise;Balance training;Neuromuscular re-education;Cognitive remediation;Patient/family education    PT Goals (Current goals can be found in the Care Plan section)  Acute Rehab PT Goals Patient Stated Goal: Unable to state PT Goal Formulation: Patient unable to participate in goal setting Time For Goal Achievement: 03/23/20 Potential to Achieve Goals: Fair    Frequency Min 2X/week   Barriers to discharge        Co-evaluation               AM-PAC PT "6 Clicks" Mobility  Outcome Measure Help needed turning from your back to your side while in a flat bed without using bedrails?: A Little Help needed moving from lying on your back to sitting on the side of a flat bed without using bedrails?: A Lot Help needed moving to and from a bed to a chair (including a wheelchair)?: Total Help needed standing up from a chair using your arms (e.g., wheelchair or bedside chair)?: Total Help needed to walk in hospital room?: Total Help needed climbing 3-5 steps with a railing? : Total 6 Click Score: 9    End of Session Equipment Utilized During Treatment: Gait belt Activity Tolerance: Patient tolerated treatment well Patient left: in bed;with call bell/phone within reach;with bed alarm set;Other (comment)(bed in semi-cahir position) Nurse Communication: Mobility status PT Visit Diagnosis: Other abnormalities of gait and mobility  (R26.89);Muscle weakness (generalized) (M62.81)    Time: 1600-1630 PT Time Calculation (min) (ACUTE ONLY): 30 min   Charges:   PT Evaluation $PT Eval Moderate Complexity: 1 Mod PT Treatments $Therapeutic Activity: 8-22 mins        Roney Marion, PT  Acute Rehabilitation Services Pager [phone] Office [phone]   Colletta [place] 03/09/2020, 5:48 PM

## 2020-03-09 NOTE — Progress Notes (Signed)
Asked patient if he wears a CPAP at home, patient stated that he did not recall wearing anything at home that I was describing.  Patient does have Dementia and Alzheimer's and when I checked for notes, could not find much and did not see wear he had been fitted before tonight with a CPAP.  When I went back in to talk to the patient again, he was talking about [employer] with me and a Stage manager, so patient is confused on certain things.  Do not feel that he should wear one tonight or at lease while he is in a confused state.  Will continue to monitor patient.  Patient did not seem in any distress at this time.  Will alert RN and have her call me if any changes occur.

## 2020-03-09 NOTE — Progress Notes (Addendum)
PROGRESS NOTE    Tony Fox  ZDG:[id number] DOB: 07-19-1941 DOA: 03/05/2020 PCP: Willey Blade, MD   Brief Narrative:  Tony Fox is a 79 y.o. male with medical history significant of Alzheimer's dementia, BPH, GERD, hypertension, hyperlipidemia, OSA on CPAP, noninsulin-dependent type 2 diabetes, CKD stage III, right lower extremity DVT diagnosed in January 2021 presenting to the ED via EMS for evaluation of generalized weakness, fever, and altered mental status.  According to EMS, patient was difficult to arouse this morning and not acting normal.  They found him to be febrile and patient was complaining of some urinary symptoms. Patient is oriented to self only.  He is not sure why he is here.  He has no complaints.  Denies fevers, chills, headache, neck pain/stiffness, cough, shortness of breath, chest pain, nausea, vomiting, abdominal pain, diarrhea, dysuria, or urinary frequency/urgency.  No additional history could be obtained from him. In ED patient noted to be febrile with temperature 101.8 F.  Initially tachycardic and tachypneic, now resolved.  Not hypotensive.  Not hypoxic.  Labs showing no leukocytosis.  Lactic acid 3.3 >4.2.  Blood glucose 167.  BUN 26, creatinine 2.6.  Creatinine ranging between 1.9-2.9 a few months ago.  AST 243, ALT 313, alk phos 157, and T bili 3.9. Lipase normal.  No significant elevation of ammonia.  Blood culture x2 pending.  SARS-CoV-2 rapid antigen test negative, PCR test pending.  UA not suggestive of infection.  Urine culture pending. Hospitalist called to admit.  Assessment & Plan:   Principal Problem:   Sepsis ([place]) Active Problems:   OSA (obstructive sleep apnea)   Acute metabolic encephalopathy   Elevated LFTs   Type 2 diabetes mellitus (HCC)   Sepsis secondary to strep viridans bacteremia given cultures as below, POA Cannot rule out concurrent UTI given abnormal UA Patient initially febrile tachycardic and tachypneic on arrival but  now resolved with IV fluids and supportive care Patient initially did not initially meet sepsis criteria as there was no source, blood cultures resulted with strep viridans, cannot rule out concurrent UTI Lactic acidosis resolved with IV fluids Imaging including chest x-ray and CT abdomen pelvis unremarkable for any acute findings  Procalcitonin elevated at 1.57 at admission Broad-spectrum antibiotics including vancomycin, cefepime, and metronidazole at admission Continue vancomycin and cefepime only given species and sensitivities thus far Blood culture 1 out of 2, remains concern for contaminant and false positive source however repeat blood cultures will be ordered to ensure appropriate results and treatment.  Urine culture growing multiple species, unclear if dirty catch or abnormal UTI Likely de-escalate abx in the next 24-48h pending cultures  Echo pending given questionable strep bacteremia  Acute metabolic encephalopathy on baseline dementia, resolving:  Likely secondary to above Patient is not hypoglycemic.  No significant elevation of ammonia level.  CT head unremarkable for acute findings  Patient awake alert and oriented to self only, baseline dementia noted in chart, wife indicates he is very close to his baseline at this point PT OT to evaluate - patient walks with cane/walker at house  Acutely elevated LFTs:  Hepatic Function Latest Ref Rng & Units 03/09/2020 03/08/2020 03/07/2020  Total Protein 6.5 - 8.1 g/dL 5.5(L) 6.2(L) 5.5(L)  Albumin 3.5 - 5.0 g/dL 2.5(L) 2.5(L) 2.3(L)  AST 15 - 41 U/L 44(H) 36 50(H)  ALT 0 - 44 U/L 76(H) 91(H) 115(H)  Alk Phosphatase 38 - 126 U/L 80 94 91  Total Bilirubin 0.3 - 1.2 mg/dL 0.6 0.9 0.8  Bilirubin,  Direct 0.0 - 0.2 mg/dL - - -  Abdominal exam benign.  CT abdomen pelvis without acute finding.   Right upper quadrant ultrasound showing biliary sludge without cholelithiasis or cholecystitis. Showing evidence of hepatic steatosis.   Unclear  if elevated AST ALT are related to hemoconcentration and dehydration  Downtrending appropriately with IV fluids.  CKD stage IIIB:  Lab Results  Component Value Date   CREATININE 2.03 (H) 03/09/2020   CREATININE 1.93 (H) 03/08/2020   CREATININE 2.32 (H) 03/07/2020  Baseline appears to be around 1.9-2.9: quite labile over the past 6 months Stable despite IV fluids overnight, continue to follow, appears to be essentially at baseline at this point  Constipation:  CT showing significantly distended rectum with stool.  Dulcolax suppository ordered No BM per documentation - will advance regimen.  Alzheimer's dementia:  Per pharmacy med rec, patient is currently on donezepil only.   Will hold at this time given borderline QT prolongation on EKG as below  History of right lower extremity DVT: Continue Eliquis  QT prolongation on EKG:  Cardiac monitoring.  Avoid QT prolonging drugs/hold home donezepil.   Keep potassium above 4 and magnesium above 2.  Repeat EKG in a.m.  Hypertension:  Currently normotensive.  Hold antihypertensives at this time in the setting of infection, restart if patient becomes hypertensive.  Non-insulin-dependent type 2 diabetes: Check A1c.  Sliding scale insulin sensitive ACHS and CBG checks.  OSA: Continue CPAP at night  DVT prophylaxis: Eliquis Code Status: DNR per prior hospital records. Family Communication:  Wife updated over the phone Disposition Plan:  Pending clinical course, at this point discharge home per family wishes, possible need for discharge to SNF again pending clinical course and PT OT evaluation once patient is more awake alert and able to participate with physical therapy Consults called: None Admission status:  Inpatient, patient continues to require IV fluids, antibiotics in the setting of sepsis concerning for bacteremia.  Subjective: No acute issues or events overnight, review of systems somewhat limited given patient's mental  status but he denies any overt symptoms denies cough, sputum production, urinary hesitancy frequency urgency, diarrhea.  Objective: Vitals:   03/08/20 0901 03/08/20 1632 03/08/20 2111 03/09/20 0513  BP: 120/75 138/75 97/81 139/84  Pulse: 67 62 61 (!) 59  Resp: _0 Temp: 98.4 F (36.9 C) 98.8 F (37.1 C) 98.4 F (36.9 C) 98.1 F (36.7 C)  TempSrc: Oral Oral    SpO2: 99% 94% 99% 99%  Weight:   88.6 kg   Height:        Intake/Output Summary (Last 24 hours) at 03/09/2020 0733 Last data filed at 03/09/2020 0555 Gross per 24 hour  Intake 1661.19 ml  Output 1775 ml  Net -113.81 ml   Filed Weights   03/06/20 2023 03/07/20 2108 03/08/20 2111  Weight: 87.5 kg 88.6 kg 88.6 kg    Examination:  General:  Pleasantly resting in bed, No acute distress.  Alert to person only HEENT:  Normocephalic atraumatic.  Sclerae nonicteric, noninjected.  Extraocular movements intact bilaterally. Neck:  Without mass or deformity.  Trachea is midline. Lungs:  Clear to auscultate bilaterally without rhonchi, wheeze, or rales. Heart:  Regular rate and rhythm.  Without murmurs, rubs, or gallops. Abdomen:  Soft, nontender, nondistended.  Without guarding or rebound. Extremities: Without cyanosis, clubbing, edema, or obvious deformity. Vascular:  Dorsalis pedis and posterior tibial pulses palpable bilaterally. Skin:  Warm and dry, no erythema, no ulcerations.  Data Reviewed: I have  personally reviewed following labs and imaging studies  CBC: Recent Labs  Lab 03/05/20 1340 03/07/20 0449 03/08/20 0833 03/09/20 0504  WBC 8.6 3.6* 4.1 4.2  NEUTROABS 7.5  --   --   --   HGB 15.1 12.1* 13.0 12.2*  HCT 47.3 37.4* 41.0 37.4*  MCV 89.8 91.9 91.1 89.3  PLT 176 133* 154 086   Basic Metabolic Panel: Recent Labs  Lab 03/05/20 1340 03/06/20 0104 03/06/20 0322 03/07/20 0449 03/08/20 0833 03/09/20 0504  NA 142  --  142 140 138 139  K 4.1  --  4.1 4.1 4.1 4.3  CL 105  --  111 113* 111 112*    CO2 23  --  18* 18* 18* 17*  GLUCOSE 167*  --  115* 99 122* 98  BUN 26*  --  27* 25* 20 24*  CREATININE 2.66*  --  2.64* 2.32* 1.93* 2.03*  CALCIUM 9.3  --  8.5* 8.2* 8.4* 8.2*  MG  --  1.6*  --   --   --   --    GFR: Estimated Creatinine Clearance: 31.9 mL/min (A) (by C-G formula based on SCr of 2.03 mg/dL (H)). Liver Function Tests: Recent Labs  Lab 03/05/20 1340 03/06/20 0322 03/07/20 0449 03/08/20 0833 03/09/20 0504  AST 243* 112* 50* 36 44*  ALT 313* 202* 115* 91* 76*  ALKPHOS 157* 119 91 94 80  BILITOT 3.9* 2.7* 0.8 0.9 0.6  PROT 7.6 6.1* 5.5* 6.2* 5.5*  ALBUMIN 3.4* 2.8* 2.3* 2.5* 2.5*   Recent Labs  Lab 03/05/20 1604  LIPASE 35   Recent Labs  Lab 03/05/20 1604  AMMONIA 38*   Coagulation Profile: Recent Labs  Lab 03/05/20 1340  INR 1.1   Cardiac Enzymes: No results for input(s): CKTOTAL, CKMB, CKMBINDEX, TROPONINI in the last 168 hours. BNP (last 3 results) No results for input(s): PROBNP in the last 8760 hours. HbA1C: No results for input(s): HGBA1C in the last 72 hours. CBG: Recent Labs  Lab 03/08/20 0642 03/08/20 1127 03/08/20 1630 03/08/20 2111 03/09/20 0650  GLUCAP 86 92 107* 151* 89   Lipid Profile: No results for input(s): CHOL, HDL, LDLCALC, TRIG, CHOLHDL, LDLDIRECT in the last 72 hours. Thyroid Function Tests: No results for input(s): TSH, T4TOTAL, FREET4, T3FREE, THYROIDAB in the last 72 hours. Anemia Panel: No results for input(s): VITAMINB12, FOLATE, FERRITIN, TIBC, IRON, RETICCTPCT in the last 72 hours. Sepsis Labs: Recent Labs  Lab 03/05/20 1340 03/05/20 1604 03/06/20 0104 03/06/20 0740  PROCALCITON  --   --  1.57  --   LATICACIDVEN 3.3* 4.2* 3.1* 1.4    Recent Results (from the past 240 hour(s))  Culture, blood (Routine x 2)     Status: None (Preliminary result)   Collection Time: 03/05/20  1:40 PM   Specimen: BLOOD  Result Value Ref Range Status   Specimen Description BLOOD SITE NOT SPECIFIED  Final   Special  Requests   Final    BOTTLES DRAWN AEROBIC AND ANAEROBIC Blood Culture adequate volume   Culture   Final    NO GROWTH 3 DAYS Performed at [facility], [place] [address] [place],  [address]    Report Status PENDING  Incomplete  Culture, blood (Routine x 2)     Status: Abnormal   Collection Time: 03/05/20  4:04 PM   Specimen: BLOOD  Result Value Ref Range Status   Specimen Description BLOOD  Final   Special Requests   Final    BOTTLES  DRAWN AEROBIC AND ANAEROBIC Blood Culture adequate volume   Culture  Setup Time   Final    ANAEROBIC BOTTLE ONLY GRAM POSITIVE COCCI IN CHAINS CRITICAL RESULT CALLED TO, READ BACK BY AND VERIFIED WITH: J LEDFORD PHARMD 03/06/20 2357 JDW    Culture (A)  Final    VIRIDANS STREPTOCOCCUS THE SIGNIFICANCE OF ISOLATING THIS ORGANISM FROM A SINGLE SET OF BLOOD CULTURES WHEN MULTIPLE SETS ARE DRAWN IS UNCERTAIN. PLEASE NOTIFY THE MICROBIOLOGY DEPARTMENT WITHIN ONE WEEK IF SPECIATION AND SENSITIVITIES ARE REQUIRED. Performed at [facility], [place] [address], [place], [place] [address]    Report Status 03/08/2020 FINAL  Final  Blood Culture ID Panel (Reflexed)     Status: Abnormal   Collection Time: 03/05/20  4:04 PM  Result Value Ref Range Status   Enterococcus species NOT DETECTED NOT DETECTED Final   Listeria monocytogenes NOT DETECTED NOT DETECTED Final   Staphylococcus species NOT DETECTED NOT DETECTED Final   Staphylococcus aureus (BCID) NOT DETECTED NOT DETECTED Final   Streptococcus species DETECTED (A) NOT DETECTED Final    Comment: Not Enterococcus species, Streptococcus agalactiae, Streptococcus pyogenes, or Streptococcus pneumoniae. CRITICAL RESULT CALLED TO, READ BACK BY AND VERIFIED WITH: J LEDFORD PHARMD 03/06/20 2357 JDW    Streptococcus agalactiae NOT DETECTED NOT DETECTED Final   Streptococcus pneumoniae NOT DETECTED NOT DETECTED Final   Streptococcus pyogenes NOT DETECTED NOT DETECTED Final   Acinetobacter baumannii NOT  DETECTED NOT DETECTED Final   Enterobacteriaceae species NOT DETECTED NOT DETECTED Final   Enterobacter cloacae complex NOT DETECTED NOT DETECTED Final   Escherichia coli NOT DETECTED NOT DETECTED Final   Klebsiella oxytoca NOT DETECTED NOT DETECTED Final   Klebsiella pneumoniae NOT DETECTED NOT DETECTED Final   Proteus species NOT DETECTED NOT DETECTED Final   Serratia marcescens NOT DETECTED NOT DETECTED Final   Haemophilus influenzae NOT DETECTED NOT DETECTED Final   Neisseria meningitidis NOT DETECTED NOT DETECTED Final   Pseudomonas aeruginosa NOT DETECTED NOT DETECTED Final   Candida albicans NOT DETECTED NOT DETECTED Final   Candida glabrata NOT DETECTED NOT DETECTED Final   Candida krusei NOT DETECTED NOT DETECTED Final   Candida parapsilosis NOT DETECTED NOT DETECTED Final   Candida tropicalis NOT DETECTED NOT DETECTED Final    Comment: Performed at [facility], [place] [address], [place], [place] [address]  Urine culture     Status: Abnormal   Collection Time: 03/05/20  6:36 PM   Specimen: Urine, Clean Catch  Result Value Ref Range Status   Specimen Description URINE, CLEAN CATCH  Final   Special Requests   Final    NONE Performed at [facility], [place] [address], [place], [place] [address]    Culture MULTIPLE SPECIES PRESENT, SUGGEST RECOLLECTION (A)  Final   Report Status 03/07/2020 FINAL  Final  SARS CORONAVIRUS 2 (TAT 6-24 HRS) Nasopharyngeal Nasopharyngeal Swab     Status: None   Collection Time: 03/05/20  7:00 PM   Specimen: Nasopharyngeal Swab  Result Value Ref Range Status   SARS Coronavirus 2 NEGATIVE NEGATIVE Final    Comment: (NOTE) SARS-CoV-2 target nucleic acids are NOT DETECTED. The SARS-CoV-2 RNA is generally detectable in upper and lower respiratory specimens during the acute phase of infection. Negative results do not preclude SARS-CoV-2 infection, do not rule out co-infections with other pathogens, and should not be used as the sole basis  for treatment or other patient management decisions. Negative results must be combined with clinical observations,  patient history, and epidemiological information. The expected result is Negative. Fact Sheet for Patients: SugarRoll.be Fact Sheet for Healthcare Providers: https://www.woods-mathews.com/ This test is not yet approved or cleared by the [place] FDA and  has been authorized for detection and/or diagnosis of SARS-CoV-2 by FDA under an Emergency Use Authorization (EUA). This EUA will remain  in effect (meaning this test can be used) for the duration of the COVID-19 declaration under Section 56 4(b)(1) of the Act, 21 U.S.C. section 360bbb-3(b)(1), unless the authorization is terminated or revoked sooner. Performed at [facility], [place] [address], [place], [place] [address]          Radiology Studies: No results found.      Scheduled Meds: . apixaban  5 mg Oral BID  . aspirin EC  81 mg Oral Daily  . bisacodyl  10 mg Rectal Once  . Chlorhexidine Gluconate Cloth  6 each Topical Daily  . insulin aspart  0-5 Units Subcutaneous QHS  . insulin aspart  0-9 Units Subcutaneous TID WC   Continuous Infusions: . ceFEPime (MAXIPIME) IV 2 g (03/08/20 1711)  . metronidazole 500 mg (03/09/20 0554)  . vancomycin 1,000 mg (03/08/20 1841)     LOS: 4 days   Time spent: 29mn  Jackie Russman C Kenedee Molesky, DO [facility]  If 7PM-7AM, please contact night-coverage [email or web address]  03/09/2020, 7:33 AM

## 2020-03-09 NOTE — Progress Notes (Signed)
  Echocardiogram 2D Echocardiogram has been performed.  Tony Fox 03/09/2020, 2:05 PM

## 2020-03-10 LAB — COMPREHENSIVE METABOLIC PANEL
ALT: 78 U/L — ABNORMAL HIGH (ref 0–44)
AST: 69 U/L — ABNORMAL HIGH (ref 15–41)
Albumin: 2.8 g/dL — ABNORMAL LOW (ref 3.5–5.0)
Alkaline Phosphatase: 80 U/L (ref 38–126)
Anion gap: 8 (ref 5–15)
BUN: 19 mg/dL (ref 8–23)
CO2: 18 mmol/L — ABNORMAL LOW (ref 22–32)
Calcium: 8.5 mg/dL — ABNORMAL LOW (ref 8.9–10.3)
Chloride: 112 mmol/L — ABNORMAL HIGH (ref 98–111)
Creatinine, Ser: 1.88 mg/dL — ABNORMAL HIGH (ref 0.61–1.24)
GFR calc Af Amer: 38 mL/min — ABNORMAL LOW (ref >60)
GFR calc non Af Amer: 33 mL/min — ABNORMAL LOW (ref >60)
Glucose, Bld: 100 mg/dL — ABNORMAL HIGH (ref 70–99)
Potassium: 4.2 mmol/L (ref 3.5–5.1)
Sodium: 138 mmol/L (ref 135–145)
Total Bilirubin: 1.2 mg/dL (ref 0.3–1.2)
Total Protein: 6.2 g/dL — ABNORMAL LOW (ref 6.5–8.1)

## 2020-03-10 LAB — GLUCOSE, CAPILLARY
Glucose-Capillary: 84 mg/dL (ref 70–99)
Glucose-Capillary: 124 mg/dL — ABNORMAL HIGH (ref 70–99)
Glucose-Capillary: 132 mg/dL — ABNORMAL HIGH (ref 70–99)
Glucose-Capillary: 157 mg/dL — ABNORMAL HIGH (ref 70–99)

## 2020-03-10 LAB — CBC
HCT: 39.3 % (ref 39.0–52.0)
Hemoglobin: 12.7 g/dL — ABNORMAL LOW (ref 13.0–17.0)
MCH: 28.5 pg (ref 26.0–34.0)
MCHC: 32.3 g/dL (ref 30.0–36.0)
MCV: 88.3 fL (ref 80.0–100.0)
Platelets: 201 10*3/uL (ref 150–400)
RBC: 4.45 MIL/uL (ref 4.22–5.81)
RDW: 14.6 % (ref 11.5–15.5)
WBC: 4.8 10*3/uL (ref 4.0–10.5)
nRBC: 0 % (ref 0.0–0.2)

## 2020-03-10 LAB — CULTURE, BLOOD (ROUTINE X 2)
Culture: NO GROWTH
Special Requests: ADEQUATE

## 2020-03-10 NOTE — TOC Progression Note (Signed)
Transition of Care [facility]) - Progression Note    Patient Details  Name: Tony Fox MRN: [id number] Date of Birth: [birth year]  [facility] CM/SW Contact  Bartholomew Crews, RN Phone Number: [phone] 03/10/2020, 2:34 PM  Clinical Narrative:     Notified by MD of wife's desire to bring patient home. Spoke with daughter, Tony Fox, at the bedside, and provided NCM contact information. Tony Fox was able to provide information about DME - patient has a hospital bed that can allow him to stand up; he has a wheelchair; he has a RW; and he has a 3N1. Patient would benefit from a hoyer lift. Patient will need DME order for hoyer lift.   Patient has [facility] recently [facility]. Referral accepted for RN, PT, OT, Aide, and SW. Patient will need HH orders and Face to Face prior to discharge.   Wife pays for private caregivers to assist with care at needed times.   Patient will need ambulance transport at time of discharge.   TOC team following for transition needs.   Expected Discharge Plan: [place] Barriers to Discharge: Continued Medical Work up  Expected Discharge Plan and Services Expected Discharge Plan: [place] In-house Referral: Clinical Social Work Discharge Planning Services: CM Consult Post Acute Care Choice: Durable Medical Equipment Living arrangements for the past 2 months: Single Family Home                 DME Arranged: Other see comment(hoyer lift) DME Agency: [employer]       HH Arranged: RN, PT, OT, Nurse's Aide, Social Work [employer] Agency: [facility] [facility] [employer] Date [place]: 03/10/20 Time [place]: 1430 Representative spoke with at [place]: [place] ([place]) Interventions    Readmission Risk Interventions Readmission Risk Prevention Plan 03/09/2020  Transportation Screening Complete  Medication Review Press photographer)  Complete  SW Recovery Care/Counseling Consult Not Complete  Some recent data might be hidden

## 2020-03-10 NOTE — Progress Notes (Signed)
PROGRESS NOTE    Tony Fox  IRC:[id number] DOB: 31-May-1941 DOA: 03/05/2020 PCP: Willey Blade, MD   Brief Narrative:  Tony Fox is a 79 y.o. male with medical history significant of Alzheimer's dementia, BPH, GERD, hypertension, hyperlipidemia, OSA on CPAP, noninsulin-dependent type 2 diabetes, CKD stage III, right lower extremity DVT diagnosed in January 2021 presenting to the ED via EMS for evaluation of generalized weakness, fever, and altered mental status.  According to EMS, patient was difficult to arouse this morning and not acting normal.  They found him to be febrile and patient was complaining of some urinary symptoms. Patient is oriented to self only.  He is not sure why he is here.  He has no complaints.  Denies fevers, chills, headache, neck pain/stiffness, cough, shortness of breath, chest pain, nausea, vomiting, abdominal pain, diarrhea, dysuria, or urinary frequency/urgency.  No additional history could be obtained from him. In ED patient noted to be febrile with temperature 101.8 F.  Initially tachycardic and tachypneic, now resolved.  Not hypotensive.  Not hypoxic.  Labs showing no leukocytosis.  Lactic acid 3.3 >4.2.  Blood glucose 167.  BUN 26, creatinine 2.6.  Creatinine ranging between 1.9-2.9 a few months ago.  AST 243, ALT 313, alk phos 157, and T bili 3.9. Lipase normal.  No significant elevation of ammonia.  Blood culture x2 pending.  SARS-CoV-2 rapid antigen test negative, PCR test pending.  UA not suggestive of infection.  Urine culture pending. Hospitalist called to admit.  Assessment & Plan:   Principal Problem:   Sepsis ([place]) Active Problems:   OSA (obstructive sleep apnea)   Acute metabolic encephalopathy   Elevated LFTs   Type 2 diabetes mellitus (HCC)   Sepsis secondary to presumed UTI, POA Likely contaminant given strep viridans blood culture positive 1 out of 4  Patient initially febrile tachycardic and tachypneic on arrival but now  resolved with IV fluids and supportive care Patient initially did not initially meet sepsis criteria as there was no source, given abnormal UA likely UTI at admission, blood culture 1 out of 4 positive indicates likely contaminant, echocardiogram negative Lactic acidosis resolved with IV fluids Imaging including chest x-ray and CT abdomen pelvis unremarkable for any acute findings  Procalcitonin elevated at 1.57 at admission Broad-spectrum antibiotics including vancomycin, cefepime, and metronidazole at admission Patient has completed 5 days of antibiotics, will discontinue and follow clinically Blood culture 1 out of 4 likely contamination given discussion with ID and strep viridans identification  Urine culture growing multiple species, unclear if dirty catch or abnormal UTI -has completed coverage as above for most common organisms Discontinue antibiotics as above after completing 5 days of treatment Echocardiogram unremarkable for vegetation or valvular abnormalities  Acute metabolic encephalopathy on baseline dementia, resolved:  Likely secondary to above Patient is not hypoglycemic.  No significant elevation of ammonia level.  CT head unremarkable for acute findings  Patient awake alert and oriented to self only, baseline dementia noted in chart, wife indicates he is very close to his baseline at this point PT OT to evaluate - patient walks with cane/walker at house  Ambulatory dysfunction, likely acute on chronic POA Likely in the setting of illness as above Discussed with wife, patient had some ambulatory dysfunction requiring placement at SNF previously Unclear if patient has issues with balance or strength or just simply has difficulty following commands Appreciate PT OT evaluation and recommendations, family requesting that we discharge patient home -he did not do well at SNF previously  Acutely elevated LFTs, improving generally, POA:  Hepatic Function Latest Ref Rng & Units  03/09/2020 03/08/2020 03/07/2020  Total Protein 6.5 - 8.1 g/dL 5.5(L) 6.2(L) 5.5(L)  Albumin 3.5 - 5.0 g/dL 2.5(L) 2.5(L) 2.3(L)  AST 15 - 41 U/L 44(H) 36 50(H)  ALT 0 - 44 U/L 76(H) 91(H) 115(H)  Alk Phosphatase 38 - 126 U/L 80 94 91  Total Bilirubin 0.3 - 1.2 mg/dL 0.6 0.9 0.8  Bilirubin, Direct 0.0 - 0.2 mg/dL - - -  Abdominal exam benign.  CT abdomen pelvis without acute finding.   Right upper quadrant ultrasound showing biliary sludge without cholelithiasis or cholecystitis. Showing evidence of hepatic steatosis.   Unclear if elevated AST ALT are related to hemoconcentration and dehydration  Downtrending appropriately with IV fluids.  CKD stage IIIB:  Lab Results  Component Value Date   CREATININE 2.03 (H) 03/09/2020   CREATININE 1.93 (H) 03/08/2020   CREATININE 2.32 (H) 03/07/2020  Baseline appears to be around 1.9-2.9: quite labile over the past 6 months Stable despite IV fluids overnight, continue to follow, appears to be essentially at baseline at this point  Constipation:  CT showing significantly distended rectum with stool.  Dulcolax suppository ordered No BM per documentation - will advance regimen.  Alzheimer's dementia:  Per pharmacy med rec, patient is currently on donezepil only.   Will hold at this time given borderline QT prolongation on EKG as below  History of right lower extremity DVT: Continue Eliquis  QT prolongation on EKG:  Cardiac monitoring.  Avoid QT prolonging drugs/hold home donezepil.   Keep potassium above 4 and magnesium above 2.  Repeat EKG in a.m.  Hypertension:  Currently normotensive.  Hold antihypertensives at this time in the setting of infection, restart if patient becomes hypertensive.  Non-insulin-dependent type 2 diabetes: Check A1c.  Sliding scale insulin sensitive ACHS and CBG checks.  OSA: Continue CPAP at night - Sleep study positive as far back as 2008 per chart review  DVT prophylaxis: Eliquis Code Status: DNR per  prior hospital records. Family Communication:  Wife updated over the phone Disposition Plan:  Pending clinical course, PT currently recommending SNF, patient's wife and family indicate he did not tolerate SNF very well due to his advanced dementia and they would prefer he be discharged back home, will follow with PT likely the next 24 to 48 hours will have a better understanding of patient's ambulatory dysfunction and if he will be able to safely discharge home as well as what medical equipment he may require. Consults called: None Admission status:  Inpatient, patient continues to require close monitoring in the setting of previous sepsis criteria, antibiotics held, follow 24 hours for fever or clinical symptoms.  PT continues to evaluate for safe disposition as he is unable to discharge to SNF per family discussion.  Subjective: No acute issues or events overnight, CPAP was not placed last night due to confusion about patient's diagnosis and mental status, his baseline mental status is demented oriented to person only.  Patient's review of systems remain remarkably limited, he declines any nausea, vomiting, diarrhea, constipation, headache, fevers, chills.  Wife was updated over the phone as above.   Objective: Vitals:   03/09/20 0838 03/09/20 1733 03/09/20 2035 03/10/20 0525  BP: 127/74 (!) 146/73 (!) 157/83 (!) 154/82  Pulse: 71 60 (!) 58 (!) 55  Resp: _0 Temp: 98.4 F (36.9 C) 98.6 F (37 C) 97.7 F (36.5 C) 98.2 F (36.8 C)  TempSrc: Oral  Oral Oral Oral  SpO2: 99% 98% 100% 99%  Weight:      Height:        Intake/Output Summary (Last 24 hours) at 03/10/2020 0714 Last data filed at 03/09/2020 2306 Gross per 24 hour  Intake 360 ml  Output 1325 ml  Net -965 ml   Filed Weights   03/06/20 2023 03/07/20 2108 03/08/20 2111  Weight: 87.5 kg 88.6 kg 88.6 kg    Examination:  General:  Pleasantly resting in bed, No acute distress.  Alert to person only, confabulates and  danders throughout conversation but answers simple yes no questions appropriately for the most part HEENT:  Normocephalic atraumatic.  Sclerae nonicteric, noninjected.  Extraocular movements intact bilaterally. Neck:  Without mass or deformity.  Trachea is midline. Lungs:  Clear to auscultate bilaterally without rhonchi, wheeze, or rales. Heart:  Regular rate and rhythm.  Without murmurs, rubs, or gallops. Abdomen:  Soft, nontender, nondistended.  Without guarding or rebound. Extremities: Without cyanosis, clubbing, edema, or obvious deformity. Vascular:  Dorsalis pedis and posterior tibial pulses palpable bilaterally. Skin:  Warm and dry, no erythema, no ulcerations.  Data Reviewed: I have personally reviewed following labs and imaging studies  CBC: Recent Labs  Lab 03/05/20 1340 03/07/20 0449 03/08/20 0833 03/09/20 0504  WBC 8.6 3.6* 4.1 4.2  NEUTROABS 7.5  --   --   --   HGB 15.1 12.1* 13.0 12.2*  HCT 47.3 37.4* 41.0 37.4*  MCV 89.8 91.9 91.1 89.3  PLT 176 133* 154 482   Basic Metabolic Panel: Recent Labs  Lab 03/05/20 1340 03/06/20 0104 03/06/20 0322 03/07/20 0449 03/08/20 0833 03/09/20 0504  NA 142  --  142 140 138 139  K 4.1  --  4.1 4.1 4.1 4.3  CL 105  --  111 113* 111 112*  CO2 23  --  18* 18* 18* 17*  GLUCOSE 167*  --  115* 99 122* 98  BUN 26*  --  27* 25* 20 24*  CREATININE 2.66*  --  2.64* 2.32* 1.93* 2.03*  CALCIUM 9.3  --  8.5* 8.2* 8.4* 8.2*  MG  --  1.6*  --   --   --   --    GFR: Estimated Creatinine Clearance: 31.9 mL/min (A) (by C-G formula based on SCr of 2.03 mg/dL (H)). Liver Function Tests: Recent Labs  Lab 03/05/20 1340 03/06/20 0322 03/07/20 0449 03/08/20 0833 03/09/20 0504  AST 243* 112* 50* 36 44*  ALT 313* 202* 115* 91* 76*  ALKPHOS 157* 119 91 94 80  BILITOT 3.9* 2.7* 0.8 0.9 0.6  PROT 7.6 6.1* 5.5* 6.2* 5.5*  ALBUMIN 3.4* 2.8* 2.3* 2.5* 2.5*   Recent Labs  Lab 03/05/20 1604  LIPASE 35   Recent Labs  Lab 03/05/20 1604    AMMONIA 38*   Coagulation Profile: Recent Labs  Lab 03/05/20 1340  INR 1.1   Cardiac Enzymes: No results for input(s): CKTOTAL, CKMB, CKMBINDEX, TROPONINI in the last 168 hours. BNP (last 3 results) No results for input(s): PROBNP in the last 8760 hours. HbA1C: No results for input(s): HGBA1C in the last 72 hours. CBG: Recent Labs  Lab 03/09/20 0650 03/09/20 1120 03/09/20 1707 03/09/20 2031 03/10/20 0651  GLUCAP 89 91 88 122* 84   Lipid Profile: No results for input(s): CHOL, HDL, LDLCALC, TRIG, CHOLHDL, LDLDIRECT in the last 72 hours. Thyroid Function Tests: No results for input(s): TSH, T4TOTAL, FREET4, T3FREE, THYROIDAB in the last 72 hours. Anemia Panel: No results for  input(s): VITAMINB12, FOLATE, FERRITIN, TIBC, IRON, RETICCTPCT in the last 72 hours. Sepsis Labs: Recent Labs  Lab 03/05/20 1340 03/05/20 1604 03/06/20 0104 03/06/20 0740  PROCALCITON  --   --  1.57  --   LATICACIDVEN 3.3* 4.2* 3.1* 1.4    Recent Results (from the past 240 hour(s))  Culture, blood (Routine x 2)     Status: None   Collection Time: 03/05/20  1:40 PM   Specimen: BLOOD  Result Value Ref Range Status   Specimen Description BLOOD SITE NOT SPECIFIED  Final   Special Requests   Final    BOTTLES DRAWN AEROBIC AND ANAEROBIC Blood Culture adequate volume   Culture   Final    NO GROWTH 5 DAYS Performed at [facility], [place] [address] [place], [place] [address]    Report Status 03/10/2020 FINAL  Final  Culture, blood (Routine x 2)     Status: Abnormal   Collection Time: 03/05/20  4:04 PM   Specimen: BLOOD  Result Value Ref Range Status   Specimen Description BLOOD  Final   Special Requests   Final    BOTTLES DRAWN AEROBIC AND ANAEROBIC Blood Culture adequate volume   Culture  Setup Time   Final    ANAEROBIC BOTTLE ONLY GRAM POSITIVE COCCI IN CHAINS CRITICAL RESULT CALLED TO, READ BACK BY AND VERIFIED WITH: J LEDFORD PHARMD 03/06/20 2357 JDW    Culture (A)  Final     VIRIDANS STREPTOCOCCUS THE SIGNIFICANCE OF ISOLATING THIS ORGANISM FROM A SINGLE SET OF BLOOD CULTURES WHEN MULTIPLE SETS ARE DRAWN IS UNCERTAIN. PLEASE NOTIFY THE MICROBIOLOGY DEPARTMENT WITHIN ONE WEEK IF SPECIATION AND SENSITIVITIES ARE REQUIRED. Performed at [facility], [place] [address], [place], [place] [address]    Report Status 03/08/2020 FINAL  Final  Blood Culture ID Panel (Reflexed)     Status: Abnormal   Collection Time: 03/05/20  4:04 PM  Result Value Ref Range Status   Enterococcus species NOT DETECTED NOT DETECTED Final   Listeria monocytogenes NOT DETECTED NOT DETECTED Final   Staphylococcus species NOT DETECTED NOT DETECTED Final   Staphylococcus aureus (BCID) NOT DETECTED NOT DETECTED Final   Streptococcus species DETECTED (A) NOT DETECTED Final    Comment: Not Enterococcus species, Streptococcus agalactiae, Streptococcus pyogenes, or Streptococcus pneumoniae. CRITICAL RESULT CALLED TO, READ BACK BY AND VERIFIED WITH: J LEDFORD PHARMD 03/06/20 2357 JDW    Streptococcus agalactiae NOT DETECTED NOT DETECTED Final   Streptococcus pneumoniae NOT DETECTED NOT DETECTED Final   Streptococcus pyogenes NOT DETECTED NOT DETECTED Final   Acinetobacter baumannii NOT DETECTED NOT DETECTED Final   Enterobacteriaceae species NOT DETECTED NOT DETECTED Final   Enterobacter cloacae complex NOT DETECTED NOT DETECTED Final   Escherichia coli NOT DETECTED NOT DETECTED Final   Klebsiella oxytoca NOT DETECTED NOT DETECTED Final   Klebsiella pneumoniae NOT DETECTED NOT DETECTED Final   Proteus species NOT DETECTED NOT DETECTED Final   Serratia marcescens NOT DETECTED NOT DETECTED Final   Haemophilus influenzae NOT DETECTED NOT DETECTED Final   Neisseria meningitidis NOT DETECTED NOT DETECTED Final   Pseudomonas aeruginosa NOT DETECTED NOT DETECTED Final   Candida albicans NOT DETECTED NOT DETECTED Final   Candida glabrata NOT DETECTED NOT DETECTED Final   Candida krusei NOT DETECTED  NOT DETECTED Final   Candida parapsilosis NOT DETECTED NOT DETECTED Final   Candida tropicalis NOT DETECTED NOT DETECTED Final    Comment: Performed at [facility], [place],  [place] [address]  Urine culture     Status: Abnormal   Collection Time: 03/05/20  6:36 PM   Specimen: Urine, Clean Catch  Result Value Ref Range Status   Specimen Description URINE, CLEAN CATCH  Final   Special Requests   Final    NONE Performed at [facility], [place] [address] [place] [address], [place], [place] [address]    Culture MULTIPLE SPECIES PRESENT, SUGGEST RECOLLECTION (A)  Final   Report Status 03/07/2020 FINAL  Final  SARS CORONAVIRUS 2 (TAT 6-24 HRS) Nasopharyngeal Nasopharyngeal Swab     Status: None   Collection Time: 03/05/20  7:00 PM   Specimen: Nasopharyngeal Swab  Result Value Ref Range Status   SARS Coronavirus 2 NEGATIVE NEGATIVE Final    Comment: (NOTE) SARS-CoV-2 target nucleic acids are NOT DETECTED. The SARS-CoV-2 RNA is generally detectable in upper and lower respiratory specimens during the acute phase of infection. Negative results do not preclude SARS-CoV-2 infection, do not rule out co-infections with other pathogens, and should not be used as the sole basis for treatment or other patient management decisions. Negative results must be combined with clinical observations, patient history, and epidemiological information. The expected result is Negative. Fact Sheet for Patients: SugarRoll.be Fact Sheet for Healthcare Providers: https://www.woods-mathews.com/ This test is not yet approved or cleared by the [place] FDA and  has been authorized for detection and/or diagnosis of SARS-CoV-2 by FDA under an Emergency Use Authorization (EUA). This EUA will remain  in effect (meaning this test can be used) for the duration of the COVID-19 declaration under Section 56 4(b)(1) of the Act, 21 U.S.C. section 360bbb-3(b)(1), unless the  authorization is terminated or revoked sooner. Performed at [facility], [place] [address], [place], [place] [address]   Culture, blood (routine x 2)     Status: None (Preliminary result)   Collection Time: 03/08/20  8:33 AM   Specimen: BLOOD  Result Value Ref Range Status   Specimen Description BLOOD RIGHT ANTECUBITAL  Final   Special Requests   Final    BOTTLES DRAWN AEROBIC ONLY Blood Culture adequate volume   Culture   Final    NO GROWTH 2 DAYS Performed at [facility], [place] [address] [place], [place] [address]    Report Status PENDING  Incomplete  Culture, blood (routine x 2)     Status: None (Preliminary result)   Collection Time: 03/08/20  8:39 AM   Specimen: BLOOD LEFT HAND  Result Value Ref Range Status   Specimen Description BLOOD LEFT HAND  Final   Special Requests   Final    BOTTLES DRAWN AEROBIC ONLY Blood Culture results may not be optimal due to an inadequate volume of blood received in culture bottles   Culture   Final    NO GROWTH 2 DAYS Performed at [facility], [place] [address], [place], [place] [address]    Report Status PENDING  Incomplete         Radiology Studies: ECHOCARDIOGRAM COMPLETE  Result Date: 03/09/2020    ECHOCARDIOGRAM REPORT   Patient Name:   TEEJAY MEADER Date of Exam: 03/09/2020 Medical Rec #:  [id number]        Height:       72.0 in Accession #:    [id number]       Weight:       195.3 lb Date of Birth:  05-May-1941        BSA:  2.109 m Patient Age:    1 years         BP:           127/74 mmHg Patient Gender: M                HR:           71 bpm. Exam Location:  Inpatient Procedure: 2D Echo, Cardiac Doppler and Color Doppler Indications:    Bacteremia 790.7/R78.81  History:        Patient has prior history of Echocardiogram examinations, most                 recent 11/25/2019. Risk Factors:Sleep Apnea, Diabetes and                 Dyslipidemia.  Sonographer:    Clayton Lefort RDCS (AE) Referring Phys: [id number] [place]  Sonographer Comments: Limited abillity to position patient. IMPRESSIONS  1. Left ventricular ejection fraction, by estimation, is 60 to 65%. The left ventricle has normal function. The left ventricle has no regional wall motion abnormalities. Left ventricular diastolic parameters are consistent with Grade I diastolic dysfunction (impaired relaxation).  2. Right ventricular systolic function is mildly reduced. The right ventricular size is moderately enlarged.  3. The mitral valve is normal in structure. Trivial mitral valve regurgitation. No evidence of mitral stenosis.  4. The tricuspid valve is degenerative. Mild TR.  5. The aortic valve is abnormal. Aortic valve regurgitation is not visualized. Doppler alignment suboptimal for assessment of aortic stenosis. Comparison(s): A prior study was performed on 11/25/19. Prior images reviewed side by side. Conclusion(s)/Recommendation(s): Cannot exclude small independently mobile echodensity on the ventricular aspect of the tricuspid valve, however this most likely represents degenerative change of the tricuspid valve given mild TR and similar valve appearance on prior echocardiogram. FINDINGS  Left Ventricle: Left ventricular ejection fraction, by estimation, is 60 to 65%. The left ventricle has normal function. The left ventricle has no regional wall motion abnormalities. The left ventricular internal cavity size was normal in size. There is  no left ventricular hypertrophy. Left ventricular diastolic parameters are consistent with Grade I diastolic dysfunction (impaired relaxation). Right Ventricle: The right ventricular size is moderately enlarged. No increase in right ventricular wall thickness. Right ventricular systolic function is mildly reduced. Left Atrium: Left atrial size was normal in size. Right Atrium: Right atrial size was not well visualized. Pericardium: There is no evidence of pericardial effusion. Mitral Valve: The mitral valve is normal in  structure. Normal mobility of the mitral valve leaflets. Trivial mitral valve regurgitation. No evidence of mitral valve stenosis. MV peak gradient, 3.5 mmHg. The mean mitral valve gradient is 1.0 mmHg. Tricuspid Valve: The tricuspid valve is degenerative in appearance. Tricuspid valve regurgitation is mild . No evidence of tricuspid stenosis. Aortic Valve: The aortic valve is abnormal. Aortic valve regurgitation is not visualized. Doppler alignment suboptimal for assessment of aortic stenosis. There is moderate calcification of the aortic valve. Aortic valve mean gradient measures 4.0 mmHg. Aortic valve peak gradient measures 8.1 mmHg. Aortic valve area, by VTI measures 2.07 cm. Pulmonic Valve: The pulmonic valve was not well visualized. Pulmonic valve regurgitation is trivial. No evidence of pulmonic stenosis. Aorta: Asc Ao is 37 mm, which may be within normal limits for age when indexed to BSA. The aortic root is normal in size and structure and the ascending aorta was not well visualized. Venous: The inferior vena cava was not well visualized. IAS/Shunts: The interatrial  septum was not well visualized.  LEFT VENTRICLE PLAX 2D LVIDd:         4.30 cm  Diastology LVIDs:         2.70 cm  LV e' lateral:   7.83 cm/s LV PW:         1.30 cm  LV E/e' lateral: 6.7 LV IVS:        1.53 cm  LV e' medial:    3.81 cm/s LVOT diam:     2.20 cm  LV E/e' medial:  13.7 LV SV:         64 LV SV Index:   30 LVOT Area:     3.80 cm  RIGHT VENTRICLE RV Mid diam:    3.16 cm RV S prime:     19.10 cm/s TAPSE (M-mode): 2.6 cm LEFT ATRIUM             Index       RIGHT ATRIUM           Index LA diam:        3.90 cm 1.85 cm/m  RA Area:     14.70 cm LA Vol (A2C):   49.7 ml 23.56 ml/m RA Volume:   30.60 ml  14.51 ml/m LA Vol (A4C):   50.6 ml 23.99 ml/m LA Biplane Vol: 50.4 ml 23.89 ml/m  AORTIC VALVE AV Area (Vmax):    1.71 cm AV Area (Vmean):   1.85 cm AV Area (VTI):     2.07 cm AV Vmax:           142.00 cm/s AV Vmean:          95.100  cm/s AV VTI:            0.311 m AV Peak Grad:      8.1 mmHg AV Mean Grad:      4.0 mmHg LVOT Vmax:         64.00 cm/s LVOT Vmean:        46.200 cm/s LVOT VTI:          0.169 m LVOT/AV VTI ratio: 0.54  AORTA Ao Root diam: 3.70 cm MITRAL VALVE               TRICUSPID VALVE MV Area (PHT): 2.29 cm    TR Peak grad:   26.4 mmHg MV Peak grad:  3.5 mmHg    TR Vmax:        257.00 cm/s MV Mean grad:  1.0 mmHg MV Vmax:       0.94 m/s    SHUNTS MV Vmean:      41.4 cm/s   Systemic VTI:  0.17 m MV Decel Time: 331 msec    Systemic Diam: 2.20 cm MV E velocity: 52.30 cm/s MV A velocity: 78.00 cm/s MV E/A ratio:  0.67 Cherlynn Kaiser MD Electronically signed by Cherlynn Kaiser MD Signature Date/Time: 03/09/2020/3:44:56 PM    Final         Scheduled Meds: . apixaban  5 mg Oral BID  . aspirin EC  81 mg Oral Daily  . bisacodyl  10 mg Rectal Once  . Chlorhexidine Gluconate Cloth  6 each Topical Daily  . insulin aspart  0-5 Units Subcutaneous QHS  . insulin aspart  0-9 Units Subcutaneous TID WC   Continuous Infusions: . ceFEPime (MAXIPIME) IV 2 g (03/09/20 1742)  . metronidazole 500 mg (03/10/20 0604)  . vancomycin 1,000 mg (03/09/20 1823)     LOS: 5 days  Time spent: 80mn  Deshon Koslowski C Earsie Humm, DO [facility]  If 7PM-7AM, please contact night-coverage [email or web address]  03/10/2020, 7:14 AM

## 2020-03-11 DIAGNOSIS — G4733 Obstructive sleep apnea (adult) (pediatric): Secondary | ICD-10-CM

## 2020-03-11 LAB — CBC
HCT: 37.3 % — ABNORMAL LOW (ref 39.0–52.0)
Hemoglobin: 12.1 g/dL — ABNORMAL LOW (ref 13.0–17.0)
MCH: 28.6 pg (ref 26.0–34.0)
MCHC: 32.4 g/dL (ref 30.0–36.0)
MCV: 88.2 fL (ref 80.0–100.0)
Platelets: 199 10*3/uL (ref 150–400)
RBC: 4.23 MIL/uL (ref 4.22–5.81)
RDW: 14.9 % (ref 11.5–15.5)
WBC: 6.4 10*3/uL (ref 4.0–10.5)
nRBC: 0 % (ref 0.0–0.2)

## 2020-03-11 LAB — COMPREHENSIVE METABOLIC PANEL
ALT: 69 U/L — ABNORMAL HIGH (ref 0–44)
AST: 55 U/L — ABNORMAL HIGH (ref 15–41)
Albumin: 2.6 g/dL — ABNORMAL LOW (ref 3.5–5.0)
Alkaline Phosphatase: 67 U/L (ref 38–126)
Anion gap: 14 (ref 5–15)
BUN: 22 mg/dL (ref 8–23)
CO2: 12 mmol/L — ABNORMAL LOW (ref 22–32)
Calcium: 8.5 mg/dL — ABNORMAL LOW (ref 8.9–10.3)
Chloride: 110 mmol/L (ref 98–111)
Creatinine, Ser: 1.93 mg/dL — ABNORMAL HIGH (ref 0.61–1.24)
GFR calc Af Amer: 37 mL/min — ABNORMAL LOW (ref >60)
GFR calc non Af Amer: 32 mL/min — ABNORMAL LOW (ref >60)
Glucose, Bld: 117 mg/dL — ABNORMAL HIGH (ref 70–99)
Potassium: 4.5 mmol/L (ref 3.5–5.1)
Sodium: 136 mmol/L (ref 135–145)
Total Bilirubin: 1 mg/dL (ref 0.3–1.2)
Total Protein: 5.8 g/dL — ABNORMAL LOW (ref 6.5–8.1)

## 2020-03-11 LAB — GLUCOSE, CAPILLARY
Glucose-Capillary: 79 mg/dL (ref 70–99)
Glucose-Capillary: 110 mg/dL — ABNORMAL HIGH (ref 70–99)
Glucose-Capillary: 106 mg/dL — ABNORMAL HIGH (ref 70–99)
Glucose-Capillary: 157 mg/dL — ABNORMAL HIGH (ref 70–99)

## 2020-03-11 NOTE — Progress Notes (Signed)
PROGRESS NOTE    Tony Fox  QVZ:[id number] DOB: 1941/04/10 DOA: 03/05/2020 PCP: Willey Blade, MD   Brief Narrative:  Tony Fox is a 79 y.o. male with medical history significant of Alzheimer's dementia, BPH, GERD, hypertension, hyperlipidemia, OSA on CPAP, noninsulin-dependent type 2 diabetes, CKD stage III, right lower extremity DVT diagnosed in January 2021 presenting to the ED via EMS for evaluation of generalized weakness, fever, and altered mental status.  According to EMS, patient was difficult to arouse this morning and not acting normal.  They found him to be febrile and patient was complaining of some urinary symptoms. Patient is oriented to self only.  He is not sure why he is here.  He has no complaints.  Denies fevers, chills, headache, neck pain/stiffness, cough, shortness of breath, chest pain, nausea, vomiting, abdominal pain, diarrhea, dysuria, or urinary frequency/urgency.  No additional history could be obtained from him. In ED patient noted to be febrile with temperature 101.8 F.  Initially tachycardic and tachypneic, now resolved.  Not hypotensive.  Not hypoxic.  Labs showing no leukocytosis.  Lactic acid 3.3 >4.2.  Blood glucose 167.  BUN 26, creatinine 2.6.  Creatinine ranging between 1.9-2.9 a few months ago.  AST 243, ALT 313, alk phos 157, and T bili 3.9. Lipase normal.  No significant elevation of ammonia.  Blood culture x2 pending.  SARS-CoV-2 rapid antigen test negative, PCR test pending.  UA not suggestive of infection.  Urine culture pending. Hospitalist called to admit.  Assessment & Plan:   Principal Problem:   Sepsis ([place]) Active Problems:   OSA (obstructive sleep apnea)   Acute metabolic encephalopathy   Elevated LFTs   Type 2 diabetes mellitus (HCC)   Sepsis secondary to presumed UTI, POA Likely contaminant given strep viridans blood culture positive 1 out of 4  - Patient initially febrile tachycardic and tachypneic on arrival but now  resolved with IV fluids and supportive care - did not initially meet sepsis criteria as there was no source, given abnormal UA likely UTI at admission, blood culture 1 out of 4 positive indicates likely contaminant, echocardiogram negative - Imaging including chest x-ray and CT abdomen pelvis unremarkable for any acute findings  - Procalcitonin elevated at 1.57 at admission - Broad-spectrum antibiotics including vancomycin, cefepime, and metronidazole at admission -  nowcompleted 5 days of antibiotics, will discontinue and follow clinically - Blood culture 1 out of 4 likely contamination given discussion with ID and strep viridans identification  - Urine culture growing multiple species, unclear if dirty catch or abnormal UTI -has completed coverage as above for most common organisms - Discontinue antibiotics as above after completing 5 days of treatment -Continue to monitor for additional 24 hours to ensure no recurrent symptoms or fevers.  Subsequent disposition likely home with home health per discussion with you on his wife.  Acute metabolic encephalopathy on baseline dementia, resolved:  - Likely secondary to above - CT head unremarkable for acute findings  - Patient awake alert and oriented to self only, baseline dementia noted in chart, wife indicates he is essentially at baseline at this point - PT OT to evaluate - patient walks with cane/walker at house  Ambulatory dysfunction, likely acute on chronic POA Likely in the setting of illness as above Discussed with wife, patient had some ambulatory dysfunction requiring placement at SNF previously Unclear if patient has issues with balance or strength or just simply has difficulty following commands Appreciate PT OT evaluation and recommendations, family requesting that  we discharge patient home -he did not do well at SNF previously given advanced dementia   Acutely elevated LFTs, improving generally, POA:  Hepatic Function Latest Ref Rng  & Units 03/11/2020 03/10/2020 03/09/2020  Total Protein 6.5 - 8.1 g/dL 5.8(L) 6.2(L) 5.5(L)  Albumin 3.5 - 5.0 g/dL 2.6(L) 2.8(L) 2.5(L)  AST 15 - 41 U/L 55(H) 69(H) 44(H)  ALT 0 - 44 U/L 69(H) 78(H) 76(H)  Alk Phosphatase 38 - 126 U/L 67 80 80  Total Bilirubin 0.3 - 1.2 mg/dL 1.0 1.2 0.6  Bilirubin, Direct 0.0 - 0.2 mg/dL - - -  Abdominal exam benign.  CT abdomen pelvis without acute finding.   Right upper quadrant ultrasound showing biliary sludge without cholelithiasis or cholecystitis. Showing evidence of hepatic steatosis.   Unclear if elevated AST ALT are related to hemoconcentration and dehydration  Downtrending appropriately with IV fluids.  CKD stage IIIB:  Lab Results  Component Value Date   CREATININE 1.93 (H) 03/11/2020   CREATININE 1.88 (H) 03/10/2020   CREATININE 2.03 (H) 03/09/2020  Baseline appears to be around 1.9-2.9: quite labile over the past 6 months Stable despite IV fluids overnight, continue to follow, appears to be essentially at baseline at this point  Constipation:  CT showing significantly distended rectum with stool.  Dulcolax suppository ordered No BM per documentation - will advance regimen.  Alzheimer's dementia:  Per pharmacy med rec, patient is currently on donezepil only.   Will hold at this time given borderline QT prolongation on EKG as below  History of right lower extremity DVT: Continue Eliquis  QT prolongation on EKG:  Cardiac monitoring.  Avoid QT prolonging drugs/hold home donezepil.   Keep potassium above 4 and magnesium above 2.  Repeat EKG in a.m.  Hypertension:  Currently normotensive.  Hold antihypertensives at this time in the setting of infection, restart if patient becomes hypertensive.  Non-insulin-dependent type 2 diabetes: Check A1c.  Sliding scale insulin sensitive ACHS and CBG checks.  OSA: Continue CPAP at night - Sleep study positive as far back as 2008 per chart review  DVT prophylaxis: Eliquis Code Status:  DNR per prior hospital records. Family Communication:  Wife updated over the phone Disposition Plan:  Pending clinical course, PT currently recommending SNF, patient's wife and family indicate he did not tolerate SNF very well due to his advanced dementia and they would prefer he be discharged back home.  Pending further clinical evaluation as above for resolved infection likely discharge home in the next 24 to 48 hours with home health and will discuss with family and case management need for DME to ensure safe transition home. Consults called: None Admission status:  Inpatient, patient continues to require close monitoring in the setting of previous sepsis criteria, antibiotics held, follow 24 hours for fever or clinical symptoms.  PT continues to evaluate for safe disposition as he is unable to discharge to SNF per family discussion.  Subjective: No acute issues or events overnight, patient otherwise denies any shortness of breath, chest pain, nausea, vomiting, diarrhea, constipation, headache, fevers, chills.  Review of systems is markedly limited given patient's baseline dementia.  Objective: Vitals:   03/10/20 1241 03/10/20 1811 03/10/20 2124 03/11/20 0600  BP: (!) 145/79 132/76 119/76 120/78  Pulse: 70 66 60 70  Resp: '18 18 17 18  '$ Temp: 99.1 F (37.3 C) 98.7 F (37.1 C) 98 F (36.7 C) 98.5 F (36.9 C)  TempSrc: Axillary Oral Oral Oral  SpO2: 98% 95% 99% 98%  Weight:  Height:        Intake/Output Summary (Last 24 hours) at 03/11/2020 0710 Last data filed at 03/10/2020 1812 Gross per 24 hour  Intake 360 ml  Output 850 ml  Net -490 ml   Filed Weights   03/06/20 2023 03/07/20 2108 03/08/20 2111  Weight: 87.5 kg 88.6 kg 88.6 kg    Examination:  General:  Pleasantly resting in bed, No acute distress.  Alert to person only, confabulates and meanders throughout conversation but answers simple yes no questions appropriately for the most part HEENT:  Normocephalic atraumatic.   Sclerae nonicteric, noninjected.  Extraocular movements intact bilaterally. Neck:  Without mass or deformity.  Trachea is midline. Lungs:  Clear to auscultate bilaterally without rhonchi, wheeze, or rales. Heart:  Regular rate and rhythm.  Without murmurs, rubs, or gallops. Abdomen:  Soft, nontender, nondistended.  Without guarding or rebound. Extremities: Without cyanosis, clubbing, edema, or obvious deformity. Vascular:  Dorsalis pedis and posterior tibial pulses palpable bilaterally. Skin:  Warm and dry, no erythema, no ulcerations.  Data Reviewed: I have personally reviewed following labs and imaging studies  CBC: Recent Labs  Lab 03/05/20 1340 03/05/20 1340 03/07/20 0449 03/08/20 0833 03/09/20 0504 03/10/20 0713 03/11/20 0344  WBC 8.6   < > 3.6* 4.1 4.2 4.8 6.4  NEUTROABS 7.5  --   --   --   --   --   --   HGB 15.1   < > 12.1* 13.0 12.2* 12.7* 12.1*  HCT 47.3   < > 37.4* 41.0 37.4* 39.3 37.3*  MCV 89.8   < > 91.9 91.1 89.3 88.3 88.2  PLT 176   < > 133* 154 163 201 199   < > = values in this interval not displayed.   Basic Metabolic Panel: Recent Labs  Lab 03/06/20 0104 03/06/20 0322 03/07/20 0449 03/08/20 3536 03/09/20 0504 03/10/20 0713 03/11/20 0344  NA  --    < > 140 138 139 138 136  K  --    < > 4.1 4.1 4.3 4.2 4.5  CL  --    < > 113* 111 112* 112* 110  CO2  --    < > 18* 18* 17* 18* 12*  GLUCOSE  --    < > 99 122* 98 100* 117*  BUN  --    < > 25* 20 24* 19 22  CREATININE  --    < > 2.32* 1.93* 2.03* 1.88* 1.93*  CALCIUM  --    < > 8.2* 8.4* 8.2* 8.5* 8.5*  MG 1.6*  --   --   --   --   --   --    < > = values in this interval not displayed.   GFR: Estimated Creatinine Clearance: 33.5 mL/min (A) (by C-G formula based on SCr of 1.93 mg/dL (H)). Liver Function Tests: Recent Labs  Lab 03/07/20 0449 03/08/20 0833 03/09/20 0504 03/10/20 0713 03/11/20 0344  AST 50* 36 44* 69* 55*  ALT 115* 91* 76* 78* 69*  ALKPHOS 91 94 80 80 67  BILITOT 0.8 0.9 0.6  1.2 1.0  PROT 5.5* 6.2* 5.5* 6.2* 5.8*  ALBUMIN 2.3* 2.5* 2.5* 2.8* 2.6*   Recent Labs  Lab 03/05/20 1604  LIPASE 35   Recent Labs  Lab 03/05/20 1604  AMMONIA 38*   Coagulation Profile: Recent Labs  Lab 03/05/20 1340  INR 1.1   Cardiac Enzymes: No results for input(s): CKTOTAL, CKMB, CKMBINDEX, TROPONINI in the last 168 hours. BNP (  last 3 results) No results for input(s): PROBNP in the last 8760 hours. HbA1C: No results for input(s): HGBA1C in the last 72 hours. CBG: Recent Labs  Lab 03/09/20 2031 03/10/20 0651 03/10/20 1110 03/10/20 1658 03/10/20 2100  GLUCAP 122* 84 124* 132* 157*   Lipid Profile: No results for input(s): CHOL, HDL, LDLCALC, TRIG, CHOLHDL, LDLDIRECT in the last 72 hours. Thyroid Function Tests: No results for input(s): TSH, T4TOTAL, FREET4, T3FREE, THYROIDAB in the last 72 hours. Anemia Panel: No results for input(s): VITAMINB12, FOLATE, FERRITIN, TIBC, IRON, RETICCTPCT in the last 72 hours. Sepsis Labs: Recent Labs  Lab 03/05/20 1340 03/05/20 1604 03/06/20 0104 03/06/20 0740  PROCALCITON  --   --  1.57  --   LATICACIDVEN 3.3* 4.2* 3.1* 1.4    Recent Results (from the past 240 hour(s))  Culture, blood (Routine x 2)     Status: None   Collection Time: 03/05/20  1:40 PM   Specimen: BLOOD  Result Value Ref Range Status   Specimen Description BLOOD SITE NOT SPECIFIED  Final   Special Requests   Final    BOTTLES DRAWN AEROBIC AND ANAEROBIC Blood Culture adequate volume   Culture   Final    NO GROWTH 5 DAYS Performed at [facility], [place] [address], [place], [place] [address]    Report Status 03/10/2020 FINAL  Final  Culture, blood (Routine x 2)     Status: Abnormal   Collection Time: 03/05/20  4:04 PM   Specimen: BLOOD  Result Value Ref Range Status   Specimen Description BLOOD  Final   Special Requests   Final    BOTTLES DRAWN AEROBIC AND ANAEROBIC Blood Culture adequate volume   Culture  Setup Time   Final    ANAEROBIC  BOTTLE ONLY GRAM POSITIVE COCCI IN CHAINS CRITICAL RESULT CALLED TO, READ BACK BY AND VERIFIED WITH: J LEDFORD PHARMD 03/06/20 2357 JDW    Culture (A)  Final    VIRIDANS STREPTOCOCCUS THE SIGNIFICANCE OF ISOLATING THIS ORGANISM FROM A SINGLE SET OF BLOOD CULTURES WHEN MULTIPLE SETS ARE DRAWN IS UNCERTAIN. PLEASE NOTIFY THE MICROBIOLOGY DEPARTMENT WITHIN ONE WEEK IF SPECIATION AND SENSITIVITIES ARE REQUIRED. Performed at [facility], [place] [address], [place], [place] [address]    Report Status 03/08/2020 FINAL  Final  Blood Culture ID Panel (Reflexed)     Status: Abnormal   Collection Time: 03/05/20  4:04 PM  Result Value Ref Range Status   Enterococcus species NOT DETECTED NOT DETECTED Final   Listeria monocytogenes NOT DETECTED NOT DETECTED Final   Staphylococcus species NOT DETECTED NOT DETECTED Final   Staphylococcus aureus (BCID) NOT DETECTED NOT DETECTED Final   Streptococcus species DETECTED (A) NOT DETECTED Final    Comment: Not Enterococcus species, Streptococcus agalactiae, Streptococcus pyogenes, or Streptococcus pneumoniae. CRITICAL RESULT CALLED TO, READ BACK BY AND VERIFIED WITH: J LEDFORD PHARMD 03/06/20 2357 JDW    Streptococcus agalactiae NOT DETECTED NOT DETECTED Final   Streptococcus pneumoniae NOT DETECTED NOT DETECTED Final   Streptococcus pyogenes NOT DETECTED NOT DETECTED Final   Acinetobacter baumannii NOT DETECTED NOT DETECTED Final   Enterobacteriaceae species NOT DETECTED NOT DETECTED Final   Enterobacter cloacae complex NOT DETECTED NOT DETECTED Final   Escherichia coli NOT DETECTED NOT DETECTED Final   Klebsiella oxytoca NOT DETECTED NOT DETECTED Final   Klebsiella pneumoniae NOT DETECTED NOT DETECTED Final   Proteus species NOT DETECTED NOT DETECTED Final   Serratia marcescens NOT DETECTED NOT DETECTED Final  Haemophilus influenzae NOT DETECTED NOT DETECTED Final   Neisseria meningitidis NOT DETECTED NOT DETECTED Final   Pseudomonas aeruginosa NOT  DETECTED NOT DETECTED Final   Candida albicans NOT DETECTED NOT DETECTED Final   Candida glabrata NOT DETECTED NOT DETECTED Final   Candida krusei NOT DETECTED NOT DETECTED Final   Candida parapsilosis NOT DETECTED NOT DETECTED Final   Candida tropicalis NOT DETECTED NOT DETECTED Final    Comment: Performed at [facility], [place] [address], [place], [place] [address]  Urine culture     Status: Abnormal   Collection Time: 03/05/20  6:36 PM   Specimen: Urine, Clean Catch  Result Value Ref Range Status   Specimen Description URINE, CLEAN CATCH  Final   Special Requests   Final    NONE Performed at [facility], [place] [address] [place], [place] [address]    Culture MULTIPLE SPECIES PRESENT, SUGGEST RECOLLECTION (A)  Final   Report Status 03/07/2020 FINAL  Final  SARS CORONAVIRUS 2 (TAT 6-24 HRS) Nasopharyngeal Nasopharyngeal Swab     Status: None   Collection Time: 03/05/20  7:00 PM   Specimen: Nasopharyngeal Swab  Result Value Ref Range Status   SARS Coronavirus 2 NEGATIVE NEGATIVE Final    Comment: (NOTE) SARS-CoV-2 target nucleic acids are NOT DETECTED. The SARS-CoV-2 RNA is generally detectable in upper and lower respiratory specimens during the acute phase of infection. Negative results do not preclude SARS-CoV-2 infection, do not rule out co-infections with other pathogens, and should not be used as the sole basis for treatment or other patient management decisions. Negative results must be combined with clinical observations, patient history, and epidemiological information. The expected result is Negative. Fact Sheet for Patients: SugarRoll.be Fact Sheet for Healthcare Providers: https://www.woods-mathews.com/ This test is not yet approved or cleared by the [place] FDA and  has been authorized for detection and/or diagnosis of SARS-CoV-2 by FDA under an Emergency Use Authorization (EUA). This EUA will remain  in  effect (meaning this test can be used) for the duration of the COVID-19 declaration under Section 56 4(b)(1) of the Act, 21 U.S.C. section 360bbb-3(b)(1), unless the authorization is terminated or revoked sooner. Performed at [facility], [place] [address] [place], [place] [address]   Culture, blood (routine x 2)     Status: None (Preliminary result)   Collection Time: 03/08/20  8:33 AM   Specimen: BLOOD  Result Value Ref Range Status   Specimen Description BLOOD RIGHT ANTECUBITAL  Final   Special Requests   Final    BOTTLES DRAWN AEROBIC ONLY Blood Culture adequate volume   Culture   Final    NO GROWTH 2 DAYS Performed at [facility], [place] [address] [place], [place] [address]    Report Status PENDING  Incomplete  Culture, blood (routine x 2)     Status: None (Preliminary result)   Collection Time: 03/08/20  8:39 AM   Specimen: BLOOD LEFT HAND  Result Value Ref Range Status   Specimen Description BLOOD LEFT HAND  Final   Special Requests   Final    BOTTLES DRAWN AEROBIC ONLY Blood Culture results may not be optimal due to an inadequate volume of blood received in culture bottles   Culture   Final    NO GROWTH 2 DAYS Performed at [facility], [place] [address], [place], [place] [address]    Report Status PENDING  Incomplete         Radiology  Studies: ECHOCARDIOGRAM COMPLETE  Result Date: 03/09/2020    ECHOCARDIOGRAM REPORT   Patient Name:   TELESFORO BROSNAHAN Date of Exam: 03/09/2020 Medical Rec #:  [id number]        Height:       72.0 in Accession #:    [id number]       Weight:       195.3 lb Date of Birth:  Sep 13, 1941        BSA:          2.109 m Patient Age:    13 years         BP:           127/74 mmHg Patient Gender: M                HR:           71 bpm. Exam Location:  Inpatient Procedure: 2D Echo, Cardiac Doppler and Color Doppler Indications:    Bacteremia 790.7/R78.81  History:        Patient has prior history of Echocardiogram examinations, most                  recent 11/25/2019. Risk Factors:Sleep Apnea, Diabetes and                 Dyslipidemia.  Sonographer:    Clayton Lefort RDCS (AE) Referring Phys: [id number] [place]  Sonographer Comments: Limited abillity to position patient. IMPRESSIONS  1. Left ventricular ejection fraction, by estimation, is 60 to 65%. The left ventricle has normal function. The left ventricle has no regional wall motion abnormalities. Left ventricular diastolic parameters are consistent with Grade I diastolic dysfunction (impaired relaxation).  2. Right ventricular systolic function is mildly reduced. The right ventricular size is moderately enlarged.  3. The mitral valve is normal in structure. Trivial mitral valve regurgitation. No evidence of mitral stenosis.  4. The tricuspid valve is degenerative. Mild TR.  5. The aortic valve is abnormal. Aortic valve regurgitation is not visualized. Doppler alignment suboptimal for assessment of aortic stenosis. Comparison(s): A prior study was performed on 11/25/19. Prior images reviewed side by side. Conclusion(s)/Recommendation(s): Cannot exclude small independently mobile echodensity on the ventricular aspect of the tricuspid valve, however this most likely represents degenerative change of the tricuspid valve given mild TR and similar valve appearance on prior echocardiogram. FINDINGS  Left Ventricle: Left ventricular ejection fraction, by estimation, is 60 to 65%. The left ventricle has normal function. The left ventricle has no regional wall motion abnormalities. The left ventricular internal cavity size was normal in size. There is  no left ventricular hypertrophy. Left ventricular diastolic parameters are consistent with Grade I diastolic dysfunction (impaired relaxation). Right Ventricle: The right ventricular size is moderately enlarged. No increase in right ventricular wall thickness. Right ventricular systolic function is mildly reduced. Left Atrium: Left atrial size was normal in  size. Right Atrium: Right atrial size was not well visualized. Pericardium: There is no evidence of pericardial effusion. Mitral Valve: The mitral valve is normal in structure. Normal mobility of the mitral valve leaflets. Trivial mitral valve regurgitation. No evidence of mitral valve stenosis. MV peak gradient, 3.5 mmHg. The mean mitral valve gradient is 1.0 mmHg. Tricuspid Valve: The tricuspid valve is degenerative in appearance. Tricuspid valve regurgitation is mild . No evidence of tricuspid stenosis. Aortic Valve: The aortic valve is abnormal. Aortic valve regurgitation is not visualized. Doppler alignment suboptimal for assessment of aortic stenosis. There is moderate calcification of the aortic  valve. Aortic valve mean gradient measures 4.0 mmHg. Aortic valve peak gradient measures 8.1 mmHg. Aortic valve area, by VTI measures 2.07 cm. Pulmonic Valve: The pulmonic valve was not well visualized. Pulmonic valve regurgitation is trivial. No evidence of pulmonic stenosis. Aorta: Asc Ao is 37 mm, which may be within normal limits for age when indexed to BSA. The aortic root is normal in size and structure and the ascending aorta was not well visualized. Venous: The inferior vena cava was not well visualized. IAS/Shunts: The interatrial septum was not well visualized.  LEFT VENTRICLE PLAX 2D LVIDd:         4.30 cm  Diastology LVIDs:         2.70 cm  LV e' lateral:   7.83 cm/s LV PW:         1.30 cm  LV E/e' lateral: 6.7 LV IVS:        1.53 cm  LV e' medial:    3.81 cm/s LVOT diam:     2.20 cm  LV E/e' medial:  13.7 LV SV:         64 LV SV Index:   30 LVOT Area:     3.80 cm  RIGHT VENTRICLE RV Mid diam:    3.16 cm RV S prime:     19.10 cm/s TAPSE (M-mode): 2.6 cm LEFT ATRIUM             Index       RIGHT ATRIUM           Index LA diam:        3.90 cm 1.85 cm/m  RA Area:     14.70 cm LA Vol (A2C):   49.7 ml 23.56 ml/m RA Volume:   30.60 ml  14.51 ml/m LA Vol (A4C):   50.6 ml 23.99 ml/m LA Biplane Vol: 50.4 ml  23.89 ml/m  AORTIC VALVE AV Area (Vmax):    1.71 cm AV Area (Vmean):   1.85 cm AV Area (VTI):     2.07 cm AV Vmax:           142.00 cm/s AV Vmean:          95.100 cm/s AV VTI:            0.311 m AV Peak Grad:      8.1 mmHg AV Mean Grad:      4.0 mmHg LVOT Vmax:         64.00 cm/s LVOT Vmean:        46.200 cm/s LVOT VTI:          0.169 m LVOT/AV VTI ratio: 0.54  AORTA Ao Root diam: 3.70 cm MITRAL VALVE               TRICUSPID VALVE MV Area (PHT): 2.29 cm    TR Peak grad:   26.4 mmHg MV Peak grad:  3.5 mmHg    TR Vmax:        257.00 cm/s MV Mean grad:  1.0 mmHg MV Vmax:       0.94 m/s    SHUNTS MV Vmean:      41.4 cm/s   Systemic VTI:  0.17 m MV Decel Time: 331 msec    Systemic Diam: 2.20 cm MV E velocity: 52.30 cm/s MV A velocity: 78.00 cm/s MV E/A ratio:  0.67 Cherlynn Kaiser MD Electronically signed by Cherlynn Kaiser MD Signature Date/Time: 03/09/2020/3:44:56 PM    Final         Scheduled  Meds: . apixaban  5 mg Oral BID  . aspirin EC  81 mg Oral Daily  . bisacodyl  10 mg Rectal Once  . insulin aspart  0-5 Units Subcutaneous QHS  . insulin aspart  0-9 Units Subcutaneous TID WC   Continuous Infusions:    LOS: 6 days   Time spent: 61mn  Mignon Bechler C [name], DO [facility]  If 7PM-7AM, please contact night-coverage [email or web address]  03/11/2020, 7:10 AM

## 2020-03-11 NOTE — Progress Notes (Signed)
Physical Therapy Treatment Patient Details Name: Tony Fox MRN: [id number] DOB: 09-Apr-1941 Today's Date: 03/11/2020    History of Present Illness Tony Fox is a 79 y.o. male with medical history significant of Alzheimer's dementia, BPH, GERD, hypertension, hyperlipidemia, OSA on CPAP, noninsulin-dependent type 2 diabetes, CKD stage III, right lower extremity DVT diagnosed in January 2021 presenting to the ED via EMS for evaluation of generalized weakness, fever, and altered mental status. Found to be septic    PT Comments    Continuing work on functional mobility and activity tolerance;  Noted Tony Fox has been at a SNF before, and did not thrive; Plan is changed to Home with 24 hour assist, and to [place] services; Today's session focused on discerning best options for safe transfers and mobility; Reattempted standing from sitting EOB, without success -- tending to lean posteriorly and to the Right; Then performed dependent bed to chair transfer using Maximove (similar to hoyer lift) -- Mr . Fox tolerated the lift transfer well, and seemed to feel more comfortable when given a "job"; for example, we asked him to hold to the maximove harness bar and to inform us if it moved relative to his body; Positioned compfrtably in recliner, chair alarm on;   I agree with dc home -- often home with familiar environment, routine, and caregivers is the most therapuetic place for pt's with dementia; Heartily agree with getting his family a [employer] fast streamer and ambulance ride home  Follow Up Recommendations  Home health PT;Supervision/Assistance - 24 hour;Other (comment)([facility] services)     Equipment Recommendations  Other (comment);Wheelchair cushion (measurements PT)(Hoyer Lift, Ambulance ride home)    Recommendations for Other Services       Precautions / Restrictions Precautions Precautions: Fall Restrictions Weight Bearing Restrictions: No    Mobility  Bed  Mobility Overal bed mobility: Needs Assistance Bed Mobility: Rolling;Supine to Sit Rolling: Mod assist;Max assist   Supine to sit: Mod assist     General bed mobility comments: Heavy mod assist to roll R; Difficulty organizing to reach across to R bedrail with LUE despite cues and hand over hand assist; Max assist to roll L; Heavy mod (almost Max) assist to push up from R sidelying to sit; tends to lean R  Transfers Overall transfer level: Needs assistance               General transfer comment: Employed Maximove for safe transfer OOB to chair; He needs clear instruction for where he puts his hands  Ambulation/Gait                 Stairs             Wheelchair Mobility    Modified Rankin (Stroke Patients Only)       Balance Overall balance assessment: Needs assistance Sitting-balance support: Feet supported Sitting balance-Leahy Scale: Poor Sitting balance - Comments: Tends to lean on R elbow; able to keep his balance for brief moments with bil UE support Postural control: Right lateral lean                                  Cognition Arousal/Alertness: Awake/alert Behavior During Therapy: WFL for tasks assessed/performed(Super tangential ) Overall Cognitive Status: No family/caregiver present to determine baseline cognitive functioning Area of Impairment: Orientation;Attention;Following commands                 Orientation Level: Disoriented to;Time;Situation;Place Current  Attention Level: Sustained(easily internally distracted from task at hand)   Following Commands: Follows one step commands with increased time;Follows one step commands inconsistently       General Comments: PMH of dementia; suspect close to baseline      Exercises      General Comments        Pertinent Vitals/Pain Pain Assessment: Faces Faces Pain Scale: Hurts a little bit Pain Location: generalized; grimace with movement Pain Descriptors /  Indicators: Grimacing Pain Intervention(s): Repositioned    Home Living                      Prior Function            PT Goals (current goals can now be found in the care plan section) Acute Rehab PT Goals Patient Stated Goal: "I will do whatever it is you think is best" PT Goal Formulation: Patient unable to participate in goal setting Time For Goal Achievement: 03/23/20 Potential to Achieve Goals: Fair Progress towards PT goals: Progressing toward goals(slowly)    Frequency    Min 3X/week      PT Plan Discharge plan needs to be updated;Frequency needs to be updated    Co-evaluation              AM-PAC PT "6 Clicks" Mobility   Outcome Measure  Help needed turning from your back to your side while in a flat bed without using bedrails?: A Lot Help needed moving from lying on your back to sitting on the side of a flat bed without using bedrails?: A Lot Help needed moving to and from a bed to a chair (including a wheelchair)?: Total Help needed standing up from a chair using your arms (e.g., wheelchair or bedside chair)?: Total Help needed to walk in hospital room?: Total Help needed climbing 3-5 steps with a railing? : Total 6 Click Score: 8    End of Session Equipment Utilized During Treatment: Gait belt Activity Tolerance: Patient tolerated treatment well Patient left: in chair;with call bell/phone within reach;with chair alarm set Nurse Communication: Mobility status PT Visit Diagnosis: Other abnormalities of gait and mobility (R26.89);Muscle weakness (generalized) (M62.81)     Time: 6701-4103 PT Time Calculation (min) (ACUTE ONLY): 34 min  Charges:  $Therapeutic Activity: 23-37 mins                     Roney Marion, [place]  Acute Rehabilitation Services Pager [phone] Office [place] 03/11/2020, 11:45 AM

## 2020-03-11 NOTE — TOC Progression Note (Signed)
Transition of Care [facility]) - Progression Note    Patient Details  Name: Tony Fox MRN: [id number] Date of Birth: [birth year]  Transition of Care [facility]) CM/SW Contact  Bartholomew Crews, RN Phone Number: [phone] 03/11/2020, 4:46 PM  Clinical Narrative:     Spoke with patient's spouse at the bedside. Discussed transition plans. Spoke with [employer] about correct contact information to arrange hoyer lift delivery. Patient to go home with home health services through [facility]. Spouse has additional help at home. Patient will need ambulance transportation. TOC team following for transition needs.   Expected Discharge Plan: [place] Barriers to Discharge: Continued Medical Work up  Expected Discharge Plan and Services Expected Discharge Plan: [place] In-house Referral: Clinical Social Work Discharge Planning Services: CM Consult Post Acute Care Choice: Durable Medical Equipment Living arrangements for the past 2 months: Single Family Home                 DME Arranged: Other see comment(hoyer lift) DME Agency: [employer] Date DME Agency Contacted: 03/10/20 Time DME Agency Contacted: [phone] Representative spoke with at DME Agency: Thedore Mins HH Arranged: RN, PT, OT, Nurse's Aide, Social Work [place]: [facility] [facility] [employer] Date [place]: 03/10/20 Time [place]: [phone] Representative spoke with at [place]: [place] ([place]) Interventions    Readmission Risk Interventions Readmission Risk Prevention Plan 03/09/2020  Transportation Screening Complete  Medication Review Press photographer) Complete  SW Recovery Care/Counseling Consult Not Complete  Some recent data might be hidden

## 2020-03-11 NOTE — Plan of Care (Signed)
  Problem: Fluid Volume: Goal: Hemodynamic stability will improve Outcome: Progressing   Problem: Clinical Measurements: Goal: Diagnostic test results will improve Outcome: Progressing   

## 2020-03-12 LAB — CBC
HCT: 37.9 % — ABNORMAL LOW (ref 39.0–52.0)
Hemoglobin: 12.1 g/dL — ABNORMAL LOW (ref 13.0–17.0)
MCH: 28.4 pg (ref 26.0–34.0)
MCHC: 31.9 g/dL (ref 30.0–36.0)
MCV: 89 fL (ref 80.0–100.0)
Platelets: 220 K/uL (ref 150–400)
RBC: 4.26 MIL/uL (ref 4.22–5.81)
RDW: 15.3 % (ref 11.5–15.5)
WBC: 6.7 K/uL (ref 4.0–10.5)
nRBC: 0 % (ref 0.0–0.2)

## 2020-03-12 LAB — GLUCOSE, CAPILLARY: Glucose-Capillary: 89 mg/dL (ref 70–99)

## 2020-03-12 MED ORDER — APIXABAN 5 MG PO TABS: 5.0000 mg | ORAL_TABLET | Freq: Two times a day (BID) | ORAL | 0 refills | Status: AC

## 2020-03-12 NOTE — Progress Notes (Signed)
Tony Fox to be discharged Home per MD order. Discussed prescriptions and follow up appointments with the patient's wife. Prescriptions and medication list explained in detail with wife. AVS sent through email to his wife as forgot to take it. Patient verbalized understanding.  Skin clean, dry and intact without evidence of skin break down, no evidence of skin tears noted. IV catheter discontinued intact. Site without signs and symptoms of complications. Dressing and pressure applied. Pt denies pain at the site currently. No complaints noted.  Patient refused to remove the external male cath, so disconnected it from the suction but left it on him.  He was very stubborn.  Will inform the wife.    Patient free of lines, drains, and wounds.   An After Visit Summary (AVS) was printed and given to the patient. Patient escorted via wheelchair, and discharged home via private auto.  Amaryllis Dyke, RN

## 2020-03-12 NOTE — Discharge Summary (Signed)
Physician Discharge Summary  Tony Fox OQH:[id number] DOB: 1941-05-16 DOA: 03/05/2020  PCP: Willey Blade, MD  Admit date: 03/05/2020 Discharge date: 03/12/2020  Admitted From: Home Disposition: Home with home health  Recommendations for Outpatient Follow-up:  1. Follow up with PCP in 1-2 weeks 2. Please obtain BMP/CBC in one week  Home Health: Yes Equipment/Devices: [employer] streamer  Discharge Condition: Stable CODE STATUS: DNR Diet recommendation: As tolerated  Brief/Interim Summary: Tony Jovel Johnsonis a 79 y.o.malewith medical history significant ofAlzheimer's dementia, BPH, GERD, hypertension, hyperlipidemia, OSA on CPAP, noninsulin-dependent type 2 diabetes, CKD stage III, right lower extremity DVT diagnosed in January 2021 presenting to the ED via EMS for evaluation of generalized weakness, fever, and altered mental status. According to EMS, patient was difficult to arouse this morning and not acting normal. They found him to be febrile and patient was complaining of some urinary symptoms. Patient is oriented to self only. He is not sure why he is here. He has no complaints. Denies fevers, chills, headache, neck pain/stiffness, cough, shortness of breath, chest pain, nausea, vomiting, abdominal pain, diarrhea, dysuria, or urinary frequency/urgency. No additional history could be obtained from him. In ED patient noted to be febrile with temperature 101.8 F. Initially tachycardic and tachypneic, now resolved. Not hypotensive. Not hypoxic. Labs showing no leukocytosis. Lactic acid 3.3 >4.2. Blood glucose 167. BUN 26, creatinine 2.6. Creatinine ranging between 1.9-2.9 a few months ago. AST 243, ALT 313, alk phos 157, and T bili 3.9. Lipase normal. No significant elevation of ammonia. Blood culture x2 pending. SARS-CoV-2 rapid antigen test negative, PCR test pending. UA not suggestive of infection. Urine culture pending. Hospitalist called to admit  Patient admitted  as above with acute metabolic encephalopathy on chronic dementia in the setting of sepsis secondary to UTI.  She had 1 out of 4 blood cultures positive for strep viridans concerning for acute infection however more likely contaminant given negative echo, and discussion with ID.  At this time patient has completed antibiotic course for UTI, mental status back to baseline per wife.  As possible disposition to nursing facility however given patient's dementia and history previously not tolerating new living setting and worsening confusion patient's wife and family declined discharge to SNF and would prefer home health.  At this time given patient's improved clinical status, resolution of infection and mental status changes will discharge home with home health per family wishes, patient's chronic comorbid conditions including dementia, CKD 3B non-insulin-dependent diabetes hypertension and sleep apnea appear to be well controlled.  Close follow-up with PCP in the next 1 to 2 weeks for further evaluation and treatment as well as to ensure complete resolution of infection given completion of antibiotic course as above.  Discharge Diagnoses:  Principal Problem:   Sepsis ([place]) Active Problems:   OSA (obstructive sleep apnea)   Acute metabolic encephalopathy   Elevated LFTs   Type 2 diabetes mellitus [facility])  Discharge Instructions  Discharge Instructions    Call MD for:  difficulty breathing, headache or visual disturbances   Complete by: As directed    Call MD for:  extreme fatigue   Complete by: As directed    Call MD for:  hives   Complete by: As directed    Call MD for:  persistant dizziness or light-headedness   Complete by: As directed    Call MD for:  persistant nausea and vomiting   Complete by: As directed    Call MD for:  severe uncontrolled pain   Complete by:  As directed    Call MD for:  temperature >100.4   Complete by: As directed    Diet - low sodium heart healthy   Complete by: As  directed    Increase activity slowly   Complete by: As directed    Increase activity slowly   Complete by: As directed      Allergies as of 03/12/2020   No Known Allergies     Medication List    STOP taking these medications   atorvastatin 20 MG tablet Commonly known as: LIPITOR   hydrALAZINE 25 MG tablet Commonly known as: APRESOLINE   olmesartan-hydrochlorothiazide 20-12.5 MG tablet Commonly known as: BENICAR HCT   QUEtiapine 25 MG tablet Commonly known as: SEROQUEL   Vitamin D (Ergocalciferol) 1.25 MG (50000 UNIT) Caps capsule Commonly known as: DRISDOL     TAKE these medications   acetaminophen 500 MG tablet Commonly known as: TYLENOL Take 1,000 mg by mouth every 6 (six) hours as needed (for pain).   apixaban 5 MG Tabs tablet Commonly known as: ELIQUIS Take 1 tablet (5 mg total) by mouth 2 (two) times daily. What changed:   medication strength  how much to take  Another medication with the same name was removed. Continue taking this medication, and follow the directions you see here.   ascorbic acid 500 MG tablet Commonly known as: VITAMIN C Take 500 mg by mouth 2 (two) times a week.   aspirin EC 81 MG tablet Take 81 mg by mouth daily.   donepezil 10 MG tablet Commonly known as: ARICEPT Take 10 mg by mouth in the morning. What changed: Another medication with the same name was removed. Continue taking this medication, and follow the directions you see here.   furosemide 20 MG tablet Commonly known as: LASIX Take 20 mg by mouth in the morning.   glipizide-metformin 2.5-250 MG tablet Commonly known as: METAGLIP Take 1 tablet by mouth daily. What changed: Another medication with the same name was removed. Continue taking this medication, and follow the directions you see here.   liver oil-zinc oxide 40 % ointment Commonly known as: DESITIN Apply 1 application topically as needed for irritation (to affected sites).   NON FORMULARY Diet -  Liquids: _X__Regular; ___Thickened ___ Consistency: ___ Nectar, ___Honey, ___ Pudding; ____ Fluid Restriction General Diet: NAS CON CHO   VITAMIN D-3 PO Take 1 capsule by mouth See admin instructions. Take 1 capsule by mouth 2 times a week- on Mondays and Fridays            Durable Medical Equipment  (From admission, onward)         Start     Ordered   03/11/20 0957  For home use only DME Other see comment  Once    Comments: Harrel Lemon lift  Question:  Length of Need  Answer:  Lifetime   03/11/20 0957         Follow-up Information    Home, [facility].   Specialty: Home Health Services Why: the office will call to schedule home health visits Contact information: [place] [address] [phone]          No Known Allergies  Procedures/Studies: CT ABDOMEN PELVIS WO CONTRAST  Result Date: 03/05/2020 CLINICAL DATA:  79 year old male with fever of 101.4, suspected sepsis. past medical history significant for bladder cancer, diabetes, hyperlipidemia, prior DVT on Eliquis, and sleep apnea who presents for fatigue, altered mental status, reported dysuria, and  fever. According to EMS, patient was difficult to arouse this morning and not acting normal. They found him to have a fever and patient was complaining of some urinary symptoms. They brought him to evaluation to the emergency department and was found to have fever and tachycardia. EXAM: CT ABDOMEN AND PELVIS WITHOUT CONTRAST TECHNIQUE: Multidetector CT imaging of the abdomen and pelvis was performed following the standard protocol without IV contrast. COMPARISON:  CT, 02/12/2018 FINDINGS: Lower chest: No acute abnormality. Hepatobiliary: Normal liver. Gallbladder is distended, but there is no gallbladder wall thickening or adjacent inflammation. No visualized stones. No bile duct dilation. Pancreas: Unremarkable. No pancreatic ductal dilatation or surrounding inflammatory changes. Spleen: Normal in  size without focal abnormality. Adrenals/Urinary Tract: No adrenal masses. Bilateral renal cortical thinning overall mildly reduced renal sizes. Subtle, nonspecific low-attenuation mass, midpole the left kidney, 11 mm, most likely a cyst. No other masses. Bilateral nonobstructing intrarenal stones. No hydronephrosis. Ureters are normal in course and in caliber. Bladder is unremarkable. Stomach/Bowel: Rectum is moderate to markedly distended with stool. No wall thickening or adjacent inflammation. Colon is normal in caliber. Mild colonic stool burden. No colonic wall thickening or inflammation. Stomach and small bowel are unremarkable. Appendix not visualized. No evidence of appendicitis. Vascular/Lymphatic: Aortic atherosclerotic calcifications. No aneurysm. No enlarged lymph nodes. Reproductive: Unremarkable. Other: No abdominal wall hernia or abnormality. No abdominopelvic ascites. Musculoskeletal: No fracture or acute finding. No osteoblastic or osteolytic lesions. IMPRESSION: 1. No acute findings within the abdomen or pelvis. No findings to account for the patient's fever. 2. Distended gallbladder, without CT evidence of acute cholecystitis. 3. Bilateral intrarenal stones.  No hydronephrosis. 4. Rectum significantly distended with stool. Mild increase in colonic stool burden. No bowel obstruction or inflammation. 5. Aortic atherosclerosis. Electronically Signed   By: Lajean Manes M.D.   On: 03/05/2020 19:20   DG Chest 2 View  Result Date: 03/05/2020 CLINICAL DATA:  79 year old male with fever of 101.4, suspected sepsis. EXAM: CHEST - 2 VIEW COMPARISON:  Portable chest 11/25/2019 and earlier. FINDINGS: Upright AP and lateral views of the chest. Low lung volumes. Mediastinal contours remain within normal limits. Visualized tracheal air column is within normal limits. No pneumothorax or pulmonary edema. No pleural effusion or confluent pulmonary opacity. No acute osseous abnormality identified. Visible  bowel-gas pattern within normal limits. IMPRESSION: Low lung volumes, otherwise no acute cardiopulmonary abnormality. Electronically Signed   By: Genevie Ann M.D.   On: 03/05/2020 13:45   CT Head Wo Contrast  Result Date: 03/05/2020 CLINICAL DATA:  Confusion EXAM: CT HEAD WITHOUT CONTRAST TECHNIQUE: Contiguous axial images were obtained from the base of the skull through the vertex without intravenous contrast. COMPARISON:  11/24/2019 FINDINGS: Brain: There is atrophy and chronic small vessel disease changes. No acute intracranial abnormality. Specifically, no hemorrhage, hydrocephalus, mass lesion, acute infarction, or significant intracranial injury. Vascular: No hyperdense vessel or unexpected calcification. Skull: No acute calvarial abnormality. Sinuses/Orbits: Visualized paranasal sinuses and mastoids clear. Orbital soft tissues unremarkable. Other: None IMPRESSION: Advanced chronic microvascular disease and diffuse cerebral atrophy. No acute intracranial abnormality. Electronically Signed   By: Rolm Baptise M.D.   On: 03/05/2020 22:10   ECHOCARDIOGRAM COMPLETE  Result Date: 03/09/2020    ECHOCARDIOGRAM REPORT   Patient Name:   Tony Fox Date of Exam: 03/09/2020 Medical Rec #:  [id number]        Height:       72.0 in Accession #:    [id number]       Weight:  195.3 lb Date of Birth:  01-21-1941        BSA:          2.109 m Patient Age:    53 years         BP:           127/74 mmHg Patient Gender: M                HR:           71 bpm. Exam Location:  Inpatient Procedure: 2D Echo, Cardiac Doppler and Color Doppler Indications:    Bacteremia 790.7/R78.81  History:        Patient has prior history of Echocardiogram examinations, most                 recent 11/25/2019. Risk Factors:Sleep Apnea, Diabetes and                 Dyslipidemia.  Sonographer:    Clayton Lefort RDCS (AE) Referring Phys: [id number] [place]  Sonographer Comments: Limited abillity to position patient. IMPRESSIONS  1. Left  ventricular ejection fraction, by estimation, is 60 to 65%. The left ventricle has normal function. The left ventricle has no regional wall motion abnormalities. Left ventricular diastolic parameters are consistent with Grade I diastolic dysfunction (impaired relaxation).  2. Right ventricular systolic function is mildly reduced. The right ventricular size is moderately enlarged.  3. The mitral valve is normal in structure. Trivial mitral valve regurgitation. No evidence of mitral stenosis.  4. The tricuspid valve is degenerative. Mild TR.  5. The aortic valve is abnormal. Aortic valve regurgitation is not visualized. Doppler alignment suboptimal for assessment of aortic stenosis. Comparison(s): A prior study was performed on 11/25/19. Prior images reviewed side by side. Conclusion(s)/Recommendation(s): Cannot exclude small independently mobile echodensity on the ventricular aspect of the tricuspid valve, however this most likely represents degenerative change of the tricuspid valve given mild TR and similar valve appearance on prior echocardiogram. FINDINGS  Left Ventricle: Left ventricular ejection fraction, by estimation, is 60 to 65%. The left ventricle has normal function. The left ventricle has no regional wall motion abnormalities. The left ventricular internal cavity size was normal in size. There is  no left ventricular hypertrophy. Left ventricular diastolic parameters are consistent with Grade I diastolic dysfunction (impaired relaxation). Right Ventricle: The right ventricular size is moderately enlarged. No increase in right ventricular wall thickness. Right ventricular systolic function is mildly reduced. Left Atrium: Left atrial size was normal in size. Right Atrium: Right atrial size was not well visualized. Pericardium: There is no evidence of pericardial effusion. Mitral Valve: The mitral valve is normal in structure. Normal mobility of the mitral valve leaflets. Trivial mitral valve regurgitation.  No evidence of mitral valve stenosis. MV peak gradient, 3.5 mmHg. The mean mitral valve gradient is 1.0 mmHg. Tricuspid Valve: The tricuspid valve is degenerative in appearance. Tricuspid valve regurgitation is mild . No evidence of tricuspid stenosis. Aortic Valve: The aortic valve is abnormal. Aortic valve regurgitation is not visualized. Doppler alignment suboptimal for assessment of aortic stenosis. There is moderate calcification of the aortic valve. Aortic valve mean gradient measures 4.0 mmHg. Aortic valve peak gradient measures 8.1 mmHg. Aortic valve area, by VTI measures 2.07 cm. Pulmonic Valve: The pulmonic valve was not well visualized. Pulmonic valve regurgitation is trivial. No evidence of pulmonic stenosis. Aorta: Asc Ao is 37 mm, which may be within normal limits for age when indexed to BSA. The aortic root is normal  in size and structure and the ascending aorta was not well visualized. Venous: The inferior vena cava was not well visualized. IAS/Shunts: The interatrial septum was not well visualized.  LEFT VENTRICLE PLAX 2D LVIDd:         4.30 cm  Diastology LVIDs:         2.70 cm  LV e' lateral:   7.83 cm/s LV PW:         1.30 cm  LV E/e' lateral: 6.7 LV IVS:        1.53 cm  LV e' medial:    3.81 cm/s LVOT diam:     2.20 cm  LV E/e' medial:  13.7 LV SV:         64 LV SV Index:   30 LVOT Area:     3.80 cm  RIGHT VENTRICLE RV Mid diam:    3.16 cm RV S prime:     19.10 cm/s TAPSE (M-mode): 2.6 cm LEFT ATRIUM             Index       RIGHT ATRIUM           Index LA diam:        3.90 cm 1.85 cm/m  RA Area:     14.70 cm LA Vol (A2C):   49.7 ml 23.56 ml/m RA Volume:   30.60 ml  14.51 ml/m LA Vol (A4C):   50.6 ml 23.99 ml/m LA Biplane Vol: 50.4 ml 23.89 ml/m  AORTIC VALVE AV Area (Vmax):    1.71 cm AV Area (Vmean):   1.85 cm AV Area (VTI):     2.07 cm AV Vmax:           142.00 cm/s AV Vmean:          95.100 cm/s AV VTI:            0.311 m AV Peak Grad:      8.1 mmHg AV Mean Grad:      4.0 mmHg LVOT  Vmax:         64.00 cm/s LVOT Vmean:        46.200 cm/s LVOT VTI:          0.169 m LVOT/AV VTI ratio: 0.54  AORTA Ao Root diam: 3.70 cm MITRAL VALVE               TRICUSPID VALVE MV Area (PHT): 2.29 cm    TR Peak grad:   26.4 mmHg MV Peak grad:  3.5 mmHg    TR Vmax:        257.00 cm/s MV Mean grad:  1.0 mmHg MV Vmax:       0.94 m/s    SHUNTS MV Vmean:      41.4 cm/s   Systemic VTI:  0.17 m MV Decel Time: 331 msec    Systemic Diam: 2.20 cm MV E velocity: 52.30 cm/s MV A velocity: 78.00 cm/s MV E/A ratio:  0.67 Cherlynn Kaiser MD Electronically signed by Cherlynn Kaiser MD Signature Date/Time: 03/09/2020/3:44:56 PM    Final    US Abdomen Limited RUQ  Result Date: 03/05/2020 CLINICAL DATA:  Elevated liver function tests EXAM: ULTRASOUND ABDOMEN LIMITED RIGHT UPPER QUADRANT COMPARISON:  11/26/2019, 02/12/2018 FINDINGS: Gallbladder: Extensive biliary sludge is identified. No shadowing gallstones. No gallbladder wall thickening or pericholecystic fluid. Evaluation is limited by bowel gas and patient cooperation. Common bile duct: Diameter: 5 mm Liver: Left lobe liver obscured by bowel gas. Visualized portions of the liver  demonstrate diffuse increased echotexture consistent with hepatic steatosis. No intrahepatic duct dilation. Portal vein is patent on color Doppler imaging with normal direction of blood flow towards the liver. Other: None. IMPRESSION: 1. Biliary sludge, without cholelithiasis or cholecystitis. 2. Limited evaluation due to bowel gas and patient cooperation. 3. Hepatic steatosis. Electronically Signed   By: Randa Ngo M.D.   On: 03/05/2020 17:43     Subjective: No acute issues or events overnight, patient denies chest pain shortness of breath nausea vomiting diarrhea constipation headache fevers or chills, review of systems somewhat limited given patient's baseline mental status   Discharge Exam: Vitals:   03/11/20 2129 03/12/20 0506  BP: 115/68 (!) 141/90  Pulse: 65 64  Resp: 16 17   Temp: 98.4 F (36.9 C) 97.8 F (36.6 C)  SpO2: 97% 100%   Vitals:   03/11/20 1256 03/11/20 1833 03/11/20 2129 03/12/20 0506  BP: 118/74 107/83 115/68 (!) 141/90  Pulse: 74 66 65 64  Resp: _0 Temp: 97.8 F (36.6 C) 97.9 F (36.6 C) 98.4 F (36.9 C) 97.8 F (36.6 C)  TempSrc: Oral Oral    SpO2: 99% 98% 97% 100%  Weight:      Height:        General:  Pleasantly resting in bed, No acute distress. HEENT:  Normocephalic atraumatic.  Sclerae nonicteric, noninjected.  Extraocular movements intact bilaterally. Neck:  Without mass or deformity.  Trachea is midline. Lungs:  Clear to auscultate bilaterally without rhonchi, wheeze, or rales. Heart:  Regular rate and rhythm.  Without murmurs, rubs, or gallops. Abdomen:  Soft, nontender, nondistended.  Without guarding or rebound. Extremities: Without cyanosis, clubbing, edema, or obvious deformity. Vascular:  Dorsalis pedis and posterior tibial pulses palpable bilaterally. Skin:  Warm and dry, no erythema, no ulcerations.   The results of significant diagnostics from this hospitalization (including imaging, microbiology, ancillary and laboratory) are listed below for reference.     Microbiology: Recent Results (from the past 240 hour(s))  Culture, blood (Routine x 2)     Status: None   Collection Time: 03/05/20  1:40 PM   Specimen: BLOOD  Result Value Ref Range Status   Specimen Description BLOOD SITE NOT SPECIFIED  Final   Special Requests   Final    BOTTLES DRAWN AEROBIC AND ANAEROBIC Blood Culture adequate volume   Culture   Final    NO GROWTH 5 DAYS Performed at [facility], [address] [place], [place] [address]    Report Status 03/10/2020 FINAL  Final  Culture, blood (Routine x 2)     Status: Abnormal   Collection Time: 03/05/20  4:04 PM   Specimen: BLOOD  Result Value Ref Range Status   Specimen Description BLOOD  Final   Special Requests   Final    BOTTLES DRAWN AEROBIC AND ANAEROBIC Blood  Culture adequate volume   Culture  Setup Time   Final    ANAEROBIC BOTTLE ONLY GRAM POSITIVE COCCI IN CHAINS CRITICAL RESULT CALLED TO, READ BACK BY AND VERIFIED WITH: J LEDFORD PHARMD 03/06/20 2357 JDW    Culture (A)  Final    VIRIDANS STREPTOCOCCUS THE SIGNIFICANCE OF ISOLATING THIS ORGANISM FROM A SINGLE SET OF BLOOD CULTURES WHEN MULTIPLE SETS ARE DRAWN IS UNCERTAIN. PLEASE NOTIFY THE MICROBIOLOGY DEPARTMENT WITHIN ONE WEEK IF SPECIATION AND SENSITIVITIES ARE REQUIRED. Performed at [facility], [place] [address] [place], [place] [address]    Report Status 03/08/2020 FINAL  Final  Blood  Culture ID Panel (Reflexed)     Status: Abnormal   Collection Time: 03/05/20  4:04 PM  Result Value Ref Range Status   Enterococcus species NOT DETECTED NOT DETECTED Final   Listeria monocytogenes NOT DETECTED NOT DETECTED Final   Staphylococcus species NOT DETECTED NOT DETECTED Final   Staphylococcus aureus (BCID) NOT DETECTED NOT DETECTED Final   Streptococcus species DETECTED (A) NOT DETECTED Final    Comment: Not Enterococcus species, Streptococcus agalactiae, Streptococcus pyogenes, or Streptococcus pneumoniae. CRITICAL RESULT CALLED TO, READ BACK BY AND VERIFIED WITH: J LEDFORD PHARMD 03/06/20 2357 JDW    Streptococcus agalactiae NOT DETECTED NOT DETECTED Final   Streptococcus pneumoniae NOT DETECTED NOT DETECTED Final   Streptococcus pyogenes NOT DETECTED NOT DETECTED Final   Acinetobacter baumannii NOT DETECTED NOT DETECTED Final   Enterobacteriaceae species NOT DETECTED NOT DETECTED Final   Enterobacter cloacae complex NOT DETECTED NOT DETECTED Final   Escherichia coli NOT DETECTED NOT DETECTED Final   Klebsiella oxytoca NOT DETECTED NOT DETECTED Final   Klebsiella pneumoniae NOT DETECTED NOT DETECTED Final   Proteus species NOT DETECTED NOT DETECTED Final   Serratia marcescens NOT DETECTED NOT DETECTED Final   Haemophilus influenzae NOT DETECTED NOT DETECTED Final   Neisseria  meningitidis NOT DETECTED NOT DETECTED Final   Pseudomonas aeruginosa NOT DETECTED NOT DETECTED Final   Candida albicans NOT DETECTED NOT DETECTED Final   Candida glabrata NOT DETECTED NOT DETECTED Final   Candida krusei NOT DETECTED NOT DETECTED Final   Candida parapsilosis NOT DETECTED NOT DETECTED Final   Candida tropicalis NOT DETECTED NOT DETECTED Final    Comment: Performed at [facility], [place] [address] [place], [place] [address]  Urine culture     Status: Abnormal   Collection Time: 03/05/20  6:36 PM   Specimen: Urine, Clean Catch  Result Value Ref Range Status   Specimen Description URINE, CLEAN CATCH  Final   Special Requests   Final    NONE Performed at [facility], [place] [address], [place], [place] [address]    Culture MULTIPLE SPECIES PRESENT, SUGGEST RECOLLECTION (A)  Final   Report Status 03/07/2020 FINAL  Final  SARS CORONAVIRUS 2 (TAT 6-24 HRS) Nasopharyngeal Nasopharyngeal Swab     Status: None   Collection Time: 03/05/20  7:00 PM   Specimen: Nasopharyngeal Swab  Result Value Ref Range Status   SARS Coronavirus 2 NEGATIVE NEGATIVE Final    Comment: (NOTE) SARS-CoV-2 target nucleic acids are NOT DETECTED. The SARS-CoV-2 RNA is generally detectable in upper and lower respiratory specimens during the acute phase of infection. Negative results do not preclude SARS-CoV-2 infection, do not rule out co-infections with other pathogens, and should not be used as the sole basis for treatment or other patient management decisions. Negative results must be combined with clinical observations, patient history, and epidemiological information. The expected result is Negative. Fact Sheet for Patients: SugarRoll.be Fact Sheet for Healthcare Providers: https://www.woods-mathews.com/ This test is not yet approved or cleared by the [place] FDA and  has been authorized for detection and/or diagnosis of SARS-CoV-2 by FDA  under an Emergency Use Authorization (EUA). This EUA will remain  in effect (meaning this test can be used) for the duration of the COVID-19 declaration under Section 56 4(b)(1) of the Act, 21 U.S.C. section 360bbb-3(b)(1), unless the authorization is terminated or revoked sooner. Performed at [facility], [place] [address], [place], [place] [address]   Culture, blood (routine x 2)  Status: None (Preliminary result)   Collection Time: 03/08/20  8:33 AM   Specimen: BLOOD  Result Value Ref Range Status   Specimen Description BLOOD RIGHT ANTECUBITAL  Final   Special Requests   Final    BOTTLES DRAWN AEROBIC ONLY Blood Culture adequate volume   Culture   Final    NO GROWTH 3 DAYS Performed at [facility], [address] [place], [place] [address]    Report Status PENDING  Incomplete  Culture, blood (routine x 2)     Status: None (Preliminary result)   Collection Time: 03/08/20  8:39 AM   Specimen: BLOOD LEFT HAND  Result Value Ref Range Status   Specimen Description BLOOD LEFT HAND  Final   Special Requests   Final    BOTTLES DRAWN AEROBIC ONLY Blood Culture results may not be optimal due to an inadequate volume of blood received in culture bottles   Culture   Final    NO GROWTH 3 DAYS Performed at [facility], [place] [address], [place], [place] [address]    Report Status PENDING  Incomplete     Labs: BNP (last 3 results) Recent Labs    11/24/19 1412  BNP 93.8   Basic Metabolic Panel: Recent Labs  Lab 03/06/20 0104 03/06/20 0322 03/07/20 0449 03/08/20 0833 03/09/20 0504 03/10/20 0713 03/11/20 0344  NA  --    < > 140 138 139 138 136  K  --    < > 4.1 4.1 4.3 4.2 4.5  CL  --    < > 113* 111 112* 112* 110  CO2  --    < > 18* 18* 17* 18* 12*  GLUCOSE  --    < > 99 122* 98 100* 117*  BUN  --    < > 25* 20 24* 19 22  CREATININE  --    < > 2.32* 1.93* 2.03* 1.88* 1.93*  CALCIUM  --    < > 8.2* 8.4* 8.2* 8.5* 8.5*  MG 1.6*  --   --   --   --   --    --    < > = values in this interval not displayed.   Liver Function Tests: Recent Labs  Lab 03/07/20 0449 03/08/20 0833 03/09/20 0504 03/10/20 0713 03/11/20 0344  AST 50* 36 44* 69* 55*  ALT 115* 91* 76* 78* 69*  ALKPHOS 91 94 80 80 67  BILITOT 0.8 0.9 0.6 1.2 1.0  PROT 5.5* 6.2* 5.5* 6.2* 5.8*  ALBUMIN 2.3* 2.5* 2.5* 2.8* 2.6*   Recent Labs  Lab 03/05/20 1604  LIPASE 35   Recent Labs  Lab 03/05/20 1604  AMMONIA 38*   CBC: Recent Labs  Lab 03/05/20 1340 03/07/20 0449 03/08/20 0833 03/09/20 0504 03/10/20 0713 03/11/20 0344 03/12/20 0450  WBC 8.6   < > 4.1 4.2 4.8 6.4 6.7  NEUTROABS 7.5  --   --   --   --   --   --   HGB 15.1   < > 13.0 12.2* 12.7* 12.1* 12.1*  HCT 47.3   < > 41.0 37.4* 39.3 37.3* 37.9*  MCV 89.8   < > 91.1 89.3 88.3 88.2 89.0  PLT 176   < > 154 163 201 199 220   < > = values in this interval not displayed.   Cardiac Enzymes: No results for input(s): CKTOTAL, CKMB, CKMBINDEX, TROPONINI in the last 168 hours. BNP: Invalid input(s): POCBNP CBG: Recent Labs  Lab 03/11/20 0709 03/11/20 1119 03/11/20 1617 03/11/20 2129 03/12/20 0648  GLUCAP 79 110* 106* 157* 89   D-Dimer No results for input(s): DDIMER in the last 72 hours. Hgb A1c No results for input(s): HGBA1C in the last 72 hours. Lipid Profile No results for input(s): CHOL, HDL, LDLCALC, TRIG, CHOLHDL, LDLDIRECT in the last 72 hours. Thyroid function studies No results for input(s): TSH, T4TOTAL, T3FREE, THYROIDAB in the last 72 hours.  Invalid input(s): FREET3 Anemia work up No results for input(s): VITAMINB12, FOLATE, FERRITIN, TIBC, IRON, RETICCTPCT in the last 72 hours. Urinalysis    Component Value Date/Time   COLORURINE YELLOW 03/05/2020 1830   APPEARANCEUR CLEAR 03/05/2020 1830   LABSPEC 1.011 03/05/2020 1830   PHURINE 5.0 03/05/2020 1830   GLUCOSEU NEGATIVE 03/05/2020 1830   HGBUR NEGATIVE 03/05/2020 1830   BILIRUBINUR NEGATIVE 03/05/2020 1830   KETONESUR  NEGATIVE 03/05/2020 1830   PROTEINUR NEGATIVE 03/05/2020 1830   UROBILINOGEN 0.2 09/23/2008 0237   NITRITE NEGATIVE 03/05/2020 1830   LEUKOCYTESUR NEGATIVE 03/05/2020 1830   Sepsis Labs Invalid input(s): PROCALCITONIN,  WBC,  LACTICIDVEN Microbiology Recent Results (from the past 240 hour(s))  Culture, blood (Routine x 2)     Status: None   Collection Time: 03/05/20  1:40 PM   Specimen: BLOOD  Result Value Ref Range Status   Specimen Description BLOOD SITE NOT SPECIFIED  Final   Special Requests   Final    BOTTLES DRAWN AEROBIC AND ANAEROBIC Blood Culture adequate volume   Culture   Final    NO GROWTH 5 DAYS Performed at [facility], [address], [place], [place] [address]    Report Status 03/10/2020 FINAL  Final  Culture, blood (Routine x 2)     Status: Abnormal   Collection Time: 03/05/20  4:04 PM   Specimen: BLOOD  Result Value Ref Range Status   Specimen Description BLOOD  Final   Special Requests   Final    BOTTLES DRAWN AEROBIC AND ANAEROBIC Blood Culture adequate volume   Culture  Setup Time   Final    ANAEROBIC BOTTLE ONLY GRAM POSITIVE COCCI IN CHAINS CRITICAL RESULT CALLED TO, READ BACK BY AND VERIFIED WITH: J LEDFORD PHARMD 03/06/20 2357 JDW    Culture (A)  Final    VIRIDANS STREPTOCOCCUS THE SIGNIFICANCE OF ISOLATING THIS ORGANISM FROM A SINGLE SET OF BLOOD CULTURES WHEN MULTIPLE SETS ARE DRAWN IS UNCERTAIN. PLEASE NOTIFY THE MICROBIOLOGY DEPARTMENT WITHIN ONE WEEK IF SPECIATION AND SENSITIVITIES ARE REQUIRED. Performed at [facility], [place] [address], [place], [place] [address]    Report Status 03/08/2020 FINAL  Final  Blood Culture ID Panel (Reflexed)     Status: Abnormal   Collection Time: 03/05/20  4:04 PM  Result Value Ref Range Status   Enterococcus species NOT DETECTED NOT DETECTED Final   Listeria monocytogenes NOT DETECTED NOT DETECTED Final   Staphylococcus species NOT DETECTED NOT DETECTED Final   Staphylococcus aureus (BCID) NOT  DETECTED NOT DETECTED Final   Streptococcus species DETECTED (A) NOT DETECTED Final    Comment: Not Enterococcus species, Streptococcus agalactiae, Streptococcus pyogenes, or Streptococcus pneumoniae. CRITICAL RESULT CALLED TO, READ BACK BY AND VERIFIED WITH: J LEDFORD PHARMD 03/06/20 2357 JDW    Streptococcus agalactiae NOT DETECTED NOT DETECTED Final   Streptococcus pneumoniae NOT DETECTED NOT DETECTED Final   Streptococcus pyogenes NOT DETECTED NOT DETECTED Final   Acinetobacter baumannii NOT DETECTED NOT DETECTED Final   Enterobacteriaceae species NOT DETECTED NOT DETECTED Final  Enterobacter cloacae complex NOT DETECTED NOT DETECTED Final   Escherichia coli NOT DETECTED NOT DETECTED Final   Klebsiella oxytoca NOT DETECTED NOT DETECTED Final   Klebsiella pneumoniae NOT DETECTED NOT DETECTED Final   Proteus species NOT DETECTED NOT DETECTED Final   Serratia marcescens NOT DETECTED NOT DETECTED Final   Haemophilus influenzae NOT DETECTED NOT DETECTED Final   Neisseria meningitidis NOT DETECTED NOT DETECTED Final   Pseudomonas aeruginosa NOT DETECTED NOT DETECTED Final   Candida albicans NOT DETECTED NOT DETECTED Final   Candida glabrata NOT DETECTED NOT DETECTED Final   Candida krusei NOT DETECTED NOT DETECTED Final   Candida parapsilosis NOT DETECTED NOT DETECTED Final   Candida tropicalis NOT DETECTED NOT DETECTED Final    Comment: Performed at [facility], [place] [address] [place], [place] [address]  Urine culture     Status: Abnormal   Collection Time: 03/05/20  6:36 PM   Specimen: Urine, Clean Catch  Result Value Ref Range Status   Specimen Description URINE, CLEAN CATCH  Final   Special Requests   Final    NONE Performed at [facility], [place] [address], [place], [place] [address]    Culture MULTIPLE SPECIES PRESENT, SUGGEST RECOLLECTION (A)  Final   Report Status 03/07/2020 FINAL  Final  SARS CORONAVIRUS 2 (TAT 6-24 HRS) Nasopharyngeal Nasopharyngeal Swab      Status: None   Collection Time: 03/05/20  7:00 PM   Specimen: Nasopharyngeal Swab  Result Value Ref Range Status   SARS Coronavirus 2 NEGATIVE NEGATIVE Final    Comment: (NOTE) SARS-CoV-2 target nucleic acids are NOT DETECTED. The SARS-CoV-2 RNA is generally detectable in upper and lower respiratory specimens during the acute phase of infection. Negative results do not preclude SARS-CoV-2 infection, do not rule out co-infections with other pathogens, and should not be used as the sole basis for treatment or other patient management decisions. Negative results must be combined with clinical observations, patient history, and epidemiological information. The expected result is Negative. Fact Sheet for Patients: SugarRoll.be Fact Sheet for Healthcare Providers: https://www.woods-mathews.com/ This test is not yet approved or cleared by the [place] FDA and  has been authorized for detection and/or diagnosis of SARS-CoV-2 by FDA under an Emergency Use Authorization (EUA). This EUA will remain  in effect (meaning this test can be used) for the duration of the COVID-19 declaration under Section 56 4(b)(1) of the Act, 21 U.S.C. section 360bbb-3(b)(1), unless the authorization is terminated or revoked sooner. Performed at [facility], [place] [address], [place], [place] [address]   Culture, blood (routine x 2)     Status: None (Preliminary result)   Collection Time: 03/08/20  8:33 AM   Specimen: BLOOD  Result Value Ref Range Status   Specimen Description BLOOD RIGHT ANTECUBITAL  Final   Special Requests   Final    BOTTLES DRAWN AEROBIC ONLY Blood Culture adequate volume   Culture   Final    NO GROWTH 3 DAYS Performed at [facility], [place] [address], [place],  [address]    Report Status PENDING  Incomplete  Culture, blood (routine x 2)     Status: None (Preliminary result)   Collection Time: 03/08/20  8:39 AM   Specimen:  BLOOD LEFT HAND  Result Value Ref Range Status   Specimen Description BLOOD LEFT HAND  Final   Special Requests   Final    BOTTLES DRAWN AEROBIC ONLY Blood Culture results may not be  optimal due to an inadequate volume of blood received in culture bottles   Culture   Final    NO GROWTH 3 DAYS Performed at [facility], [place] [address], [place], [place] [address]    Report Status PENDING  Incomplete     Time coordinating discharge: Over 30 minutes  SIGNED:   Little Ishikawa, DO [facility] 03/12/2020, 7:18 AM Pager   If 7PM-7AM, please contact night-coverage [email or web address]

## 2020-03-12 NOTE — TOC Transition Note (Signed)
Transition of Care [facility]) - CM/SW Discharge Note   Patient Details  Name: Tony Fox MRN: [id number] Date of Birth: 03-29-1941  Transition [facility] Care [facility]) CM/SW Contact:  Bartholomew Crews, RN Phone Number: [phone] 03/12/2020, 9:01 AM   Clinical Narrative:     Patient to transition home today. HH orders provided. Liaison at [facility] notified. Spoke with wife on the phone, reviewed medication changes and discharge prescription. Home address verified. Spoke with nurse about readiness to call ambulance transport. [facility] notified. No further TOC needs identified.   Final next level of care: Home w Home Health Services Barriers to Discharge: No Barriers Identified   Patient Goals and CMS Choice   CMS Medicare.gov Compare Post Acute Care list provided to:: Patient Represenative (must comment) Choice offered to / list presented to : Spouse  Discharge Placement                       Discharge Plan and Services In-house Referral: Clinical Social Work Discharge Planning Services: CM Consult Post Acute Care Choice: Durable Medical Equipment          DME Arranged: Other see comment(hoyer lift) DME Agency: [employer] Date DME Agency Contacted: 03/11/20 Time DME Agency Contacted: 1600 Representative spoke with at DME Agency: Thedore Mins HH Arranged: RN, PT, OT, Nurse's Aide, Social Work [employer] Agency: [facility] [employer] [facility] [employer] Date [place]: 03/12/20 Time [place]: 0830 Representative spoke with at [place]: [place] ([place]) Interventions     Readmission Risk Interventions Readmission Risk Prevention Plan 03/09/2020  Transportation Screening Complete  Medication Review Press photographer) Complete  SW Recovery Care/Counseling Consult Not Complete  Some recent data might be hidden

## 2020-03-13 DIAGNOSIS — A419 Sepsis, unspecified organism: Secondary | ICD-10-CM | POA: Diagnosis not present

## 2020-03-13 DIAGNOSIS — F0150 Vascular dementia without behavioral disturbance: Secondary | ICD-10-CM | POA: Diagnosis not present

## 2020-03-13 LAB — CULTURE, BLOOD (ROUTINE X 2)
Culture: NO GROWTH
Culture: NO GROWTH
Special Requests: ADEQUATE

## 2020-03-14 DIAGNOSIS — F0280 Dementia in other diseases classified elsewhere without behavioral disturbance: Secondary | ICD-10-CM | POA: Diagnosis not present

## 2020-03-14 DIAGNOSIS — I82411 Acute embolism and thrombosis of right femoral vein: Secondary | ICD-10-CM | POA: Diagnosis not present

## 2020-03-14 DIAGNOSIS — G309 Alzheimer's disease, unspecified: Secondary | ICD-10-CM | POA: Diagnosis not present

## 2020-03-14 DIAGNOSIS — F0150 Vascular dementia without behavioral disturbance: Secondary | ICD-10-CM | POA: Diagnosis not present

## 2020-03-14 DIAGNOSIS — I152 Hypertension secondary to endocrine disorders: Secondary | ICD-10-CM | POA: Diagnosis not present

## 2020-03-14 DIAGNOSIS — E1122 Type 2 diabetes mellitus with diabetic chronic kidney disease: Secondary | ICD-10-CM | POA: Diagnosis not present

## 2020-03-14 DIAGNOSIS — N1832 Chronic kidney disease, stage 3b: Secondary | ICD-10-CM | POA: Diagnosis not present

## 2020-03-14 DIAGNOSIS — N390 Urinary tract infection, site not specified: Secondary | ICD-10-CM | POA: Diagnosis not present

## 2020-03-14 DIAGNOSIS — E1159 Type 2 diabetes mellitus with other circulatory complications: Secondary | ICD-10-CM | POA: Diagnosis not present

## 2020-03-18 DIAGNOSIS — E1159 Type 2 diabetes mellitus with other circulatory complications: Secondary | ICD-10-CM | POA: Diagnosis not present

## 2020-03-18 DIAGNOSIS — I152 Hypertension secondary to endocrine disorders: Secondary | ICD-10-CM | POA: Diagnosis not present

## 2020-03-18 DIAGNOSIS — N1832 Chronic kidney disease, stage 3b: Secondary | ICD-10-CM | POA: Diagnosis not present

## 2020-03-18 DIAGNOSIS — E1122 Type 2 diabetes mellitus with diabetic chronic kidney disease: Secondary | ICD-10-CM | POA: Diagnosis not present

## 2020-03-18 DIAGNOSIS — I82411 Acute embolism and thrombosis of right femoral vein: Secondary | ICD-10-CM | POA: Diagnosis not present

## 2020-03-18 DIAGNOSIS — G309 Alzheimer's disease, unspecified: Secondary | ICD-10-CM | POA: Diagnosis not present

## 2020-03-18 DIAGNOSIS — F0150 Vascular dementia without behavioral disturbance: Secondary | ICD-10-CM | POA: Diagnosis not present

## 2020-03-18 DIAGNOSIS — N390 Urinary tract infection, site not specified: Secondary | ICD-10-CM | POA: Diagnosis not present

## 2020-03-18 DIAGNOSIS — F0280 Dementia in other diseases classified elsewhere without behavioral disturbance: Secondary | ICD-10-CM | POA: Diagnosis not present

## 2020-03-19 DIAGNOSIS — E1122 Type 2 diabetes mellitus with diabetic chronic kidney disease: Secondary | ICD-10-CM | POA: Diagnosis not present

## 2020-03-19 DIAGNOSIS — G309 Alzheimer's disease, unspecified: Secondary | ICD-10-CM | POA: Diagnosis not present

## 2020-03-19 DIAGNOSIS — F0280 Dementia in other diseases classified elsewhere without behavioral disturbance: Secondary | ICD-10-CM | POA: Diagnosis not present

## 2020-03-19 DIAGNOSIS — I82411 Acute embolism and thrombosis of right femoral vein: Secondary | ICD-10-CM | POA: Diagnosis not present

## 2020-03-19 DIAGNOSIS — N1832 Chronic kidney disease, stage 3b: Secondary | ICD-10-CM | POA: Diagnosis not present

## 2020-03-19 DIAGNOSIS — N390 Urinary tract infection, site not specified: Secondary | ICD-10-CM | POA: Diagnosis not present

## 2020-03-19 DIAGNOSIS — E1159 Type 2 diabetes mellitus with other circulatory complications: Secondary | ICD-10-CM | POA: Diagnosis not present

## 2020-03-19 DIAGNOSIS — I152 Hypertension secondary to endocrine disorders: Secondary | ICD-10-CM | POA: Diagnosis not present

## 2020-03-19 DIAGNOSIS — F0150 Vascular dementia without behavioral disturbance: Secondary | ICD-10-CM | POA: Diagnosis not present

## 2020-03-20 DIAGNOSIS — I152 Hypertension secondary to endocrine disorders: Secondary | ICD-10-CM | POA: Diagnosis not present

## 2020-03-20 DIAGNOSIS — N1832 Chronic kidney disease, stage 3b: Secondary | ICD-10-CM | POA: Diagnosis not present

## 2020-03-20 DIAGNOSIS — E1122 Type 2 diabetes mellitus with diabetic chronic kidney disease: Secondary | ICD-10-CM | POA: Diagnosis not present

## 2020-03-20 DIAGNOSIS — F0150 Vascular dementia without behavioral disturbance: Secondary | ICD-10-CM | POA: Diagnosis not present

## 2020-03-20 DIAGNOSIS — I82411 Acute embolism and thrombosis of right femoral vein: Secondary | ICD-10-CM | POA: Diagnosis not present

## 2020-03-20 DIAGNOSIS — F0280 Dementia in other diseases classified elsewhere without behavioral disturbance: Secondary | ICD-10-CM | POA: Diagnosis not present

## 2020-03-20 DIAGNOSIS — G309 Alzheimer's disease, unspecified: Secondary | ICD-10-CM | POA: Diagnosis not present

## 2020-03-20 DIAGNOSIS — E1159 Type 2 diabetes mellitus with other circulatory complications: Secondary | ICD-10-CM | POA: Diagnosis not present

## 2020-03-20 DIAGNOSIS — N390 Urinary tract infection, site not specified: Secondary | ICD-10-CM | POA: Diagnosis not present

## 2020-03-24 DIAGNOSIS — I152 Hypertension secondary to endocrine disorders: Secondary | ICD-10-CM | POA: Diagnosis not present

## 2020-03-24 DIAGNOSIS — N390 Urinary tract infection, site not specified: Secondary | ICD-10-CM | POA: Diagnosis not present

## 2020-03-24 DIAGNOSIS — F0280 Dementia in other diseases classified elsewhere without behavioral disturbance: Secondary | ICD-10-CM | POA: Diagnosis not present

## 2020-03-24 DIAGNOSIS — F0150 Vascular dementia without behavioral disturbance: Secondary | ICD-10-CM | POA: Diagnosis not present

## 2020-03-24 DIAGNOSIS — E1122 Type 2 diabetes mellitus with diabetic chronic kidney disease: Secondary | ICD-10-CM | POA: Diagnosis not present

## 2020-03-24 DIAGNOSIS — G309 Alzheimer's disease, unspecified: Secondary | ICD-10-CM | POA: Diagnosis not present

## 2020-03-24 DIAGNOSIS — N1832 Chronic kidney disease, stage 3b: Secondary | ICD-10-CM | POA: Diagnosis not present

## 2020-03-24 DIAGNOSIS — E1159 Type 2 diabetes mellitus with other circulatory complications: Secondary | ICD-10-CM | POA: Diagnosis not present

## 2020-03-24 DIAGNOSIS — I82411 Acute embolism and thrombosis of right femoral vein: Secondary | ICD-10-CM | POA: Diagnosis not present

## 2020-03-27 DIAGNOSIS — G309 Alzheimer's disease, unspecified: Secondary | ICD-10-CM | POA: Diagnosis not present

## 2020-03-27 DIAGNOSIS — F0280 Dementia in other diseases classified elsewhere without behavioral disturbance: Secondary | ICD-10-CM | POA: Diagnosis not present

## 2020-03-27 DIAGNOSIS — N390 Urinary tract infection, site not specified: Secondary | ICD-10-CM | POA: Diagnosis not present

## 2020-03-27 DIAGNOSIS — F0150 Vascular dementia without behavioral disturbance: Secondary | ICD-10-CM | POA: Diagnosis not present

## 2020-03-27 DIAGNOSIS — I82411 Acute embolism and thrombosis of right femoral vein: Secondary | ICD-10-CM | POA: Diagnosis not present

## 2020-03-27 DIAGNOSIS — N1832 Chronic kidney disease, stage 3b: Secondary | ICD-10-CM | POA: Diagnosis not present

## 2020-03-27 DIAGNOSIS — E1159 Type 2 diabetes mellitus with other circulatory complications: Secondary | ICD-10-CM | POA: Diagnosis not present

## 2020-03-27 DIAGNOSIS — E1122 Type 2 diabetes mellitus with diabetic chronic kidney disease: Secondary | ICD-10-CM | POA: Diagnosis not present

## 2020-03-27 DIAGNOSIS — I152 Hypertension secondary to endocrine disorders: Secondary | ICD-10-CM | POA: Diagnosis not present

## 2020-03-30 DIAGNOSIS — N1832 Chronic kidney disease, stage 3b: Secondary | ICD-10-CM | POA: Diagnosis not present

## 2020-03-30 DIAGNOSIS — F0150 Vascular dementia without behavioral disturbance: Secondary | ICD-10-CM | POA: Diagnosis not present

## 2020-03-30 DIAGNOSIS — F0280 Dementia in other diseases classified elsewhere without behavioral disturbance: Secondary | ICD-10-CM | POA: Diagnosis not present

## 2020-03-30 DIAGNOSIS — E1159 Type 2 diabetes mellitus with other circulatory complications: Secondary | ICD-10-CM | POA: Diagnosis not present

## 2020-03-30 DIAGNOSIS — N390 Urinary tract infection, site not specified: Secondary | ICD-10-CM | POA: Diagnosis not present

## 2020-03-30 DIAGNOSIS — I152 Hypertension secondary to endocrine disorders: Secondary | ICD-10-CM | POA: Diagnosis not present

## 2020-03-30 DIAGNOSIS — G309 Alzheimer's disease, unspecified: Secondary | ICD-10-CM | POA: Diagnosis not present

## 2020-03-30 DIAGNOSIS — I82411 Acute embolism and thrombosis of right femoral vein: Secondary | ICD-10-CM | POA: Diagnosis not present

## 2020-03-30 DIAGNOSIS — E1122 Type 2 diabetes mellitus with diabetic chronic kidney disease: Secondary | ICD-10-CM | POA: Diagnosis not present

## 2020-03-31 DIAGNOSIS — G309 Alzheimer's disease, unspecified: Secondary | ICD-10-CM | POA: Diagnosis not present

## 2020-03-31 DIAGNOSIS — E1122 Type 2 diabetes mellitus with diabetic chronic kidney disease: Secondary | ICD-10-CM | POA: Diagnosis not present

## 2020-03-31 DIAGNOSIS — I82411 Acute embolism and thrombosis of right femoral vein: Secondary | ICD-10-CM | POA: Diagnosis not present

## 2020-03-31 DIAGNOSIS — E1159 Type 2 diabetes mellitus with other circulatory complications: Secondary | ICD-10-CM | POA: Diagnosis not present

## 2020-03-31 DIAGNOSIS — F0280 Dementia in other diseases classified elsewhere without behavioral disturbance: Secondary | ICD-10-CM | POA: Diagnosis not present

## 2020-03-31 DIAGNOSIS — I152 Hypertension secondary to endocrine disorders: Secondary | ICD-10-CM | POA: Diagnosis not present

## 2020-03-31 DIAGNOSIS — N390 Urinary tract infection, site not specified: Secondary | ICD-10-CM | POA: Diagnosis not present

## 2020-03-31 DIAGNOSIS — F0150 Vascular dementia without behavioral disturbance: Secondary | ICD-10-CM | POA: Diagnosis not present

## 2020-03-31 DIAGNOSIS — N1832 Chronic kidney disease, stage 3b: Secondary | ICD-10-CM | POA: Diagnosis not present

## 2020-04-02 ENCOUNTER — Other Ambulatory Visit: Payer: Self-pay | Admitting: Internal Medicine

## 2020-04-02 DIAGNOSIS — M5416 Radiculopathy, lumbar region: Secondary | ICD-10-CM

## 2020-04-06 DIAGNOSIS — E1159 Type 2 diabetes mellitus with other circulatory complications: Secondary | ICD-10-CM | POA: Diagnosis not present

## 2020-04-06 DIAGNOSIS — F0150 Vascular dementia without behavioral disturbance: Secondary | ICD-10-CM | POA: Diagnosis not present

## 2020-04-06 DIAGNOSIS — E1122 Type 2 diabetes mellitus with diabetic chronic kidney disease: Secondary | ICD-10-CM | POA: Diagnosis not present

## 2020-04-06 DIAGNOSIS — N1832 Chronic kidney disease, stage 3b: Secondary | ICD-10-CM | POA: Diagnosis not present

## 2020-04-06 DIAGNOSIS — G309 Alzheimer's disease, unspecified: Secondary | ICD-10-CM | POA: Diagnosis not present

## 2020-04-06 DIAGNOSIS — N390 Urinary tract infection, site not specified: Secondary | ICD-10-CM | POA: Diagnosis not present

## 2020-04-06 DIAGNOSIS — I152 Hypertension secondary to endocrine disorders: Secondary | ICD-10-CM | POA: Diagnosis not present

## 2020-04-06 DIAGNOSIS — I82411 Acute embolism and thrombosis of right femoral vein: Secondary | ICD-10-CM | POA: Diagnosis not present

## 2020-04-06 DIAGNOSIS — F0280 Dementia in other diseases classified elsewhere without behavioral disturbance: Secondary | ICD-10-CM | POA: Diagnosis not present

## 2020-04-07 DIAGNOSIS — E1122 Type 2 diabetes mellitus with diabetic chronic kidney disease: Secondary | ICD-10-CM | POA: Diagnosis not present

## 2020-04-07 DIAGNOSIS — F0280 Dementia in other diseases classified elsewhere without behavioral disturbance: Secondary | ICD-10-CM | POA: Diagnosis not present

## 2020-04-07 DIAGNOSIS — N390 Urinary tract infection, site not specified: Secondary | ICD-10-CM | POA: Diagnosis not present

## 2020-04-07 DIAGNOSIS — F0150 Vascular dementia without behavioral disturbance: Secondary | ICD-10-CM | POA: Diagnosis not present

## 2020-04-07 DIAGNOSIS — I82411 Acute embolism and thrombosis of right femoral vein: Secondary | ICD-10-CM | POA: Diagnosis not present

## 2020-04-07 DIAGNOSIS — G309 Alzheimer's disease, unspecified: Secondary | ICD-10-CM | POA: Diagnosis not present

## 2020-04-07 DIAGNOSIS — I152 Hypertension secondary to endocrine disorders: Secondary | ICD-10-CM | POA: Diagnosis not present

## 2020-04-07 DIAGNOSIS — N1832 Chronic kidney disease, stage 3b: Secondary | ICD-10-CM | POA: Diagnosis not present

## 2020-04-07 DIAGNOSIS — E1159 Type 2 diabetes mellitus with other circulatory complications: Secondary | ICD-10-CM | POA: Diagnosis not present

## 2020-04-12 DIAGNOSIS — A419 Sepsis, unspecified organism: Secondary | ICD-10-CM | POA: Diagnosis not present

## 2020-04-12 DIAGNOSIS — F0150 Vascular dementia without behavioral disturbance: Secondary | ICD-10-CM | POA: Diagnosis not present

## 2020-04-13 DIAGNOSIS — F0150 Vascular dementia without behavioral disturbance: Secondary | ICD-10-CM | POA: Diagnosis not present

## 2020-04-13 DIAGNOSIS — F0280 Dementia in other diseases classified elsewhere without behavioral disturbance: Secondary | ICD-10-CM | POA: Diagnosis not present

## 2020-04-13 DIAGNOSIS — N390 Urinary tract infection, site not specified: Secondary | ICD-10-CM | POA: Diagnosis not present

## 2020-04-13 DIAGNOSIS — I152 Hypertension secondary to endocrine disorders: Secondary | ICD-10-CM | POA: Diagnosis not present

## 2020-04-13 DIAGNOSIS — E1159 Type 2 diabetes mellitus with other circulatory complications: Secondary | ICD-10-CM | POA: Diagnosis not present

## 2020-04-13 DIAGNOSIS — E1122 Type 2 diabetes mellitus with diabetic chronic kidney disease: Secondary | ICD-10-CM | POA: Diagnosis not present

## 2020-04-13 DIAGNOSIS — N1832 Chronic kidney disease, stage 3b: Secondary | ICD-10-CM | POA: Diagnosis not present

## 2020-04-13 DIAGNOSIS — G309 Alzheimer's disease, unspecified: Secondary | ICD-10-CM | POA: Diagnosis not present

## 2020-04-13 DIAGNOSIS — I82411 Acute embolism and thrombosis of right femoral vein: Secondary | ICD-10-CM | POA: Diagnosis not present

## 2020-04-14 DIAGNOSIS — F0150 Vascular dementia without behavioral disturbance: Secondary | ICD-10-CM | POA: Diagnosis not present

## 2020-04-14 DIAGNOSIS — I82411 Acute embolism and thrombosis of right femoral vein: Secondary | ICD-10-CM | POA: Diagnosis not present

## 2020-04-14 DIAGNOSIS — E1159 Type 2 diabetes mellitus with other circulatory complications: Secondary | ICD-10-CM | POA: Diagnosis not present

## 2020-04-14 DIAGNOSIS — G309 Alzheimer's disease, unspecified: Secondary | ICD-10-CM | POA: Diagnosis not present

## 2020-04-14 DIAGNOSIS — F0280 Dementia in other diseases classified elsewhere without behavioral disturbance: Secondary | ICD-10-CM | POA: Diagnosis not present

## 2020-04-14 DIAGNOSIS — N390 Urinary tract infection, site not specified: Secondary | ICD-10-CM | POA: Diagnosis not present

## 2020-04-14 DIAGNOSIS — I152 Hypertension secondary to endocrine disorders: Secondary | ICD-10-CM | POA: Diagnosis not present

## 2020-04-14 DIAGNOSIS — E1122 Type 2 diabetes mellitus with diabetic chronic kidney disease: Secondary | ICD-10-CM | POA: Diagnosis not present

## 2020-04-14 DIAGNOSIS — N1832 Chronic kidney disease, stage 3b: Secondary | ICD-10-CM | POA: Diagnosis not present

## 2020-04-21 ENCOUNTER — Other Ambulatory Visit (HOSPITAL_COMMUNITY): Payer: Self-pay | Admitting: Internal Medicine

## 2020-04-21 DIAGNOSIS — M5416 Radiculopathy, lumbar region: Secondary | ICD-10-CM

## 2020-04-24 DIAGNOSIS — I82411 Acute embolism and thrombosis of right femoral vein: Secondary | ICD-10-CM | POA: Diagnosis not present

## 2020-04-24 DIAGNOSIS — G309 Alzheimer's disease, unspecified: Secondary | ICD-10-CM | POA: Diagnosis not present

## 2020-04-24 DIAGNOSIS — N1832 Chronic kidney disease, stage 3b: Secondary | ICD-10-CM | POA: Diagnosis not present

## 2020-04-24 DIAGNOSIS — E1122 Type 2 diabetes mellitus with diabetic chronic kidney disease: Secondary | ICD-10-CM | POA: Diagnosis not present

## 2020-04-24 DIAGNOSIS — I152 Hypertension secondary to endocrine disorders: Secondary | ICD-10-CM | POA: Diagnosis not present

## 2020-04-24 DIAGNOSIS — N390 Urinary tract infection, site not specified: Secondary | ICD-10-CM | POA: Diagnosis not present

## 2020-04-24 DIAGNOSIS — F0150 Vascular dementia without behavioral disturbance: Secondary | ICD-10-CM | POA: Diagnosis not present

## 2020-04-24 DIAGNOSIS — F0280 Dementia in other diseases classified elsewhere without behavioral disturbance: Secondary | ICD-10-CM | POA: Diagnosis not present

## 2020-04-24 DIAGNOSIS — E1159 Type 2 diabetes mellitus with other circulatory complications: Secondary | ICD-10-CM | POA: Diagnosis not present

## 2020-04-27 IMAGING — CT CT ABD-PELV W/O CM
2 of 4 series · 15 of 46 positions shown, 17 images · non-contrast
Comparison: CT, 02/12/2018

CLINICAL DATA: 78-year-old male with fever of 101.4, suspected
sepsis. past medical history significant for bladder cancer,
diabetes, hyperlipidemia, prior DVT on Eliquis, and sleep apnea who
presents for fatigue, altered mental status, reported dysuria, and
fever. According to EMS, patient was difficult to arouse this
morning and not acting normal. They found him to have a fever and
patient was complaining of some urinary symptoms. They brought him
to evaluation to the emergency department and was found to have
fever and tachycardia.

EXAM:
CT ABDOMEN AND PELVIS WITHOUT CONTRAST
TECHNIQUE: Multidetector CT imaging of the abdomen and pelvis was performed
following the standard protocol without IV contrast.

[Series 2: abd/ pelvis 5.0 i30f 2 · axial · 0.96mm/px · z∈[+887,+1342]mm · 12 of 109 slices shown, 14 images]
[im 9/109  soft-tissue]
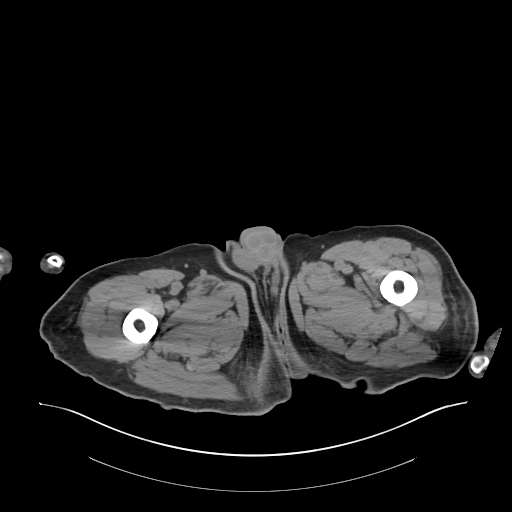
[im 9/109  bone]
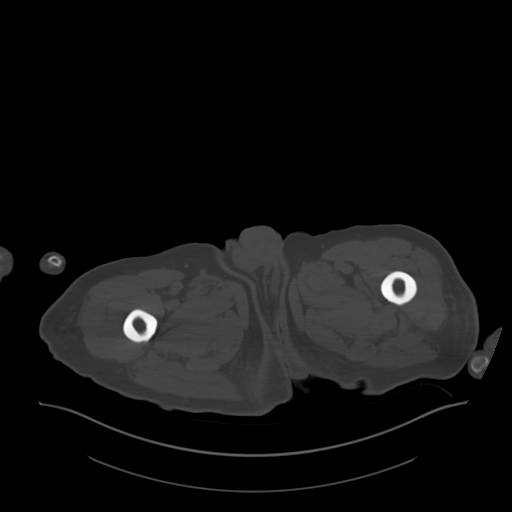
[im 17/109  soft-tissue]
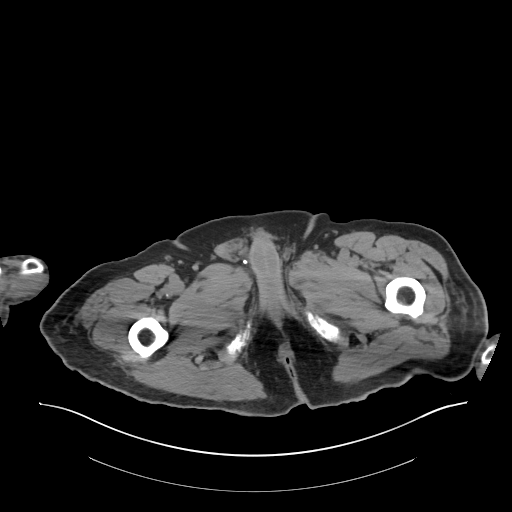
[im 25/109  soft-tissue]
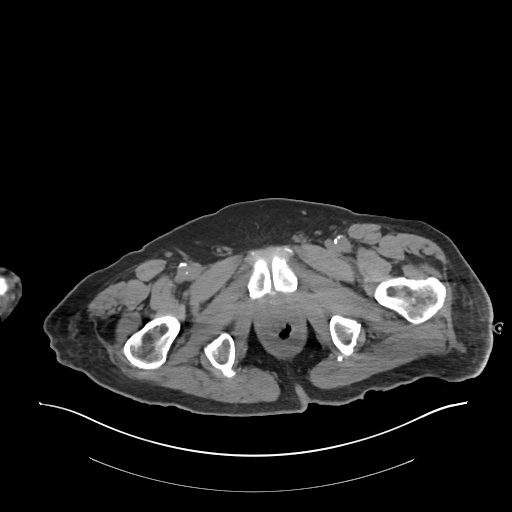
[im 34/109  soft-tissue]
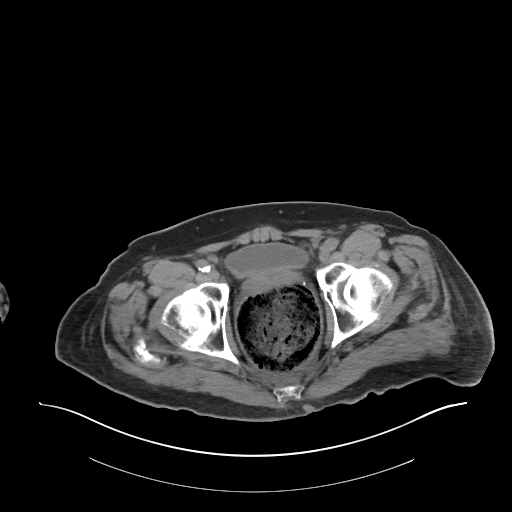
[im 42/109  soft-tissue]
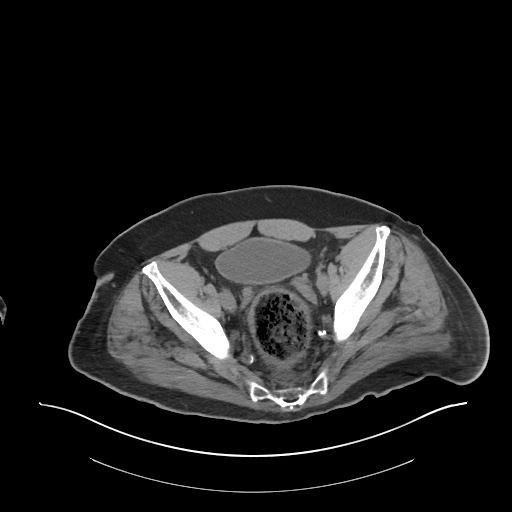
[im 50/109  soft-tissue]
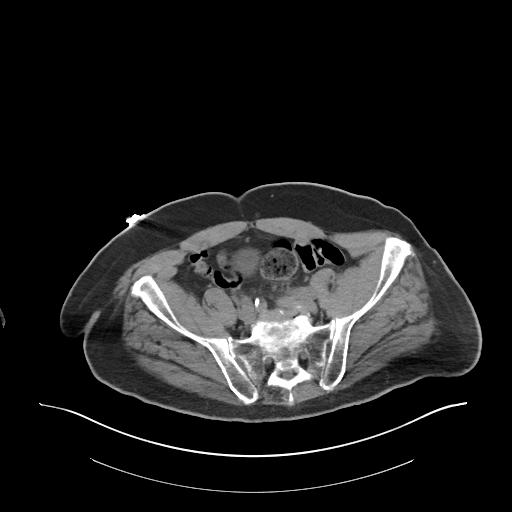
[im 59/109  soft-tissue]
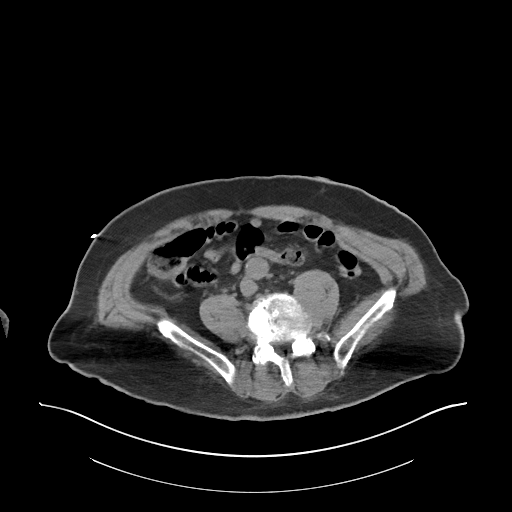
[im 67/109  soft-tissue]
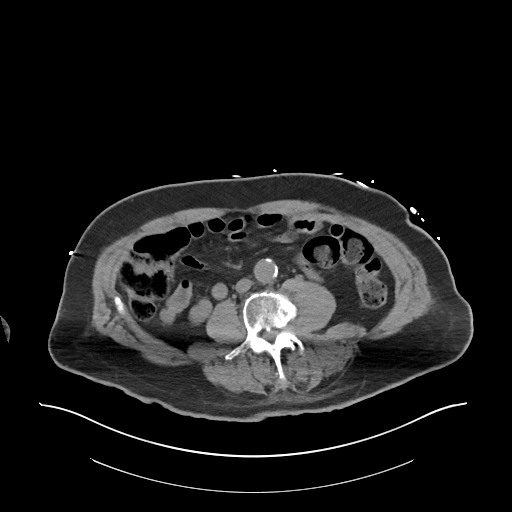
[im 75/109  soft-tissue]
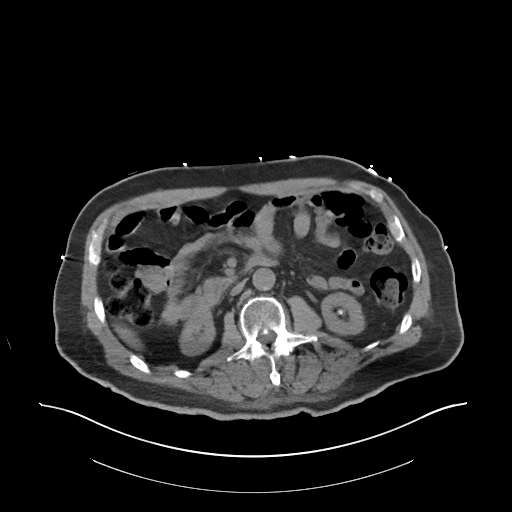
[im 75/109  bone]
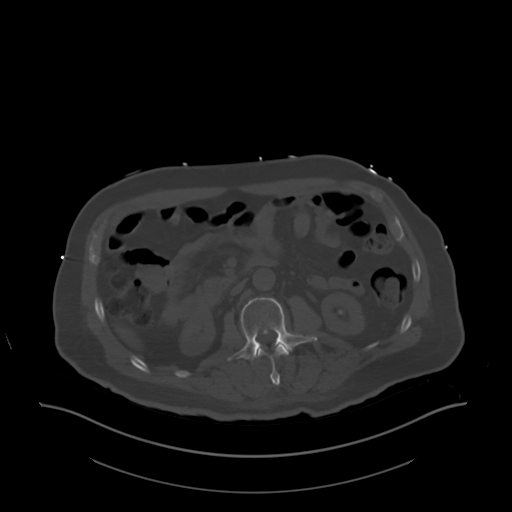
[im 84/109  soft-tissue]
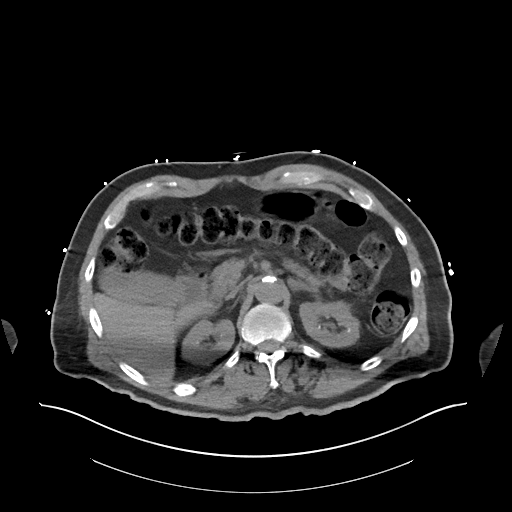
[im 92/109  soft-tissue]
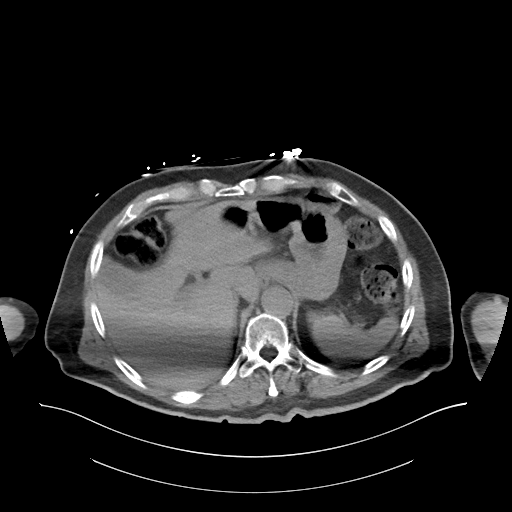
[im 100/109  soft-tissue]
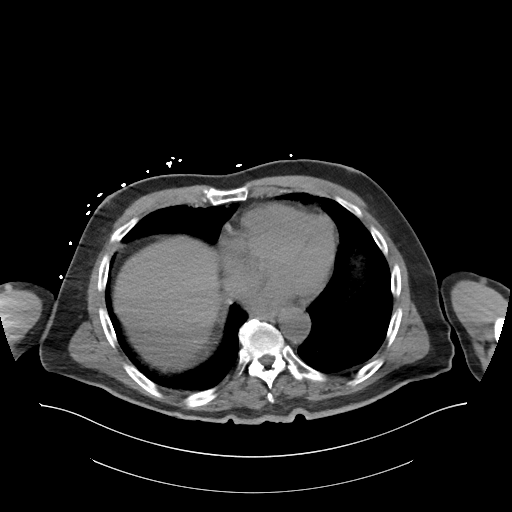

[Series 5: cor st · coronal · 0.82mm/px · 3 of 101 slices shown]
[im 34/101  soft-tissue]
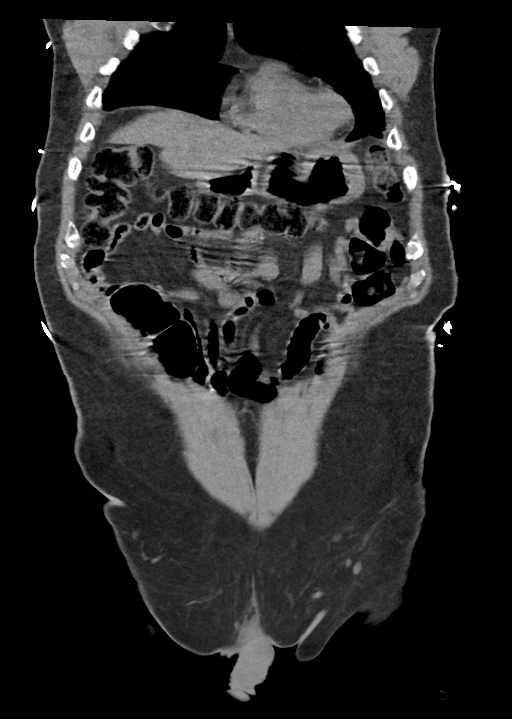
[im 45/101  soft-tissue]
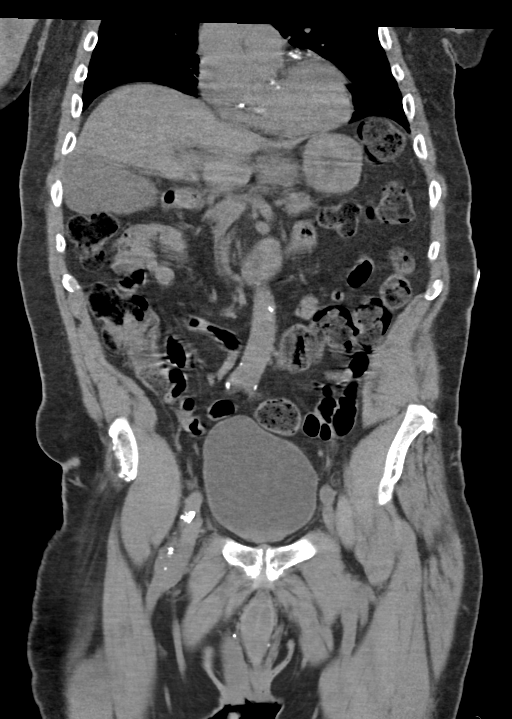
[im 56/101  soft-tissue]
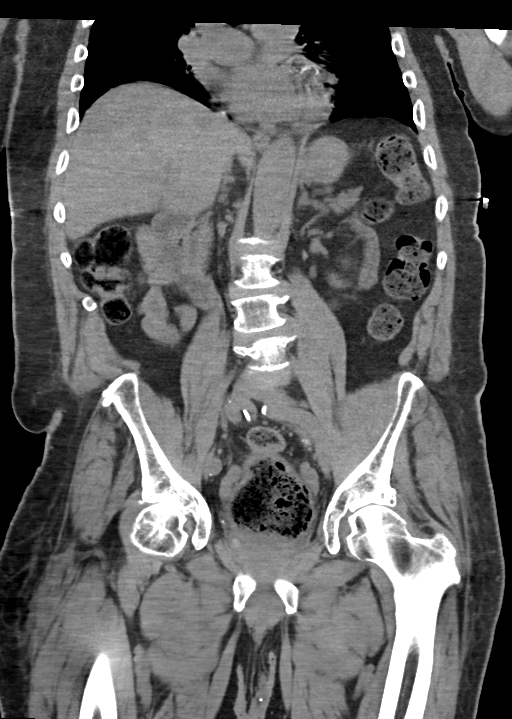

[15 of 46 positions shown; findings below may reference images not displayed]

FINDINGS: Lower chest: No acute abnormality.

Hepatobiliary: Normal liver. Gallbladder is distended, but there is
no gallbladder wall thickening or adjacent inflammation. No
visualized stones. No bile duct dilation.

Pancreas: Unremarkable. No pancreatic ductal dilatation or
surrounding inflammatory changes.

Spleen: Normal in size without focal abnormality.

Adrenals/Urinary Tract: No adrenal masses.

Bilateral renal cortical thinning overall mildly reduced renal
sizes. Subtle, nonspecific low-attenuation mass, midpole the left
kidney, 11 mm, most likely a cyst. No other masses. Bilateral
nonobstructing intrarenal stones. No hydronephrosis. Ureters are
normal in course and in caliber. Bladder is unremarkable.

Stomach/Bowel: Rectum is moderate to markedly distended with stool.
No wall thickening or adjacent inflammation.

Colon is normal in caliber. Mild colonic stool burden. No colonic
wall thickening or inflammation. Stomach and small bowel are
unremarkable. Appendix not visualized. No evidence of appendicitis.

Vascular/Lymphatic: Aortic atherosclerotic calcifications. No
aneurysm. No enlarged lymph nodes.

Reproductive: Unremarkable.

Other: No abdominal wall hernia or abnormality. No abdominopelvic
ascites.

Musculoskeletal: No fracture or acute finding. No osteoblastic or
osteolytic lesions.
IMPRESSION: 1. No acute findings within the abdomen or pelvis. No findings to
account for the patient's fever.
2. Distended gallbladder, without CT evidence of acute
cholecystitis.
3. Bilateral intrarenal stones.  No hydronephrosis.
4. Rectum significantly distended with stool. Mild increase in
colonic stool burden. No bowel obstruction or inflammation.
5. Aortic atherosclerosis.

## 2020-04-30 DIAGNOSIS — F0150 Vascular dementia without behavioral disturbance: Secondary | ICD-10-CM | POA: Diagnosis not present

## 2020-04-30 DIAGNOSIS — E1122 Type 2 diabetes mellitus with diabetic chronic kidney disease: Secondary | ICD-10-CM | POA: Diagnosis not present

## 2020-04-30 DIAGNOSIS — F0280 Dementia in other diseases classified elsewhere without behavioral disturbance: Secondary | ICD-10-CM | POA: Diagnosis not present

## 2020-04-30 DIAGNOSIS — I82411 Acute embolism and thrombosis of right femoral vein: Secondary | ICD-10-CM | POA: Diagnosis not present

## 2020-04-30 DIAGNOSIS — N390 Urinary tract infection, site not specified: Secondary | ICD-10-CM | POA: Diagnosis not present

## 2020-04-30 DIAGNOSIS — G309 Alzheimer's disease, unspecified: Secondary | ICD-10-CM | POA: Diagnosis not present

## 2020-04-30 DIAGNOSIS — N1832 Chronic kidney disease, stage 3b: Secondary | ICD-10-CM | POA: Diagnosis not present

## 2020-04-30 DIAGNOSIS — E1159 Type 2 diabetes mellitus with other circulatory complications: Secondary | ICD-10-CM | POA: Diagnosis not present

## 2020-04-30 DIAGNOSIS — I152 Hypertension secondary to endocrine disorders: Secondary | ICD-10-CM | POA: Diagnosis not present

## 2020-05-01 ENCOUNTER — Emergency Department (HOSPITAL_COMMUNITY): Payer: Medicare PPO

## 2020-05-01 ENCOUNTER — Inpatient Hospital Stay (HOSPITAL_COMMUNITY)
Admission: EM | Admit: 2020-05-01 | Discharge: 2020-05-07 | DRG: 871 | Disposition: A | Discharge status: Skilled Nursing Facility | Payer: Medicare PPO | Attending: Internal Medicine | Admitting: Internal Medicine

## 2020-05-01 ENCOUNTER — Other Ambulatory Visit: Payer: Self-pay

## 2020-05-01 DIAGNOSIS — N184 Chronic kidney disease, stage 4 (severe): Secondary | ICD-10-CM | POA: Diagnosis present

## 2020-05-01 DIAGNOSIS — R32 Unspecified urinary incontinence: Secondary | ICD-10-CM | POA: Diagnosis present

## 2020-05-01 DIAGNOSIS — G309 Alzheimer's disease, unspecified: Secondary | ICD-10-CM | POA: Diagnosis present

## 2020-05-01 DIAGNOSIS — Z20822 Contact with and (suspected) exposure to covid-19: Secondary | ICD-10-CM | POA: Diagnosis present

## 2020-05-01 DIAGNOSIS — F0150 Vascular dementia without behavioral disturbance: Secondary | ICD-10-CM | POA: Diagnosis present

## 2020-05-01 DIAGNOSIS — R000 Tachycardia, unspecified: Secondary | ICD-10-CM | POA: Diagnosis not present

## 2020-05-01 DIAGNOSIS — M5440 Lumbago with sciatica, unspecified side: Secondary | ICD-10-CM | POA: Diagnosis present

## 2020-05-01 DIAGNOSIS — Z9079 Acquired absence of other genital organ(s): Secondary | ICD-10-CM

## 2020-05-01 DIAGNOSIS — R410 Disorientation, unspecified: Secondary | ICD-10-CM | POA: Diagnosis not present

## 2020-05-01 DIAGNOSIS — Z8551 Personal history of malignant neoplasm of bladder: Secondary | ICD-10-CM

## 2020-05-01 DIAGNOSIS — J189 Pneumonia, unspecified organism: Secondary | ICD-10-CM

## 2020-05-01 DIAGNOSIS — B961 Klebsiella pneumoniae [K. pneumoniae] as the cause of diseases classified elsewhere: Secondary | ICD-10-CM | POA: Diagnosis present

## 2020-05-01 DIAGNOSIS — N400 Enlarged prostate without lower urinary tract symptoms: Secondary | ICD-10-CM | POA: Diagnosis present

## 2020-05-01 DIAGNOSIS — R9431 Abnormal electrocardiogram [ECG] [EKG]: Secondary | ICD-10-CM | POA: Diagnosis present

## 2020-05-01 DIAGNOSIS — Z794 Long term (current) use of insulin: Secondary | ICD-10-CM

## 2020-05-01 DIAGNOSIS — Z515 Encounter for palliative care: Secondary | ICD-10-CM | POA: Diagnosis not present

## 2020-05-01 DIAGNOSIS — L8990 Pressure ulcer of unspecified site, unspecified stage: Secondary | ICD-10-CM | POA: Diagnosis present

## 2020-05-01 DIAGNOSIS — R6520 Severe sepsis without septic shock: Secondary | ICD-10-CM

## 2020-05-01 DIAGNOSIS — B964 Proteus (mirabilis) (morganii) as the cause of diseases classified elsewhere: Secondary | ICD-10-CM | POA: Diagnosis present

## 2020-05-01 DIAGNOSIS — F0280 Dementia in other diseases classified elsewhere without behavioral disturbance: Secondary | ICD-10-CM | POA: Diagnosis present

## 2020-05-01 DIAGNOSIS — J90 Pleural effusion, not elsewhere classified: Secondary | ICD-10-CM | POA: Diagnosis not present

## 2020-05-01 DIAGNOSIS — E1169 Type 2 diabetes mellitus with other specified complication: Secondary | ICD-10-CM | POA: Diagnosis present

## 2020-05-01 DIAGNOSIS — E119 Type 2 diabetes mellitus without complications: Secondary | ICD-10-CM

## 2020-05-01 DIAGNOSIS — R569 Unspecified convulsions: Secondary | ICD-10-CM | POA: Diagnosis not present

## 2020-05-01 DIAGNOSIS — Z86718 Personal history of other venous thrombosis and embolism: Secondary | ICD-10-CM | POA: Diagnosis not present

## 2020-05-01 DIAGNOSIS — E11649 Type 2 diabetes mellitus with hypoglycemia without coma: Secondary | ICD-10-CM | POA: Diagnosis not present

## 2020-05-01 DIAGNOSIS — G4733 Obstructive sleep apnea (adult) (pediatric): Secondary | ICD-10-CM | POA: Diagnosis present

## 2020-05-01 DIAGNOSIS — N179 Acute kidney failure, unspecified: Secondary | ICD-10-CM | POA: Diagnosis present

## 2020-05-01 DIAGNOSIS — Z833 Family history of diabetes mellitus: Secondary | ICD-10-CM

## 2020-05-01 DIAGNOSIS — E860 Dehydration: Secondary | ICD-10-CM | POA: Diagnosis present

## 2020-05-01 DIAGNOSIS — N390 Urinary tract infection, site not specified: Secondary | ICD-10-CM | POA: Diagnosis present

## 2020-05-01 DIAGNOSIS — A419 Sepsis, unspecified organism: Secondary | ICD-10-CM

## 2020-05-01 DIAGNOSIS — Z66 Do not resuscitate: Secondary | ICD-10-CM | POA: Diagnosis present

## 2020-05-01 DIAGNOSIS — I129 Hypertensive chronic kidney disease with stage 1 through stage 4 chronic kidney disease, or unspecified chronic kidney disease: Secondary | ICD-10-CM | POA: Diagnosis present

## 2020-05-01 DIAGNOSIS — Z7901 Long term (current) use of anticoagulants: Secondary | ICD-10-CM

## 2020-05-01 DIAGNOSIS — R4182 Altered mental status, unspecified: Secondary | ICD-10-CM | POA: Diagnosis not present

## 2020-05-01 DIAGNOSIS — E785 Hyperlipidemia, unspecified: Secondary | ICD-10-CM | POA: Diagnosis present

## 2020-05-01 DIAGNOSIS — E1122 Type 2 diabetes mellitus with diabetic chronic kidney disease: Secondary | ICD-10-CM | POA: Diagnosis present

## 2020-05-01 DIAGNOSIS — R159 Full incontinence of feces: Secondary | ICD-10-CM | POA: Diagnosis present

## 2020-05-01 DIAGNOSIS — Z7982 Long term (current) use of aspirin: Secondary | ICD-10-CM

## 2020-05-01 DIAGNOSIS — Z820 Family history of epilepsy and other diseases of the nervous system: Secondary | ICD-10-CM

## 2020-05-01 DIAGNOSIS — M2550 Pain in unspecified joint: Secondary | ICD-10-CM | POA: Diagnosis not present

## 2020-05-01 DIAGNOSIS — A4159 Other Gram-negative sepsis: Secondary | ICD-10-CM | POA: Diagnosis present

## 2020-05-01 DIAGNOSIS — N1832 Chronic kidney disease, stage 3b: Secondary | ICD-10-CM | POA: Diagnosis not present

## 2020-05-01 DIAGNOSIS — Z87440 Personal history of urinary (tract) infections: Secondary | ICD-10-CM

## 2020-05-01 DIAGNOSIS — Z79899 Other long term (current) drug therapy: Secondary | ICD-10-CM

## 2020-05-01 DIAGNOSIS — K219 Gastro-esophageal reflux disease without esophagitis: Secondary | ICD-10-CM | POA: Diagnosis present

## 2020-05-01 DIAGNOSIS — N401 Enlarged prostate with lower urinary tract symptoms: Secondary | ICD-10-CM | POA: Diagnosis not present

## 2020-05-01 DIAGNOSIS — E1121 Type 2 diabetes mellitus with diabetic nephropathy: Secondary | ICD-10-CM | POA: Diagnosis not present

## 2020-05-01 DIAGNOSIS — F0390 Unspecified dementia without behavioral disturbance: Secondary | ICD-10-CM | POA: Diagnosis not present

## 2020-05-01 DIAGNOSIS — M1990 Unspecified osteoarthritis, unspecified site: Secondary | ICD-10-CM | POA: Diagnosis present

## 2020-05-01 DIAGNOSIS — M6281 Muscle weakness (generalized): Secondary | ICD-10-CM | POA: Diagnosis not present

## 2020-05-01 DIAGNOSIS — R5381 Other malaise: Secondary | ICD-10-CM | POA: Diagnosis not present

## 2020-05-01 DIAGNOSIS — Z7401 Bed confinement status: Secondary | ICD-10-CM | POA: Diagnosis not present

## 2020-05-01 DIAGNOSIS — G92 Toxic encephalopathy: Secondary | ICD-10-CM | POA: Diagnosis present

## 2020-05-01 DIAGNOSIS — E872 Acidosis: Secondary | ICD-10-CM | POA: Diagnosis present

## 2020-05-01 DIAGNOSIS — Z7189 Other specified counseling: Secondary | ICD-10-CM | POA: Diagnosis not present

## 2020-05-01 LAB — URINALYSIS, ROUTINE W REFLEX MICROSCOPIC
Bilirubin Urine: NEGATIVE
Glucose, UA: NEGATIVE mg/dL
Ketones, ur: NEGATIVE mg/dL
Nitrite: NEGATIVE
Protein, ur: 30 mg/dL — AB
Specific Gravity, Urine: 1.012 (ref 1.005–1.030)
WBC, UA: >50 WBC/hpf — ABNORMAL HIGH (ref 0–5)
pH: 7 (ref 5.0–8.0)

## 2020-05-01 LAB — COMPREHENSIVE METABOLIC PANEL
ALT: 13 U/L (ref 0–44)
AST: 22 U/L (ref 15–41)
Albumin: 3.6 g/dL (ref 3.5–5.0)
Alkaline Phosphatase: 66 U/L (ref 38–126)
Anion gap: 14 (ref 5–15)
BUN: 44 mg/dL — ABNORMAL HIGH (ref 8–23)
CO2: 24 mmol/L (ref 22–32)
Calcium: 9.3 mg/dL (ref 8.9–10.3)
Chloride: 106 mmol/L (ref 98–111)
Creatinine, Ser: 3.09 mg/dL — ABNORMAL HIGH (ref 0.61–1.24)
GFR calc Af Amer: 21 mL/min — ABNORMAL LOW (ref >60)
GFR calc non Af Amer: 18 mL/min — ABNORMAL LOW (ref >60)
Glucose, Bld: 150 mg/dL — ABNORMAL HIGH (ref 70–99)
Potassium: 4.4 mmol/L (ref 3.5–5.1)
Sodium: 144 mmol/L (ref 135–145)
Total Bilirubin: 1.1 mg/dL (ref 0.3–1.2)
Total Protein: 7.5 g/dL (ref 6.5–8.1)

## 2020-05-01 LAB — CBC WITH DIFFERENTIAL/PLATELET
Abs Immature Granulocytes: 0.03 10*3/uL (ref 0.00–0.07)
Basophils Absolute: 0 10*3/uL (ref 0.0–0.1)
Basophils Relative: 0 %
Eosinophils Absolute: 0 10*3/uL (ref 0.0–0.5)
Eosinophils Relative: 0 %
HCT: 45.8 % (ref 39.0–52.0)
Hemoglobin: 15 g/dL (ref 13.0–17.0)
Immature Granulocytes: 0 %
Lymphocytes Relative: 5 %
Lymphs Abs: 0.5 10*3/uL — ABNORMAL LOW (ref 0.7–4.0)
MCH: 29.9 pg (ref 26.0–34.0)
MCHC: 32.8 g/dL (ref 30.0–36.0)
MCV: 91.2 fL (ref 80.0–100.0)
Monocytes Absolute: 0.1 10*3/uL (ref 0.1–1.0)
Monocytes Relative: 1 %
Neutro Abs: 9.3 10*3/uL — ABNORMAL HIGH (ref 1.7–7.7)
Neutrophils Relative %: 94 %
Platelets: 177 10*3/uL (ref 150–400)
RBC: 5.02 MIL/uL (ref 4.22–5.81)
RDW: 14 % (ref 11.5–15.5)
WBC: 9.9 10*3/uL (ref 4.0–10.5)
nRBC: 0 % (ref 0.0–0.2)

## 2020-05-01 LAB — SARS CORONAVIRUS 2 BY RT PCR (HOSPITAL ORDER, PERFORMED IN ~~LOC~~ HOSPITAL LAB): SARS Coronavirus 2: NEGATIVE

## 2020-05-01 LAB — PROTIME-INR
INR: 1.1 (ref 0.8–1.2)
Prothrombin Time: 13.8 seconds (ref 11.4–15.2)

## 2020-05-01 LAB — LACTIC ACID, PLASMA: Lactic Acid, Venous: 6.4 mmol/L (ref 0.5–1.9)

## 2020-05-01 LAB — APTT: aPTT: 33 seconds (ref 24–36)

## 2020-05-01 MED ORDER — ACETAMINOPHEN 650 MG RE SUPP
650.0000 mg | Freq: Once | RECTAL | Status: AC
  Administered 2020-05-01: 650 mg via RECTAL
  Filled 2020-05-01: qty 1

## 2020-05-01 MED ORDER — LACTATED RINGERS IV BOLUS (SEPSIS)
1000.0000 mL | Freq: Once | INTRAVENOUS | Status: AC
  Administered 2020-05-01: 1000 mL via INTRAVENOUS

## 2020-05-01 MED ORDER — SODIUM CHLORIDE 0.9 % IV SOLN
1.0000 g | INTRAVENOUS | Status: DC
  Administered 2020-05-01: 1 g via INTRAVENOUS
  Filled 2020-05-01 (×2): qty 10

## 2020-05-01 NOTE — ED Provider Notes (Addendum)
[facility] DEPT Provider Note   CSN: [id number] Arrival date & time: 05/01/20  2031     History Chief Complaint  Patient presents with  . Chills    Tony Fox is a 79 y.o. male.  Patient is an 79 year old male who lives at home with his wife with a history of vascular dementia, DVT, diabetes, prior sepsis and metabolic encephalopathy who is presenting today with EMS.  EMS reports they were called out to the house because family was concerned he was having seizures.  They witnessed patient having chills which the family reports he has had several times in the last 24 hours.  He does have a prior history of UTI in EMS reported foul-smelling urine at the scene when patient was incontinent.  Patient is not able to give much of a history but will answer yes and no questions.  He did say yes when asked if he had a cough and when he did not feel well.  He denied chest pain, abdominal pain or diarrhea.  However unclear how accurate patient is is a historian.  He did receive the Covid vaccine in February.  The history is provided by the patient and the EMS personnel. The history is limited by the absence of a caregiver.       Past Medical History:  Diagnosis Date  . Arthritis   . Borderline diabetes mellitus   . BPH (benign prostatic hypertrophy)   . DDD (degenerative disc disease), lumbar   . GERD (gastroesophageal reflux disease)   . History of bladder cancer   . Hyperlipidemia   . Hypertension   . Nocturia   . OSA on CPAP    PER STUDY  05/2007  MODERATE OSA  . Wears glasses     Patient Active Problem List   Diagnosis Date Noted  . Sepsis ([place]) 03/05/2020  . Acute metabolic encephalopathy 33/35/4562  . Elevated LFTs 03/05/2020  . Type 2 diabetes mellitus ([place]) 03/05/2020  . Acute deep vein thrombosis (DVT) of right femoral vein ([place]) 12/04/2019  . OSA (obstructive sleep apnea) 12/04/2019  . Dyslipidemia associated with type 2 diabetes  mellitus ([place]) 12/04/2019  . Vitamin D deficiency 12/04/2019  . Vascular dementia without behavioral disturbance ([place]) 12/04/2019  . Physical deconditioning 12/04/2019  . Elevated troponin level not due to acute coronary syndrome 11/24/2019  . AKI (acute kidney injury) ([place]) 11/24/2019  . Elevated d-dimer 11/24/2019  . Lactic acidosis 11/24/2019  . Generalized weakness 11/24/2019  . Thrombocytopenia ([place]) 11/24/2019  . Memory loss 07/16/2015  . History of bladder cancer 07/16/2015  . Hypertension associated with type 2 diabetes mellitus ([place]) 07/16/2015  . Hyperlipidemia 07/16/2015  . Uncomplicated type 2 diabetes mellitus ([place]) 07/16/2015    Past Surgical History:  Procedure Laterality Date  . CYSTOSCOPY W/ RETROGRADES Bilateral 12/30/2014   Procedure: CYSTOSCOPY WITH RETROGRADE PYELOGRAM;  Surgeon: Arvil Persons, MD;  Location: [facility];  Service: Urology;  Laterality: Bilateral;  . CYSTOSCOPY WITH BIOPSY N/A 03/07/2014   Procedure: CYSTOSCOPY WITH Bladder Washings and Bladder BIOPSY;  Surgeon: Lowella Bandy, MD;  Location: [facility];  Service: Urology;  Laterality: N/A;  . CYSTOSCOPY WITH BIOPSY N/A 12/30/2014   Procedure: CYSTOSCOPY WITH BIOPSY;  Surgeon: Arvil Persons, MD;  Location: [facility];  Service: Urology;  Laterality: N/A;  . TRANSURETHRAL RESECTION OF BLADDER TUMOR  12-30-2008//   11-18-2008//   10-08-2008  . TRANSURETHRAL RESECTION OF PROSTATE N/A 12/30/2014  Procedure: TRANSURETHRAL RESECTION PROSTATE BIOPSY;  Surgeon: Arvil Persons, MD;  Location: [facility];  Service: Urology;  Laterality: N/A;       Family History  Problem Relation Age of Onset  . Alzheimer's disease Mother   . Stroke Father     Social History   Tobacco Use  . Smoking status: Never Smoker  . Smokeless tobacco: Never Used  Vaping Use  . Vaping Use: Never used  Substance Use Topics  . Alcohol use: No    Alcohol/week: 0.0 standard  drinks  . Drug use: No    Home Medications Prior to Admission medications   Medication Sig Start Date End Date Taking? Authorizing Provider  acetaminophen (TYLENOL) 500 MG tablet Take 1,000 mg by mouth every 6 (six) hours as needed (for pain).    [provider]  apixaban (ELIQUIS) 5 MG TABS tablet Take 1 tablet (5 mg total) by mouth 2 (two) times daily. 03/12/20   Little Ishikawa, MD  ascorbic acid (VITAMIN C) 500 MG tablet Take 500 mg by mouth 2 (two) times a week.     [provider]  aspirin EC 81 MG tablet Take 81 mg by mouth daily.    [provider]  Cholecalciferol (VITAMIN D-3 PO) Take 1 capsule by mouth See admin instructions. Take 1 capsule by mouth 2 times a week- on Mondays and Fridays    [provider]  donepezil (ARICEPT) 10 MG tablet Take 10 mg by mouth in the morning.    [provider]  furosemide (LASIX) 20 MG tablet Take 20 mg by mouth in the morning.    [provider]  glipizide-metformin (METAGLIP) 2.5-250 MG tablet Take 1 tablet by mouth daily.    [provider]  liver oil-zinc oxide (DESITIN) 40 % ointment Apply 1 application topically as needed for irritation (to affected sites).    [provider]  NON FORMULARY Diet - Liquids: _X__Regular; ___Thickened ___ Consistency: ___ Nectar, ___Honey, ___ Pudding; ____ Fluid Restriction General Diet: NAS CON CHO    [provider]    Allergies    Patient has no known allergies.  Review of Systems   Review of Systems  Unable to perform ROS: Dementia    Physical Exam Updated Vital Signs BP 118/68 (BP Location: Right Arm)   Pulse (!) 101   Temp 99.6 F (37.6 C) (Oral)   Resp 18   SpO2 96%   Physical Exam Vitals and nursing note reviewed.  Constitutional:      General: He is not in acute distress.    Appearance: Normal appearance. He is well-developed and normal weight.  HENT:     Head: Normocephalic and atraumatic.      Mouth/Throat:     Mouth: Mucous membranes are dry.  Eyes:     Conjunctiva/sclera: Conjunctivae normal.     Pupils: Pupils are equal, round, and reactive to light.  Cardiovascular:     Rate and Rhythm: Regular rhythm. Tachycardia present.     Pulses: Normal pulses.     Heart sounds: No murmur heard.   Pulmonary:     Effort: Pulmonary effort is normal. No respiratory distress.     Breath sounds: Examination of the left-lower field reveals rhonchi. Rhonchi present. No wheezing or rales.  Abdominal:     General: There is no distension.     Palpations: Abdomen is soft.     Tenderness: There is no abdominal tenderness. There is no guarding or rebound.  Musculoskeletal:        General: No tenderness. Normal range of motion.     Cervical back: Normal range of motion and neck supple.     Right lower leg: No edema.     Left lower leg: No edema.  Skin:    General: Skin is warm and dry.     Findings: No erythema or rash.  Neurological:     Mental Status: He is alert.     Comments: Patient will follow some commands but will not speak more than yes or no  Psychiatric:     Comments: Calm and cooperative     ED Results / Procedures / Treatments   Labs (all labs ordered are listed, but only abnormal results are displayed) Labs Reviewed  LACTIC ACID, PLASMA - Abnormal; Notable for the following components:      Result Value   Lactic Acid, Venous 6.4 (*)    All other components within normal limits  COMPREHENSIVE METABOLIC PANEL - Abnormal; Notable for the following components:   Glucose, Bld 150 (*)    BUN 44 (*)    Creatinine, Ser 3.09 (*)    GFR calc non Af Amer 18 (*)    GFR calc Af Amer 21 (*)    All other components within normal limits  CBC WITH DIFFERENTIAL/PLATELET - Abnormal; Notable for the following components:   Neutro Abs 9.3 (*)    Lymphs Abs 0.5 (*)    All other components within normal limits  CULTURE, BLOOD (ROUTINE X 2)  CULTURE, BLOOD (ROUTINE X 2)  URINE  CULTURE  SARS CORONAVIRUS 2 BY RT PCR (HOSPITAL ORDER, [place] [facility])  APTT  PROTIME-INR  LACTIC ACID, PLASMA  URINALYSIS, ROUTINE W REFLEX MICROSCOPIC    EKG None  Radiology DG Chest Port 1 View  Result Date: 05/01/2020 CLINICAL DATA:  Seizure like activity EXAM: PORTABLE CHEST 1 VIEW COMPARISON:  03/05/2020 FINDINGS: The heart size and mediastinal contours are within normal limits. Both lungs are clear. Low lung volumes. The visualized skeletal structures are unremarkable. IMPRESSION: No active disease. Electronically Signed   By: Donavan Foil M.D.   On: 05/01/2020 22:03    Procedures Procedures (including critical care time)  Medications Ordered in ED Medications  lactated ringers bolus 1,000 mL (1,000 mLs Intravenous New Bag/Given 05/01/20 2139)  acetaminophen (TYLENOL) suppository 650 mg (650 mg Rectal Given 05/01/20 2148)    ED Course  I have reviewed the triage vital signs and the nursing notes.  Pertinent labs & imaging results that were available during my care of the patient were reviewed by me and considered in my medical decision making (see chart for details).    MDM Rules/Calculators/A&P                          Patient presenting today without much history except for chills at home and concern for sepsis here.  Patient is febrile, tachycardic however the source at this time is unclear.  Suspect respiratory versus urinary.  Patient has been vaccinated against Covid however code sepsis initiated.  Initial lactic acid is pending and patient started on 1 L of IV fluid.  10:41 PM Patient's blood pressure remained stable but temperature is 101.8 rectally.  Patient's lactic acid is 6.4 and fluid was increased to 30/kg which equals a total of 3 L.  Patient also found to have AKI today with creatinine of 3.09 from his baseline of 1.7,  CBC without acute findings and chest x-ray within normal limits.  Given patient's chest x-ray is greatest concern is  for UTI as patient was hospitalized in April for similar symptoms.  At that time he had blood cultures that grew out strep viridans 1of 4 which was thought to be related to contaminant but was felt to have a UTI however he did grow out multiple species.  Will cover with Rocephin at this time  CRITICAL CARE Performed by: Tulsi Crossett Total critical care time: 30 minutes Critical care time was exclusive of separately billable procedures and treating other patients. Critical care was necessary to treat or prevent imminent or life-threatening deterioration. Critical care was time spent personally by me on the following activities: development of treatment plan with patient and/or surrogate as well as nursing, discussions with consultants, evaluation of patient's response to treatment, examination of patient, obtaining history from patient or surrogate, ordering and performing treatments and interventions, ordering and review of laboratory studies, ordering and review of radiographic studies, pulse oximetry and re-evaluation of patient's condition.  Final Clinical Impression(s) / ED Diagnoses Final diagnoses:  None    Rx / DC Orders ED Discharge Orders    None       Blanchie Dessert, MD 05/01/20 2250    Blanchie Dessert, MD 07/17/20 1329

## 2020-05-01 NOTE — ED Triage Notes (Signed)
Pt lives at home with wife and experienced what the family thought was seizure like activity. EMS was called and stated that the activity was chills. Family reported that he has exhibited these sypmtoms on several occassions and pt has always been diagnosed with an UTI. EMS reported foul smelling urine at the scene when pt was incont.

## 2020-05-01 NOTE — ED Provider Notes (Signed)
  Provider Note MRN:  [id number]  Arrival date & time: 05/02/20    ED Course and Medical Decision Making  Assumed care from Dr. Maryan Rued at shift change.  Sepsis suspect urinary source awaiting UA will need admit.  Urinalysis is consistent with infection, admitted to hospital service for further care.  Procedures  Final Clinical Impressions(s) / ED Diagnoses     ICD-10-CM   1. Sepsis, due to unspecified organism, unspecified whether acute organ dysfunction present [facility])  A41.9     ED Discharge Orders    None      Discharge Instructions   None     Barth Kirks. [place] Small, [place] [email or web address]    Maudie Flakes, MD 05/02/20 Quentin Mulling

## 2020-05-02 ENCOUNTER — Other Ambulatory Visit: Payer: Self-pay

## 2020-05-02 DIAGNOSIS — N179 Acute kidney failure, unspecified: Secondary | ICD-10-CM

## 2020-05-02 DIAGNOSIS — N184 Chronic kidney disease, stage 4 (severe): Secondary | ICD-10-CM | POA: Diagnosis present

## 2020-05-02 DIAGNOSIS — G309 Alzheimer's disease, unspecified: Secondary | ICD-10-CM | POA: Diagnosis present

## 2020-05-02 DIAGNOSIS — A4159 Other Gram-negative sepsis: Secondary | ICD-10-CM | POA: Diagnosis present

## 2020-05-02 DIAGNOSIS — G92 Toxic encephalopathy: Secondary | ICD-10-CM | POA: Diagnosis present

## 2020-05-02 DIAGNOSIS — Z20822 Contact with and (suspected) exposure to covid-19: Secondary | ICD-10-CM | POA: Diagnosis present

## 2020-05-02 DIAGNOSIS — G4733 Obstructive sleep apnea (adult) (pediatric): Secondary | ICD-10-CM | POA: Diagnosis present

## 2020-05-02 DIAGNOSIS — E872 Acidosis: Secondary | ICD-10-CM | POA: Diagnosis present

## 2020-05-02 DIAGNOSIS — L8990 Pressure ulcer of unspecified site, unspecified stage: Secondary | ICD-10-CM | POA: Diagnosis present

## 2020-05-02 DIAGNOSIS — E860 Dehydration: Secondary | ICD-10-CM | POA: Diagnosis present

## 2020-05-02 DIAGNOSIS — I129 Hypertensive chronic kidney disease with stage 1 through stage 4 chronic kidney disease, or unspecified chronic kidney disease: Secondary | ICD-10-CM | POA: Diagnosis present

## 2020-05-02 DIAGNOSIS — A419 Sepsis, unspecified organism: Secondary | ICD-10-CM

## 2020-05-02 DIAGNOSIS — F0280 Dementia in other diseases classified elsewhere without behavioral disturbance: Secondary | ICD-10-CM | POA: Diagnosis present

## 2020-05-02 DIAGNOSIS — E11649 Type 2 diabetes mellitus with hypoglycemia without coma: Secondary | ICD-10-CM | POA: Diagnosis not present

## 2020-05-02 DIAGNOSIS — J189 Pneumonia, unspecified organism: Secondary | ICD-10-CM | POA: Diagnosis present

## 2020-05-02 DIAGNOSIS — E1169 Type 2 diabetes mellitus with other specified complication: Secondary | ICD-10-CM | POA: Diagnosis present

## 2020-05-02 DIAGNOSIS — R6520 Severe sepsis without septic shock: Secondary | ICD-10-CM | POA: Diagnosis present

## 2020-05-02 DIAGNOSIS — E1122 Type 2 diabetes mellitus with diabetic chronic kidney disease: Secondary | ICD-10-CM | POA: Diagnosis present

## 2020-05-02 DIAGNOSIS — Z66 Do not resuscitate: Secondary | ICD-10-CM | POA: Diagnosis present

## 2020-05-02 DIAGNOSIS — E1121 Type 2 diabetes mellitus with diabetic nephropathy: Secondary | ICD-10-CM | POA: Diagnosis not present

## 2020-05-02 DIAGNOSIS — N390 Urinary tract infection, site not specified: Secondary | ICD-10-CM | POA: Diagnosis present

## 2020-05-02 DIAGNOSIS — R9431 Abnormal electrocardiogram [ECG] [EKG]: Secondary | ICD-10-CM | POA: Diagnosis present

## 2020-05-02 DIAGNOSIS — M5440 Lumbago with sciatica, unspecified side: Secondary | ICD-10-CM | POA: Diagnosis present

## 2020-05-02 DIAGNOSIS — B964 Proteus (mirabilis) (morganii) as the cause of diseases classified elsewhere: Secondary | ICD-10-CM | POA: Diagnosis present

## 2020-05-02 DIAGNOSIS — B961 Klebsiella pneumoniae [K. pneumoniae] as the cause of diseases classified elsewhere: Secondary | ICD-10-CM | POA: Diagnosis present

## 2020-05-02 DIAGNOSIS — F0150 Vascular dementia without behavioral disturbance: Secondary | ICD-10-CM | POA: Diagnosis present

## 2020-05-02 DIAGNOSIS — N400 Enlarged prostate without lower urinary tract symptoms: Secondary | ICD-10-CM | POA: Diagnosis present

## 2020-05-02 DIAGNOSIS — Z515 Encounter for palliative care: Secondary | ICD-10-CM | POA: Diagnosis not present

## 2020-05-02 DIAGNOSIS — Z7189 Other specified counseling: Secondary | ICD-10-CM | POA: Diagnosis not present

## 2020-05-02 LAB — BLOOD CULTURE ID PANEL (REFLEXED)

## 2020-05-02 LAB — BASIC METABOLIC PANEL
Anion gap: 11 (ref 5–15)
BUN: 35 mg/dL — ABNORMAL HIGH (ref 8–23)
CO2: 18 mmol/L — ABNORMAL LOW (ref 22–32)
Calcium: 8.2 mg/dL — ABNORMAL LOW (ref 8.9–10.3)
Chloride: 109 mmol/L (ref 98–111)
Creatinine, Ser: 2.36 mg/dL — ABNORMAL HIGH (ref 0.61–1.24)
GFR calc Af Amer: 29 mL/min — ABNORMAL LOW (ref >60)
GFR calc non Af Amer: 25 mL/min — ABNORMAL LOW (ref >60)
Glucose, Bld: 106 mg/dL — ABNORMAL HIGH (ref 70–99)
Potassium: 4.2 mmol/L (ref 3.5–5.1)
Sodium: 138 mmol/L (ref 135–145)

## 2020-05-02 LAB — MAGNESIUM: Magnesium: 1.2 mg/dL — ABNORMAL LOW (ref 1.7–2.4)

## 2020-05-02 LAB — COMPREHENSIVE METABOLIC PANEL
ALT: 10 U/L (ref 0–44)
AST: 15 U/L (ref 15–41)
Albumin: 2.1 g/dL — ABNORMAL LOW (ref 3.5–5.0)
Alkaline Phosphatase: 41 U/L (ref 38–126)
Anion gap: 4 — ABNORMAL LOW (ref 5–15)
BUN: 29 mg/dL — ABNORMAL HIGH (ref 8–23)
CO2: 18 mmol/L — ABNORMAL LOW (ref 22–32)
Calcium: 6.2 mg/dL — CL (ref 8.9–10.3)
Chloride: 122 mmol/L — ABNORMAL HIGH (ref 98–111)
Creatinine, Ser: 1.69 mg/dL — ABNORMAL HIGH (ref 0.61–1.24)
GFR calc Af Amer: 43 mL/min — ABNORMAL LOW (ref >60)
GFR calc non Af Amer: 37 mL/min — ABNORMAL LOW (ref >60)
Glucose, Bld: 73 mg/dL (ref 70–99)
Potassium: 2.8 mmol/L — ABNORMAL LOW (ref 3.5–5.1)
Sodium: 144 mmol/L (ref 135–145)
Total Bilirubin: 1 mg/dL (ref 0.3–1.2)
Total Protein: 4.5 g/dL — ABNORMAL LOW (ref 6.5–8.1)

## 2020-05-02 LAB — LACTIC ACID, PLASMA
Lactic Acid, Venous: 4.8 mmol/L (ref 0.5–1.9)
Lactic Acid, Venous: 1.6 mmol/L (ref 0.5–1.9)
Lactic Acid, Venous: 1.2 mmol/L (ref 0.5–1.9)

## 2020-05-02 LAB — CBG MONITORING, ED
Glucose-Capillary: 106 mg/dL — ABNORMAL HIGH (ref 70–99)
Glucose-Capillary: 81 mg/dL (ref 70–99)
Glucose-Capillary: 83 mg/dL (ref 70–99)

## 2020-05-02 LAB — GLUCOSE, CAPILLARY
Glucose-Capillary: 114 mg/dL — ABNORMAL HIGH (ref 70–99)
Glucose-Capillary: 169 mg/dL — ABNORMAL HIGH (ref 70–99)

## 2020-05-02 MED ORDER — INSULIN ASPART 100 UNIT/ML ~~LOC~~ SOLN
0.0000 [IU] | Freq: Three times a day (TID) | SUBCUTANEOUS | Status: DC
  Administered 2020-05-03: 1 [IU] via SUBCUTANEOUS
  Administered 2020-05-04: 2 [IU] via SUBCUTANEOUS
  Administered 2020-05-05: 1 [IU] via SUBCUTANEOUS
  Filled 2020-05-02: qty 0.09

## 2020-05-02 MED ORDER — CALCIUM GLUCONATE-NACL 2-0.675 GM/100ML-% IV SOLN
2.0000 g | Freq: Once | INTRAVENOUS | Status: AC
  Administered 2020-05-02: 2000 mg via INTRAVENOUS
  Filled 2020-05-02: qty 100

## 2020-05-02 MED ORDER — POTASSIUM CHLORIDE 10 MEQ/100ML IV SOLN
10.0000 meq | INTRAVENOUS | Status: AC
  Administered 2020-05-02 (×4): 10 meq via INTRAVENOUS
  Filled 2020-05-02 (×3): qty 100

## 2020-05-02 MED ORDER — SODIUM CHLORIDE 0.9 % IV SOLN: INTRAVENOUS | Status: DC

## 2020-05-02 MED ORDER — DONEPEZIL HCL 10 MG PO TABS
10.0000 mg | ORAL_TABLET | Freq: Every morning | ORAL | Status: DC
  Administered 2020-05-02 – 2020-05-07 (×6): 10 mg via ORAL
  Filled 2020-05-02 (×5): qty 1
  Filled 2020-05-02: qty 2

## 2020-05-02 MED ORDER — ACETAMINOPHEN 325 MG PO TABS
650.0000 mg | ORAL_TABLET | Freq: Four times a day (QID) | ORAL | Status: DC | PRN
  Administered 2020-05-03 – 2020-05-06 (×2): 650 mg via ORAL
  Filled 2020-05-02 (×2): qty 2

## 2020-05-02 MED ORDER — LACTATED RINGERS IV BOLUS
1000.0000 mL | Freq: Once | INTRAVENOUS | Status: AC
  Administered 2020-05-02: 1000 mL via INTRAVENOUS

## 2020-05-02 MED ORDER — SODIUM CHLORIDE 0.9 % IV SOLN
2.0000 g | INTRAVENOUS | Status: DC
  Administered 2020-05-02 – 2020-05-04 (×3): 2 g via INTRAVENOUS
  Filled 2020-05-02: qty 2
  Filled 2020-05-02: qty 20
  Filled 2020-05-02: qty 2

## 2020-05-02 MED ORDER — ACETAMINOPHEN 650 MG RE SUPP: 650.0000 mg | Freq: Four times a day (QID) | RECTAL | Status: DC | PRN

## 2020-05-02 MED ORDER — INSULIN ASPART 100 UNIT/ML ~~LOC~~ SOLN
0.0000 [IU] | Freq: Every day | SUBCUTANEOUS | Status: DC
  Filled 2020-05-02: qty 0.05

## 2020-05-02 MED ORDER — APIXABAN 5 MG PO TABS
5.0000 mg | ORAL_TABLET | Freq: Two times a day (BID) | ORAL | Status: DC
  Administered 2020-05-02 – 2020-05-07 (×11): 5 mg via ORAL
  Filled 2020-05-02 (×11): qty 1

## 2020-05-02 NOTE — Progress Notes (Signed)
Entered room to place CPAP device.  Patient does not appear capable of removing mask in an emergent situation which is contraindicated for CPAP.

## 2020-05-02 NOTE — Progress Notes (Addendum)
PROGRESS NOTE    Tony Fox  PIR:[id number] DOB: [birth year] DOA: 05/01/2020 PCP: Willey Blade, MD  Brief Narrative:  79 year old African-American male retired [employer] OSA CPAP Alzheimer's dementia stage IV-V-saw Dr. Ellouise Newer neurologist 07/16/2015 MoCA score 27/30 at that time BPH with bladder cancer in addition-TUR bladder 2009 high-grade TCC Insulin-dependent DM TY 2 RLE DVT 12/04/2019  Last admission 05/13/1600-->0/93/2355 toxic metabolic encephalopathy superimposed on dementia secondary to strep viridans UTI-patient was treated with antibiotics-ID was curb sided and did not feel that an echocardiogram was needed and patient was discharged to home with home health given his significant dementia.  Presented to [facility] ED 6/18?  Seizures per family report Febrile, tachycardic-septic-T-max 101.8 lactic acid 6.4 AKI creatinine 3.09 (baseline 1.7)   Assessment & Plan:   Principal Problem:   UTI (urinary tract infection) Active Problems:   OSA (obstructive sleep apnea)   Type 2 diabetes mellitus (HCC)   Severe sepsis (HCC)   Acute renal failure superimposed on chronic kidney disease ([place])   1. Severe sepsis likely secondary to UTI-cause still undetermined at this time a. Empiric ceftriaxone 1 g every 24 b. Saline 125 cc/h-RN staff in the ED aware c. Follow blood urine culture d. Would repeat 2 view chest x-ray 6/20 given moderate to severe dementia as this is an independent risk factor for aspiration and  e. note that the patient is on nectar thick liquids at home but regular diet based on MAR f. May require speech therapy input if overt concerns from nursing about aspiration 2. AKI secondary to above 3. mild metabolic acidosis probably from sepsis but also on Metformin therefore component of type B metabolic acidosis in addition a. He was at home on a saline Gtt apparently per PCP b. Labs a.m. holding PTA Lasix c. 20 daily in addition to hypoglycemic agents 4. Prior  bladder tumor/BPH status post surgery 2009 a. Not on Flomax?-Consider the same if unable to pass urine 5. Prior DVT 11/2019 a. Resume apixaban 5 twice daily b. No aspirin at this time and have held it on MAR 6. NIDDM a. At home seems to be on Metaglip b. Component of this can cause metabolic acidosis and would discontinue completely Metformin from his regimen given his age and risk for side effects c. If blood sugars are above 180 nursing staff to let me know so that I could implement sliding scale but would not at this stage and with his age aggressively control 7. Toxic metabolic encephalopathy/moderate to severe dementia stage III-IV a. Resume Aricept 10 every morning b. Expect this will improve with treatment of his infection 8. HTN a. History of 9. OSA a. Attempt CPAP at night as per orders 10. QTC prolongation a. Keep on telemetry today and monitor trends as needed    DVT prophylaxis: Apixaban 5 twice daily Code Status: DNR Family Communication:  spoke with son 6/19 --had a UTI late jan and started declining--went from walking and moving around and couldn't get up-they have CNA's helping out at home--this may be from his sciatica and lbp--he is gotten up x 2 per week by sons--most of the time if is a challenge to get him up They are attempting to get a lift bed CNA's come out every am and to ensure that he gets ckleaned up 3-4 days a week they have an RN Disposition:  Status is: Inpatient  Remains inpatient appropriate because:Hemodynamically unstable, Persistent severe electrolyte disturbances, Ongoing active pain requiring inpatient pain management and IV treatments appropriate due  to intensity of illness or inability to take PO   Dispo: The patient is from: Home              Anticipated d/c is to: SNF              Anticipated d/c date is: 3 days              Patient currently is not medically stable to d/c. Consultants:   Palliative care  Procedures:  none  Antimicrobials: Ceftriaxone  Subjective: Has a headache Seems clear to discussion--has some memory defiict but can tell me that he is in [place] and [place]--seems to lose track of his train of thought Otherwise well and re-oreints easily  Objective: Vitals:   05/02/20 0130 05/02/20 0146 05/02/20 0340 05/02/20 0632  BP: (!) 111/91  119/82 (!) 131/93  Pulse: 83  70 72  Resp: (!) 22  (!) 21 (!) 21  Temp:  97.8 F (36.6 C) 98.4 F (36.9 C) 98.1 F (36.7 C)  TempSrc:  Oral Oral Oral  SpO2: 99%  99% 100%   No intake or output data in the 24 hours ending 05/02/20 0729 There were no vitals filed for this visit.  Examination:  General exam: awake alert  arcus senilis + answering q's but loses track of train of thought Respiratory system: clea rno added sound no rales rhonchi Cardiovascular system: s1 s 2no m/r/g Gastrointestinal system: sof tnt nd no reboudn no gaurd. Central nervous system: intact moving limbs x 4 without deficit-power Extremities: ROM intact but limited on the right side and lower extremity straight leg raise Skin: No edema Psychiatry: Confused to some degree  Data Reviewed: I have personally reviewed following labs and imaging studies  BUN/creatinine 44/3.09-->35/2.3 Lactic acid 6.4-->4.8 Calcium 8 point CO2 18 WBC 9.9 Hemoglobin 15 Left shift neutrophilia  Radiology Studies: DG Chest Port 1 View  Result Date: 05/01/2020 CLINICAL DATA:  Seizure like activity EXAM: PORTABLE CHEST 1 VIEW COMPARISON:  03/05/2020 FINDINGS: The heart size and mediastinal contours are within normal limits. Both lungs are clear. Low lung volumes. The visualized skeletal structures are unremarkable. IMPRESSION: No active disease. Electronically Signed   By: Donavan Foil M.D.   On: 05/01/2020 22:03   Scheduled Meds: . apixaban  5 mg Oral BID  . donepezil  10 mg Oral q AM  . insulin aspart  0-5 Units Subcutaneous QHS  . insulin aspart  0-9 Units Subcutaneous TID  WC  Cntinuous Infusions: . sodium chloride 125 mL/hr at 05/02/20 0956  . cefTRIAXone (ROCEPHIN)  IV Stopped (05/01/20 2344)    LOS: 0 days   Time spent: 47  Nita Sells, MD [facility] To contact the attending provider between 7A-7P or the covering provider during after hours 7P-7A, please log into the web site [email or web address] and access using universal [place] password for that web site. If you do not have the password, please call the hospital operator.  05/02/2020, 7:29 AM

## 2020-05-02 NOTE — Progress Notes (Signed)
PHARMACY - PHYSICIAN COMMUNICATION CRITICAL VALUE ALERT - BLOOD CULTURE IDENTIFICATION (BCID)  Tony Fox is an 79 y.o. male who presented to [facility] on 05/01/2020 with a chief complaint of chills  Assessment:  urosepsis  Name of physician (or Provider) Contacted: Samtani via Secure Chat  Current antibiotics: Ceftriaxone 1 gm IV q24  Changes to prescribed antibiotics recommended:  Ceftriaxone increased to 2 gm IV q24  Results for orders placed or performed during the hospital encounter of 05/01/20  Blood Culture ID Panel (Reflexed) (Collected: 05/01/2020  9:36 PM)  Result Value Ref Range   Enterococcus species NOT DETECTED NOT DETECTED   Listeria monocytogenes NOT DETECTED NOT DETECTED   Staphylococcus species NOT DETECTED NOT DETECTED   Staphylococcus aureus (BCID) NOT DETECTED NOT DETECTED   Streptococcus species NOT DETECTED NOT DETECTED   Streptococcus agalactiae NOT DETECTED NOT DETECTED   Streptococcus pneumoniae NOT DETECTED NOT DETECTED   Streptococcus pyogenes NOT DETECTED NOT DETECTED   Acinetobacter baumannii NOT DETECTED NOT DETECTED   Enterobacteriaceae species DETECTED (A) NOT DETECTED   Enterobacter cloacae complex NOT DETECTED NOT DETECTED   Escherichia coli NOT DETECTED NOT DETECTED   Klebsiella oxytoca NOT DETECTED NOT DETECTED   Klebsiella pneumoniae DETECTED (A) NOT DETECTED   Proteus species NOT DETECTED NOT DETECTED   Serratia marcescens NOT DETECTED NOT DETECTED   Carbapenem resistance NOT DETECTED NOT DETECTED   Haemophilus influenzae NOT DETECTED NOT DETECTED   Neisseria meningitidis NOT DETECTED NOT DETECTED   Pseudomonas aeruginosa NOT DETECTED NOT DETECTED   Candida albicans NOT DETECTED NOT DETECTED   Candida glabrata NOT DETECTED NOT DETECTED   Candida krusei NOT DETECTED NOT DETECTED   Candida parapsilosis NOT DETECTED NOT DETECTED   Candida tropicalis NOT DETECTED NOT DETECTED    [name], Pharm.D 05/02/2020 4:38 PM

## 2020-05-02 NOTE — H&P (Signed)
History and Physical    Tony Fox ZOX:[id number] DOB: [birth year] DOA: 05/01/2020  PCP: Willey Blade, MD Patient coming from: Home  Chief Complaint: Chills  HPI: Tony Fox is a 79 y.o. male with medical history significant of dementia, BPH, GERD, hypertension, hyperlipidemia, OSA on CPAP, noninsulin-dependent type 2 diabetes, CKD stage III, history of right lower extremity DVT on Eliquis presenting to the ED via EMS.  They were called out to the house because family was concerned about patient having seizures.  EMS witnessed patient having chills which family reported that the patient has had several times in the last 24 hours.  He has a prior history of UTI and EMS reported foul-smelling urine at the scene when patient was incontinent.  Patient is not sure why he is here.  Oriented to self only.  He has no complaints.  Denies cough, shortness of breath, chest pain, nausea, vomiting, abdominal pain, diarrhea, or dysuria.  ED Course: Rectal temperature 101.8.  Tachycardic.  Labs showing no leukocytosis.  Lactate 6.4.  BUN 44, creatinine 3.0.  Baseline creatinine around 1.8.  UA with large amount of leukocytes, greater than 50 WBCs, and many bacteria.  Urine culture pending.  Blood culture x2 pending.  SARS-CoV-2 PCR test negative.  Chest x-ray showing no active disease.  Patient was given ceftriaxone, Tylenol, and 3 L LR boluses per sepsis protocol.  Review of Systems:  All systems reviewed and apart from history of presenting illness, are negative.  Past Medical History:  Diagnosis Date  . Arthritis   . Borderline diabetes mellitus   . BPH (benign prostatic hypertrophy)   . DDD (degenerative disc disease), lumbar   . GERD (gastroesophageal reflux disease)   . History of bladder cancer   . Hyperlipidemia   . Hypertension   . Nocturia   . OSA on CPAP    PER STUDY  05/2007  MODERATE OSA  . Wears glasses     Past Surgical History:  Procedure Laterality Date  .  CYSTOSCOPY W/ RETROGRADES Bilateral 12/30/2014   Procedure: CYSTOSCOPY WITH RETROGRADE PYELOGRAM;  Surgeon: Arvil Persons, MD;  Location: [facility];  Service: Urology;  Laterality: Bilateral;  . CYSTOSCOPY WITH BIOPSY N/A 03/07/2014   Procedure: CYSTOSCOPY WITH Bladder Washings and Bladder BIOPSY;  Surgeon: Lowella Bandy, MD;  Location: [facility];  Service: Urology;  Laterality: N/A;  . CYSTOSCOPY WITH BIOPSY N/A 12/30/2014   Procedure: CYSTOSCOPY WITH BIOPSY;  Surgeon: Arvil Persons, MD;  Location: [facility];  Service: Urology;  Laterality: N/A;  . TRANSURETHRAL RESECTION OF BLADDER TUMOR  12-30-2008//   11-18-2008//   10-08-2008  . TRANSURETHRAL RESECTION OF PROSTATE N/A 12/30/2014   Procedure: TRANSURETHRAL RESECTION PROSTATE BIOPSY;  Surgeon: Arvil Persons, MD;  Location: [facility];  Service: Urology;  Laterality: N/A;     reports that he has never smoked. He has never used smokeless tobacco. He reports that he does not drink alcohol and does not use drugs.  No Known Allergies  Family History  Problem Relation Age of Onset  . Alzheimer's disease Mother   . Stroke Father     Prior to Admission medications   Medication Sig Start Date End Date Taking? Authorizing Provider  acetaminophen (TYLENOL) 500 MG tablet Take 1,000 mg by mouth every 6 (six) hours as needed (for pain).    [provider]  apixaban (ELIQUIS) 5 MG TABS tablet Take 1 tablet (5 mg total) by mouth 2 (  two) times daily. 03/12/20   Little Ishikawa, MD  ascorbic acid (VITAMIN C) 500 MG tablet Take 500 mg by mouth 2 (two) times a week.     [provider]  aspirin EC 81 MG tablet Take 81 mg by mouth daily.    [provider]  Cholecalciferol (VITAMIN D-3 PO) Take 1 capsule by mouth See admin instructions. Take 1 capsule by mouth 2 times a week- on Mondays and Fridays    [provider]  donepezil (ARICEPT) 10 MG tablet Take 10 mg by  mouth in the morning.    [provider]  furosemide (LASIX) 20 MG tablet Take 20 mg by mouth in the morning.    [provider]  glipizide-metformin (METAGLIP) 2.5-250 MG tablet Take 1 tablet by mouth daily.    [provider]  liver oil-zinc oxide (DESITIN) 40 % ointment Apply 1 application topically as needed for irritation (to affected sites).    [provider]  NON FORMULARY Diet - Liquids: _X__Regular; ___Thickened ___ Consistency: ___ Nectar, ___Honey, ___ Pudding; ____ Fluid Restriction General Diet: NAS CON CHO    [provider]    Physical Exam: Vitals:   05/01/20 2100 05/01/20 2300 05/01/20 2330 05/02/20 0000  BP: 115/70 116/73 122/66 118/71  Pulse: 96 95 88 87  Resp:  15 (!) 25 15  Temp:      TempSrc:      SpO2: 93% 93% 97% 99%    Physical Exam Constitutional:      General: He is not in acute distress. HENT:     Head: Normocephalic.     Mouth/Throat:     Mouth: Mucous membranes are dry.  Eyes:     Extraocular Movements: Extraocular movements intact.  Cardiovascular:     Rate and Rhythm: Normal rate and regular rhythm.     Pulses: Normal pulses.  Pulmonary:     Effort: Pulmonary effort is normal. No respiratory distress.     Breath sounds: Normal breath sounds. No wheezing or rales.  Abdominal:     General: Bowel sounds are normal. There is no distension.     Palpations: Abdomen is soft.     Tenderness: There is no abdominal tenderness. There is no guarding.  Musculoskeletal:        General: No swelling.     Cervical back: Neck supple.  Skin:    General: Skin is warm and dry.  Neurological:     General: No focal deficit present.     Mental Status: He is alert.     Comments: Awake and alert Oriented to self only     Labs on Admission: I have personally reviewed following labs and imaging studies  CBC: Recent Labs  Lab 05/01/20 2136  WBC 9.9  NEUTROABS 9.3*  HGB 15.0  HCT 45.8  MCV 91.2  PLT 353    Basic Metabolic Panel: Recent Labs  Lab 05/01/20 2136  NA 144  K 4.4  CL 106  CO2 24  GLUCOSE 150*  BUN 44*  CREATININE 3.09*  CALCIUM 9.3   GFR: CrCl cannot be calculated (Unknown ideal weight.). Liver Function Tests: Recent Labs  Lab 05/01/20 2136  AST 22  ALT 13  ALKPHOS 66  BILITOT 1.1  PROT 7.5  ALBUMIN 3.6   No results for input(s): LIPASE, AMYLASE in the last 168 hours. No results for input(s): AMMONIA in the last 168 hours. Coagulation Profile: Recent Labs  Lab 05/01/20 2136  INR 1.1  Cardiac Enzymes: No results for input(s): CKTOTAL, CKMB, CKMBINDEX, TROPONINI in the last 168 hours. BNP (last 3 results) No results for input(s): PROBNP in the last 8760 hours. HbA1C: No results for input(s): HGBA1C in the last 72 hours. CBG: No results for input(s): GLUCAP in the last 168 hours. Lipid Profile: No results for input(s): CHOL, HDL, LDLCALC, TRIG, CHOLHDL, LDLDIRECT in the last 72 hours. Thyroid Function Tests: No results for input(s): TSH, T4TOTAL, FREET4, T3FREE, THYROIDAB in the last 72 hours. Anemia Panel: No results for input(s): VITAMINB12, FOLATE, FERRITIN, TIBC, IRON, RETICCTPCT in the last 72 hours. Urine analysis:    Component Value Date/Time   COLORURINE YELLOW 05/01/2020 2136   APPEARANCEUR HAZY (A) 05/01/2020 2136   LABSPEC 1.012 05/01/2020 2136   PHURINE 7.0 05/01/2020 2136   GLUCOSEU NEGATIVE 05/01/2020 2136   HGBUR SMALL (A) 05/01/2020 2136   BILIRUBINUR NEGATIVE 05/01/2020 2136   KETONESUR NEGATIVE 05/01/2020 2136   PROTEINUR 30 (A) 05/01/2020 2136   UROBILINOGEN 0.2 09/23/2008 0237   NITRITE NEGATIVE 05/01/2020 2136   LEUKOCYTESUR LARGE (A) 05/01/2020 2136    Radiological Exams on Admission: DG Chest Port 1 View  Result Date: 05/01/2020 CLINICAL DATA:  Seizure like activity EXAM: PORTABLE CHEST 1 VIEW COMPARISON:  03/05/2020 FINDINGS: The heart size and mediastinal contours are within normal limits. Both lungs are clear.  Low lung volumes. The visualized skeletal structures are unremarkable. IMPRESSION: No active disease. Electronically Signed   By: Donavan Foil M.D.   On: 05/01/2020 22:03    EKG: Independently reviewed.  Sinus tachycardia, QTC 560.  No significant change since prior tracing.  Assessment/Plan Principal Problem:   UTI (urinary tract infection) Active Problems:   OSA (obstructive sleep apnea)   Type 2 diabetes mellitus (HCC)   Severe sepsis (HCC)   Acute renal failure superimposed on chronic kidney disease (HCC)   Severe sepsis secondary to UTI: Febrile and tachycardic.  Labs showing significant lactic acidosis (lactate 6.4).  UA with large amount of leukocytes, greater than 50 WBCs, and many bacteria. -Continue ceftriaxone.  Received fluid boluses per sepsis protocol.  Urine and blood cultures pending.  Repeat lactic acid level.  AKI on CKD stage III: Likely prerenal due to dehydration in setting of acute illness/severe sepsis.  BUN 44, creatinine 3.0.  Baseline creatinine around 1.8. -IV fluid hydration.  Monitor renal function and urine output.  Avoid nephrotoxic agents/contrast.  Metabolic encephalopathy in setting of baseline dementia: Likely due to UTI and dehydration.  Currently oriented to self only.  No focal neuro deficit. -Continue management of UTI as mentioned above.  Follow delirium precautions.  History of right lower extremity DVT -Resume Eliquis after pharmacy med rec is done  Hypertension -Hold antihypertensives in the setting of severe sepsis.  OSA -Continue CPAP at night  Noninsulin-dependent type 2 diabetes: A1c 6.8 on 03/06/2020. -Sliding scale insulin sensitive ACHS and CBG checks.  Physical deconditioning -PT/OT eval for placement  QT prolongation on EKG -Cardiac monitoring, keep potassium above 4 and magnesium above 2, avoid QT prolonging drugs if possible, and repeat EKG in a.m.  DVT prophylaxis: Resume Eliquis after pharmacy med rec is done Code  Status: DNR per review of prior hospital records Family Communication: No family available at this time. Disposition Plan: Status is: Inpatient  Remains inpatient appropriate because:IV treatments appropriate due to intensity of illness or inability to take PO and Inpatient level of care appropriate due to severity of illness   Dispo: The patient is from: Home  Anticipated d/c is to: SNF              Anticipated d/c date is: > 3 days              Patient currently is not medically stable to d/c.  The medical decision making on this patient was of high complexity and the patient is at high risk for clinical deterioration, therefore this is a level 3 visit.  Shela Leff MD [facility]  If 7PM-7AM, please contact night-coverage [email or web address]  05/02/2020, 12:42 AM

## 2020-05-02 NOTE — ED Notes (Signed)
Provider notified of lab vale Calcium 6.2

## 2020-05-02 NOTE — Plan of Care (Signed)
  Problem: Urinary Elimination: Goal: Signs and symptoms of infection will decrease Outcome: Progressing   Problem: Education: Goal: Knowledge of General Education information will improve Description: Including pain rating scale, medication(s)/side effects and non-pharmacologic comfort measures Outcome: Progressing   Problem: Health Behavior/Discharge Planning: Goal: Ability to manage health-related needs will improve Outcome: Progressing   Problem: Clinical Measurements: Goal: Ability to maintain clinical measurements within normal limits will improve Outcome: Progressing Goal: Will remain free from infection Outcome: Progressing Goal: Diagnostic test results will improve Outcome: Progressing Goal: Respiratory complications will improve Outcome: Progressing Goal: Cardiovascular complication will be avoided Outcome: Progressing

## 2020-05-02 NOTE — ED Notes (Signed)
Pt. CBG 106, RN, Jeneen made aware.

## 2020-05-02 NOTE — Progress Notes (Signed)
Code sepsis note:  BC drawn before ABX administered. Received 3,000 ml IVF bolus and 4th liter ordered. LA was 6.4 and contacted  ED RN at 00:57 AM to draw second LA. Second LA 4.8. Will need a repeat LA after 4th bolus. Being admitted to Telemetry unit.   Langford Carias DNP Elink RN 02:21 AM

## 2020-05-02 NOTE — ED Notes (Signed)
Patient fed meal tray.

## 2020-05-03 ENCOUNTER — Inpatient Hospital Stay (HOSPITAL_COMMUNITY): Payer: Medicare PPO

## 2020-05-03 LAB — CBC WITH DIFFERENTIAL/PLATELET
Abs Immature Granulocytes: 0.02 10*3/uL (ref 0.00–0.07)
Basophils Absolute: 0 10*3/uL (ref 0.0–0.1)
Basophils Relative: 0 %
Eosinophils Absolute: 0.1 10*3/uL (ref 0.0–0.5)
Eosinophils Relative: 2 %
HCT: 36.4 % — ABNORMAL LOW (ref 39.0–52.0)
Hemoglobin: 11.7 g/dL — ABNORMAL LOW (ref 13.0–17.0)
Immature Granulocytes: 0 %
Lymphocytes Relative: 26 %
Lymphs Abs: 1.4 10*3/uL (ref 0.7–4.0)
MCH: 29.5 pg (ref 26.0–34.0)
MCHC: 32.1 g/dL (ref 30.0–36.0)
MCV: 91.9 fL (ref 80.0–100.0)
Monocytes Absolute: 0.8 10*3/uL (ref 0.1–1.0)
Monocytes Relative: 14 %
Neutro Abs: 3 10*3/uL (ref 1.7–7.7)
Neutrophils Relative %: 58 %
Platelets: 141 10*3/uL — ABNORMAL LOW (ref 150–400)
RBC: 3.96 MIL/uL — ABNORMAL LOW (ref 4.22–5.81)
RDW: 14.2 % (ref 11.5–15.5)
WBC: 5.3 10*3/uL (ref 4.0–10.5)
nRBC: 0 % (ref 0.0–0.2)

## 2020-05-03 LAB — COMPREHENSIVE METABOLIC PANEL
ALT: 13 U/L (ref 0–44)
AST: 19 U/L (ref 15–41)
Albumin: 2.8 g/dL — ABNORMAL LOW (ref 3.5–5.0)
Alkaline Phosphatase: 51 U/L (ref 38–126)
Anion gap: 12 (ref 5–15)
BUN: 34 mg/dL — ABNORMAL HIGH (ref 8–23)
CO2: 21 mmol/L — ABNORMAL LOW (ref 22–32)
Calcium: 8.9 mg/dL (ref 8.9–10.3)
Chloride: 109 mmol/L (ref 98–111)
Creatinine, Ser: 2.16 mg/dL — ABNORMAL HIGH (ref 0.61–1.24)
GFR calc Af Amer: 32 mL/min — ABNORMAL LOW (ref >60)
GFR calc non Af Amer: 28 mL/min — ABNORMAL LOW (ref >60)
Glucose, Bld: 105 mg/dL — ABNORMAL HIGH (ref 70–99)
Potassium: 4.2 mmol/L (ref 3.5–5.1)
Sodium: 142 mmol/L (ref 135–145)
Total Bilirubin: 0.9 mg/dL (ref 0.3–1.2)
Total Protein: 6.2 g/dL — ABNORMAL LOW (ref 6.5–8.1)

## 2020-05-03 LAB — GLUCOSE, CAPILLARY
Glucose-Capillary: 89 mg/dL (ref 70–99)
Glucose-Capillary: 119 mg/dL — ABNORMAL HIGH (ref 70–99)
Glucose-Capillary: 129 mg/dL — ABNORMAL HIGH (ref 70–99)
Glucose-Capillary: 104 mg/dL — ABNORMAL HIGH (ref 70–99)

## 2020-05-03 LAB — MAGNESIUM: Magnesium: 1.8 mg/dL (ref 1.7–2.4)

## 2020-05-03 NOTE — Progress Notes (Signed)
Palliative Note:  Referral received for goals of care discussion. Chart reviewed and updates received from RN. Patient assessed at bedside. Wife and son recently visiting however, wife was not feeling well and needed to leave to go home and rest.   I introduced myself and Palliative's role in the care of Tony Fox while hospitalized. Wife verbalized appreciation. She would like to meet tomorrow at 2pm for continued [place] discussions.   Detailed note and recommendation to follow [place] meeting tomorrow 05/04/20 @ 2pm.   Thank you for your referral and allowing Palliative to assist in Tony Fox's care.   Alda Lea, AGPCNP-BC Palliative Medicine Team  Phone: [phone] Pager: [phone] Amion: N. [place]    NO CHARGE

## 2020-05-03 NOTE — Progress Notes (Signed)
Spoke to patient regarding usage of CPAP.  Pt. States that he thinks he usually does not wear at home and does not want to wear device here.  Spoke with patients RN who agrees that pt. Reaction time is slow and might be at risk wearing CPAP device if unable to remove in case of emergent situation.  Again, patient refuses device at this time.

## 2020-05-03 NOTE — Evaluation (Signed)
Occupational Therapy Evaluation Patient Details Name: Tony Fox MRN: [id number] DOB: Dec 08, 1940 Today's Date: 05/03/2020    History of Present Illness  79 y.o. male with medical history significant of dementia, BPH, GERD, hypertension, hyperlipidemia, OSA on CPAP, noninsulin-dependent type 2 diabetes, CKD stage III, history of right lower extremity DVT on Eliquis admitted via EMS with family concerned patient having seizures. Patient diagnosed with Severe sepsis secondary to UTI   Clinical Impression   Patient with functional deficits listed below impacting safety and independence with self care. Patient is unable to provide reliable history, perseverating on telling me he is from [facility]. Patient requires increased time and mod to max cues for task initiation and sequencing. Max A for bed mobility, attempt max A x1 with bed height elevated to stand however unable. Per previous chart reviews patient was able to ambulate with walker at home and has assist with BADLs from spouse. Currently recommend SNF due to significant physical assist required for mobility. Will continue to follow.    Follow Up Recommendations  SNF;Supervision/Assistance - 24 hour    Equipment Recommendations  Other (comment) (TBD)       Precautions / Restrictions Precautions Precautions: Fall Precaution Comments: hx dementia Restrictions Weight Bearing Restrictions: No      Mobility Bed Mobility Overal bed mobility: Needs Assistance Bed Mobility: Supine to Sit;Sit to Supine     Supine to sit: Max assist;HOB elevated Sit to supine: Max assist;+2 for physical assistance;+2 for safety/equipment;Mod assist   General bed mobility comments: increased time for all task initiation, poor problem solving and sequencing  Transfers                 General transfer comment: unable with bed height elevated and strong x1 assist    Balance Overall balance assessment: Needs assistance Sitting-balance  support: Feet supported;Single extremity supported Sitting balance-Leahy Scale: Fair Sitting balance - Comments: close S       Standing balance comment: unable                           ADL either performed or assessed with clinical judgement   ADL Overall ADL's : Needs assistance/impaired     Grooming: Wash/dry face;Supervision/safety;Sitting   Upper Body Bathing: Minimal assistance;Moderate assistance;Sitting   Lower Body Bathing: Maximal assistance;Total assistance;Sitting/lateral leans;Bed level   Upper Body Dressing : Minimal assistance;Moderate assistance;Sitting   Lower Body Dressing: Maximal assistance;Total assistance;Sitting/lateral leans;Bed level   Toilet Transfer: +2 for physical assistance;+2 for safety/equipment Toilet Transfer Details (indicate cue type and reason): unable to stand with bed elevated and x1 assist, anticipate x2 assist for transfer to commode Toileting- Clothing Manipulation and Hygiene: Total assistance         General ADL Comments: per chart review patient's spouse has been providing assistance for self care tasks for extended time, but was able to ambulate with AD. Currently patient unable to assist much with bed mobility or come to standing with x1 assist                  Pertinent Vitals/Pain Pain Assessment: Faces Faces Pain Scale: Hurts little more Pain Location: scrotum with bed mob Pain Descriptors / Indicators: Grimacing;Discomfort Pain Intervention(s): Repositioned     Hand Dominance Left   Extremity/Trunk Assessment Upper Extremity Assessment Upper Extremity Assessment: Generalized weakness   Lower Extremity Assessment Lower Extremity Assessment: Defer to PT evaluation   Cervical / Trunk Assessment Cervical / Trunk Assessment: Kyphotic  Communication Communication Communication: No difficulties   Cognition Arousal/Alertness: Awake/alert Behavior During Therapy: WFL for tasks  assessed/performed Overall Cognitive Status: History of cognitive impairments - at baseline Area of Impairment: Orientation;Following commands;Safety/judgement;Problem solving;Memory                 Orientation Level: Disoriented to;Situation;Time;Place   Memory: Decreased short-term memory Following Commands: Follows one step commands inconsistently;Follows one step commands with increased time Safety/Judgement: Decreased awareness of safety;Decreased awareness of deficits   Problem Solving: Slow processing;Decreased initiation;Difficulty sequencing;Requires verbal cues;Requires tactile cues General Comments: patient is tangential and perseverating on telling me he lived in [place] "when I was younger"              Home Living Family/patient expects to be discharged to:: Private residence Living Arrangements: Spouse/significant other Available Help at Discharge: Family Type of Home: House                           Additional Comments: Pt unable to give any details about home; info listed from previous eval      Prior Functioning/Environment Level of Independence: Needs assistance  Gait / Transfers Assistance Needed: Per chart review, he uses a cane or walker for ambulation ADL's / Homemaking Assistance Needed: wife assist   Comments: As of OT note dated 12/05/19: wife has been helping pt with adls for quite some time. He could get up to the bathroom and go to the kitchen to get what he wanted. Lately she and sons have been helping him try to get up        OT Problem List: Decreased strength;Decreased activity tolerance;Impaired balance (sitting and/or standing);Decreased cognition;Decreased safety awareness      OT Treatment/Interventions: Self-care/ADL training;Therapeutic exercise;Therapeutic activities;Cognitive remediation/compensation;Patient/family education;Balance training    OT Goals(Current goals can be found in the care plan section) Acute  Rehab OT Goals Patient Stated Goal: "Stand up? I think I can do that" OT Goal Formulation: With patient Time For Goal Achievement: 05/17/20 Potential to Achieve Goals: Good  OT Frequency: Min 2X/week    AM-PAC OT "6 Clicks" Daily Activity     Outcome Measure Help from another person eating meals?: A Little Help from another person taking care of personal grooming?: A Little Help from another person toileting, which includes using toliet, bedpan, or urinal?: Total Help from another person bathing (including washing, rinsing, drying)?: A Lot Help from another person to put on and taking off regular upper body clothing?: A Little Help from another person to put on and taking off regular lower body clothing?: Total 6 Click Score: 13   End of Session Nurse Communication: Mobility status  Activity Tolerance: Patient tolerated treatment well Patient left: in bed;with call bell/phone within reach;with bed alarm set  OT Visit Diagnosis: Unsteadiness on feet (R26.81);Other abnormalities of gait and mobility (R26.89);Muscle weakness (generalized) (M62.81);Other symptoms and signs involving cognitive function                Time: 7425-9563 OT Time Calculation (min): 26 min Charges:  OT General Charges $OT Visit: 1 Visit OT Evaluation $OT Eval Moderate Complexity: 1 Mod OT Treatments $Self Care/Home Management : 8-22 mins  Delbert Phenix OT Pager: [phone]  Rosemary Holms 05/03/2020, 12:41 PM

## 2020-05-03 NOTE — Evaluation (Addendum)
Physical Therapy Evaluation Patient Details Name: Tony Fox MRN: [id number] DOB: 1941-02-06 Today's Date: 05/03/2020   History of Present Illness   79 y.o. male with medical history significant of dementia, BPH, GERD, hypertension, hyperlipidemia, OSA on CPAP, noninsulin-dependent type 2 diabetes, CKD stage III, history of right lower extremity DVT on Eliquis admitted via EMS with family concerned patient having seizures. Patient diagnosed with Severe sepsis secondary to UTI  Clinical Impression  On eval, pt required Max assist +2 for mobility. He was unable to stand despite having +2 assist. Total assist for hygiene after incontinent episode during session. Pt's participation fluctuates and he has intermittent episodes of mild-mod agitation. He was able to be redirected once agitated. No family was present during session. Will follow during hospital stay and progress activity as able.      Follow Up Recommendations SNF; 24 hour supervision/assist    Equipment Recommendations  None recommended by PT    Recommendations for Other Services       Precautions / Restrictions Precautions Precautions: Fall Precaution Comments: dementia- agitated at times but able to be redirected Restrictions Weight Bearing Restrictions: No      Mobility  Bed Mobility Overal bed mobility: Needs Assistance Bed Mobility: Rolling;Supine to Sit;Sit to Supine Rolling: Max assist;+2 for physical assistance;+2 for safety/equipment (to R side. Unable to get pt to roll to L side)   Supine to sit: Max assist;+2 for physical assistance;+2 for safety/equipment Sit to supine: Mod assist   General bed mobility comments: +2 supine>sit; +1 sit>supine. Unable to get pt to roll to L side (for proper pad placement) despite multimodal cueing and increased time. Called RN in to room to assist but she was unable to get pt to roll either. Pt is resistive and very strong. She stated to just leave pt as is and they would  reattempt later to see if he would be more participatory/agreeable.  Transfers                 General transfer comment: Attempted-pt unable even with +2 assist. Pt was unable to keep feet within proper BOS (feet shoot forward despite cues and blocking by therapist)  Ambulation/Gait                Stairs            Wheelchair Mobility    Modified Rankin (Stroke Patients Only)       Balance Overall balance assessment: Needs assistance Sitting-balance support: Bilateral upper extremity supported;Feet supported Sitting balance-Leahy Scale: Fair Sitting balance - Comments: Min guard for safety but no LOB observed     Standing balance-Leahy Scale: Zero Standing balance comment: unable                             Pertinent Vitals/Pain Pain Assessment: Faces Faces Pain Scale: Hurts little more Pain Location: scrotum with bed mob Pain Descriptors / Indicators: Grimacing;Discomfort Pain Intervention(s): Limited activity within patient's tolerance;Monitored during session;Repositioned    Home Living Family/patient expects to be discharged to:: Private residence Living Arrangements: Spouse/significant other Available Help at Discharge: Family Type of Home: House           Additional Comments: Pt unable to give any details about home; info listed from previous eval    Prior Function Level of Independence: Needs assistance   Gait / Transfers Assistance Needed: unsure of PLOF-pt is a poor historian.no family available  ADL's / Homemaking Assistance Needed:  wife assists  Comments: As of OT note dated 12/05/19: wife has been helping pt with adls for quite some time. He could get up to the bathroom and go to the kitchen to get what he wanted. Lately she and sons have been helping him try to get up     Hand Dominance   Dominant Hand: Left    Extremity/Trunk Assessment   Upper Extremity Assessment Upper Extremity Assessment: Defer to OT  evaluation    Lower Extremity Assessment Lower Extremity Assessment: Generalized weakness    Cervical / Trunk Assessment Cervical / Trunk Assessment: Kyphotic  Communication   Communication: No difficulties  Cognition Arousal/Alertness: Awake/alert Behavior During Therapy: WFL for tasks assessed/performed (with brief mild periods of agitation) Overall Cognitive Status: History of cognitive impairments - at baseline Area of Impairment: Orientation;Following commands;Safety/judgement;Problem solving;Memory;Attention                 Orientation Level: Disoriented to;Situation;Time;Place Current Attention Level: Focused Memory: Decreased short-term memory Following Commands: Follows one step commands inconsistently Safety/Judgement: Decreased awareness of safety;Decreased awareness of deficits   Problem Solving: Slow processing;Decreased initiation;Difficulty sequencing;Requires verbal cues;Requires tactile cues General Comments: pt is tangential with conversation and has trouble staying on task      General Comments      Exercises     Assessment/Plan    PT Assessment Patient needs continued PT services  PT Problem List Decreased strength;Decreased mobility;Decreased activity tolerance;Decreased cognition;Decreased balance;Decreased knowledge of use of DME;Decreased safety awareness       PT Treatment Interventions DME instruction;Gait training;Therapeutic activities;Therapeutic exercise;Patient/family education;Balance training;Functional mobility training    PT Goals (Current goals can be found in the Care Plan section)  Acute Rehab PT Goals Patient Stated Goal: none stated PT Goal Formulation: Patient unable to participate in goal setting Time For Goal Achievement: 05/17/20 Potential to Achieve Goals: Fair    Frequency Min 2X/week   Barriers to discharge        Co-evaluation               AM-PAC PT "6 Clicks" Mobility  Outcome Measure Help needed  turning from your back to your side while in a flat bed without using bedrails?: Total Help needed moving from lying on your back to sitting on the side of a flat bed without using bedrails?: Total Help needed moving to and from a bed to a chair (including a wheelchair)?: Total Help needed standing up from a chair using your arms (e.g., wheelchair or bedside chair)?: Total Help needed to walk in hospital room?: Total Help needed climbing 3-5 steps with a railing? : Total 6 Click Score: 6    End of Session   Activity Tolerance:  (limited by cognition) Patient left: in bed;with call bell/phone within reach (with RN in room)   PT Visit Diagnosis: Muscle weakness (generalized) (M62.81);Difficulty in walking, not elsewhere classified (R26.2)    Time: 1212-1250 PT Time Calculation (min) (ACUTE ONLY): 38 min   Charges:   PT Evaluation $PT Eval Moderate Complexity: 1 Mod PT Treatments $Therapeutic Activity: 8-22 mins          Doreatha Massed, PT Acute Rehabilitation  Office: [phone] Pager: [phone]

## 2020-05-03 NOTE — Progress Notes (Signed)
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 Ellouise Newer neurologist 07/16/2015 MoCA score 27/30 at that time BPH with bladder cancer in addition-TUR bladder 2009 high-grade TCC Insulin-dependent DM TY 2 RLE DVT 12/04/2019  Last admission 2/95/1884-->1/66/0630 toxic metabolic encephalopathy superimposed on dementia secondary to strep viridans UTI-patient was treated with antibiotics-ID was curb sided and did not feel that an echocardiogram was needed and patient was discharged to home with home health given his significant dementia.  Presented to [facility] ED 6/18?  Seizures per family report Febrile, tachycardic-septic-T-max 101.8 lactic acid 6.4 AKI creatinine 3.09 (baseline 1.7)   Assessment & Plan:   Principal Problem:   UTI (urinary tract infection) Active Problems:   OSA (obstructive sleep apnea)   Type 2 diabetes mellitus (HCC)   Severe sepsis (HCC)   Acute renal failure superimposed on chronic kidney disease ([place])   1. Severe sepsis l 2/2 Klebsiella pneumonia UTI a. Saline 125 cc/h- ? to 50 cc/h 6/20 b. Empiric ceftriaxone 1 g every 24 c. XR 2 view 6/20 my over read no concern for aspiration 2. AKI secondary to above 3. mild metabolic acidosis probably from sepsis but also on Metformin therefore component of type B metabolic acidosis in addition a. He was at home on a saline Gtt apparently per PCP b. Labs a.m. holding PTA Lasix c. 20 daily in addition to hypoglycemic agents 4. Prior bladder tumor/BPH status post surgery 2009 a. Not on Flomax?-Consider the same if unable to pass urine 5. Prior DVT 11/2019 a. Resume apixaban 5 twice daily b. No aspirin at this time and have held it on MAR 6. NIDDM a. At home seems to be on Metaglip b. Component of this can cause metabolic acidosis and would  discontinue completely Metformin from his regimen given his age and risk for side effects c. Do not cover unless sugars persistently above 180 7. Toxic metabolic encephalopathy/moderate to severe dementia stage III-IV a. Resume Aricept 10 every morning b. Mentation has improved 6/20 8. HTN a. History of 9. OSA a. Attempt CPAP at night as per orders 10. QTC prolongation a. Keep on telemetry today and monitor trends as needed    DVT prophylaxis: Apixaban 5 twice daily Code Status: DNR Family Communication:  spoke with son 6/19 --had a UTI late jan and started declining--went from walking and moving around and couldn't get up-they have CNA's helping out at home--this may be from his sciatica and lbp--he is gotten up x 2 per week by sons--most of the time if is a challenge to get him up They are attempting to get a lift bed CNA's come out every am and to ensure that he gets ckleaned up 3-4 days a week they have an RN Disposition:  Status is: Inpatient  Remains inpatient appropriate because:Hemodynamically unstable, Persistent severe electrolyte disturbances, Ongoing active pain requiring inpatient pain management and IV treatments appropriate due to intensity of illness or inability to take PO   Dispo: The patient is from: Home              Anticipated d/c is to: With max home health support as per my discussion with son on 6/19              Anticipated d/c date is: 3 days              Patient  currently is not medically stable to d/c. 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  Examination:  General exam: awake alert  arcus senilis + much clear Respiratory system: Clear to exam and auscultation Cardiovascular system: s1 s 2no m/r/g Gastrointestinal system: No rebound no guarding Central nervous system: intact moving limbs x 4 without deficit-power Extremities: ROM intact but difficulty raising legs Skin: No edema Psychiatry: Much less confused Data Reviewed: I have personally reviewed following labs and imaging studies  BUN/creatinine 44/3.09-->35/2.3-->34/2.1 Bicarb up from 18-21 Lactic acid 6.4-->4.8 down to 1.2 WBC 9.9-->5.3 Hemoglobin 15-->11.7 Left shift neutrophilia  Radiology Studies: DG Chest 2 View  Result Date: 05/03/2020 CLINICAL DATA:  Pneumonia EXAM: CHEST - 2 VIEW COMPARISON:  05/01/2020 FINDINGS: Normal cardiac silhouette with ectatic aorta. Low lung volumes. No effusion, infiltrate pneumothorax. No acute osseous abnormality. IMPRESSION: Low lung volumes. No acute findings. Electronically Signed   By: Suzy Bouchard M.D.   On: 05/03/2020 11:08   DG Chest Port 1 View  Result Date: 05/01/2020 CLINICAL DATA:  Seizure like activity EXAM: PORTABLE CHEST 1 VIEW COMPARISON:  03/05/2020 FINDINGS: The heart size and mediastinal contours are within normal limits. Both lungs are clear. Low lung volumes. The visualized skeletal structures are unremarkable. IMPRESSION: No active disease. Electronically Signed   By: Donavan Foil M.D.   On: 05/01/2020 22:03   Scheduled Meds: . apixaban  5 mg Oral BID  . donepezil  10 mg Oral q AM  . insulin aspart  0-5 Units Subcutaneous QHS  . insulin aspart  0-9 Units Subcutaneous TID WC    Cntinuous Infusions: . sodium chloride 50 mL/hr at 05/03/20 1310  . cefTRIAXone (ROCEPHIN)  IV 2 g (05/02/20 1912)    LOS: 1 day   Time spent: 25  Nita Sells, MD [facility] To contact the attending provider between 7A-7P or the covering provider during after hours 7P-7A, please log into the web site [email or web address] and access using universal [place] password for that web site. If you do not have the password, please call the hospital operator.  05/03/2020, 11:56 AM

## 2020-05-04 DIAGNOSIS — E1121 Type 2 diabetes mellitus with diabetic nephropathy: Secondary | ICD-10-CM

## 2020-05-04 DIAGNOSIS — A419 Sepsis, unspecified organism: Secondary | ICD-10-CM

## 2020-05-04 DIAGNOSIS — L8990 Pressure ulcer of unspecified site, unspecified stage: Secondary | ICD-10-CM | POA: Insufficient documentation

## 2020-05-04 DIAGNOSIS — Z515 Encounter for palliative care: Secondary | ICD-10-CM

## 2020-05-04 DIAGNOSIS — N1832 Chronic kidney disease, stage 3b: Secondary | ICD-10-CM

## 2020-05-04 DIAGNOSIS — Z66 Do not resuscitate: Secondary | ICD-10-CM

## 2020-05-04 DIAGNOSIS — Z7189 Other specified counseling: Secondary | ICD-10-CM

## 2020-05-04 LAB — CBC WITH DIFFERENTIAL/PLATELET
Abs Immature Granulocytes: 0.01 10*3/uL (ref 0.00–0.07)
Basophils Absolute: 0 10*3/uL (ref 0.0–0.1)
Basophils Relative: 0 %
Eosinophils Absolute: 0.2 10*3/uL (ref 0.0–0.5)
Eosinophils Relative: 3 %
HCT: 36.5 % — ABNORMAL LOW (ref 39.0–52.0)
Hemoglobin: 12 g/dL — ABNORMAL LOW (ref 13.0–17.0)
Immature Granulocytes: 0 %
Lymphocytes Relative: 41 %
Lymphs Abs: 1.8 10*3/uL (ref 0.7–4.0)
MCH: 29.8 pg (ref 26.0–34.0)
MCHC: 32.9 g/dL (ref 30.0–36.0)
MCV: 90.6 fL (ref 80.0–100.0)
Monocytes Absolute: 0.7 10*3/uL (ref 0.1–1.0)
Monocytes Relative: 15 %
Neutro Abs: 1.8 10*3/uL (ref 1.7–7.7)
Neutrophils Relative %: 41 %
Platelets: 157 10*3/uL (ref 150–400)
RBC: 4.03 MIL/uL — ABNORMAL LOW (ref 4.22–5.81)
RDW: 13.6 % (ref 11.5–15.5)
WBC: 4.5 10*3/uL (ref 4.0–10.5)
nRBC: 0 % (ref 0.0–0.2)

## 2020-05-04 LAB — GLUCOSE, CAPILLARY
Glucose-Capillary: 96 mg/dL (ref 70–99)
Glucose-Capillary: 170 mg/dL — ABNORMAL HIGH (ref 70–99)
Glucose-Capillary: 77 mg/dL (ref 70–99)
Glucose-Capillary: 101 mg/dL — ABNORMAL HIGH (ref 70–99)

## 2020-05-04 LAB — COMPREHENSIVE METABOLIC PANEL
ALT: 13 U/L (ref 0–44)
AST: 18 U/L (ref 15–41)
Albumin: 2.9 g/dL — ABNORMAL LOW (ref 3.5–5.0)
Alkaline Phosphatase: 49 U/L (ref 38–126)
Anion gap: 10 (ref 5–15)
BUN: 31 mg/dL — ABNORMAL HIGH (ref 8–23)
CO2: 20 mmol/L — ABNORMAL LOW (ref 22–32)
Calcium: 8.3 mg/dL — ABNORMAL LOW (ref 8.9–10.3)
Chloride: 107 mmol/L (ref 98–111)
Creatinine, Ser: 2.08 mg/dL — ABNORMAL HIGH (ref 0.61–1.24)
GFR calc Af Amer: 34 mL/min — ABNORMAL LOW (ref >60)
GFR calc non Af Amer: 29 mL/min — ABNORMAL LOW (ref >60)
Glucose, Bld: 98 mg/dL (ref 70–99)
Potassium: 4 mmol/L (ref 3.5–5.1)
Sodium: 137 mmol/L (ref 135–145)
Total Bilirubin: 0.7 mg/dL (ref 0.3–1.2)
Total Protein: 6.2 g/dL — ABNORMAL LOW (ref 6.5–8.1)

## 2020-05-04 LAB — URINE CULTURE: Culture: >=100000 — AB

## 2020-05-04 LAB — CULTURE, BLOOD (ROUTINE X 2): Special Requests: ADEQUATE

## 2020-05-04 MED ORDER — AMOXICILLIN-POT CLAVULANATE 875-125 MG PO TABS
1.0000 | ORAL_TABLET | Freq: Two times a day (BID) | ORAL | Status: DC
  Administered 2020-05-05 – 2020-05-06 (×4): 1 via ORAL
  Administered 2020-05-07: 0.5 via ORAL
  Filled 2020-05-04 (×5): qty 1

## 2020-05-04 NOTE — Consult Note (Signed)
Consultation Note Date: 05/04/2020   Patient Name: Tony Fox  DOB: 12/22/1940  MRN: [id number]  Age / Sex: 79 y.o., male   PCP: Willey Blade, MD Referring Physician: Nita Sells, MD   REASON FOR CONSULTATION:Establishing goals of care  Palliative Care consult requested for goals of care discussion in this 79 y.o. male with multiple medical problems including dementia, BPH, bladder cancer with recurrent UTIs, GERD, hypertension, hyperlipidemia, OSA on CPAP, noninsulin-dependent type 2 diabetes, CKD stage III, history of right lower extremity DVT (Eliquis). Patient presented to ED with concerns of seizure like activity, however EMS witnessed patient having chills which family reported similar occurrences over last 24 hours. During ED work-up found to have a rectal temperature of 101.8, tachycardia, and BUN 44/Cr 3.0. COVID-19 negative. Positive for UTI with pending urine and blood cultures. Patient was started on IV antibiotics. Since admission patient continues on antibiotics for severe sepsis secondary to E.Coli and pneumonia.   Clinical Assessment and Goals of Care: I have reviewed medical records including lab results, imaging, Epic notes, and MAR, received report from the bedside RN, and assessed the patient. I met at the bedside with patient's wife, Mrs. Keyshon Stein to discuss diagnosis prognosis, [place], EOL wishes, disposition and options.  Mr. Donaghey suffers from dementia. He is asleep but easily aroused on verbal stimuli. He is able to recognize his wife. Denies pain. He slept during most of my meeting with his wife.   I introduced Palliative Medicine as specialized medical care for people living with serious illness. It focuses on providing relief from the symptoms and stress of a serious illness. The goal is to improve quality of life for both the patient and the family. Wife verbalized understanding and appreciation.   We discussed a brief life review of the  patient, along with his functional and nutritional status. Mr. Hersh and his wife have been married for more than 52 years. He is a retired Production manager. His wife is the former Mayor of [place]. 2 of his children are also Attorneys and the Clerk of [employer]. He is of [place] faith. He enjoyed spending time with family.   Prior to admission patient resided in the home with wife and support from his children. Mrs. Nall reports they had hired care givers coming into the home daily in addition to PT previously. Patient required assistance with all ADLs. His son's would shower or bath patient. Wife reports he has a great appetite generally for breakfast and lunch. He would feed himself some however at dinner he would not have a huge appetite and required someone to sit with him and assist and encourage him to eat. He is incontinent of urine and stool.   We discussed His current illness and what it means in the larger context of His on-going co-morbidities. With specific discussions regarding his progression of dementia, recurrent UTIs, and over functional state. Natural disease trajectory and expectations at EOL were discussed.  Wife verbalized understanding of patient's current illness and condition. She remains hopeful sharing that patient was more interactive and could assist with some transfers several weeks ago. She understands the trajectory of his dementia and is not expecting significant changes but again expresses hope that he would return at minimum to his baseline and can return home eventually with normal routines with family and caregiver assistance.   I attempted to elicit values and goals of care important to the patient.    The difference between aggressive medical intervention and  comfort care was considered in light of the patient's goals of care.   Mrs. Eckenrode would like to continue to provide patient with an opportunity to improve. She again verbalizes her understanding  of his condition and the trajectory. She reports the goal is for comfort and no aggressive interventions however still allowing patient an opportunity to thrive as much as possible she expresses hopes that patient could be considered for SNF with rehab to see if he can increase in strength with awareness this may not occur and the goal would then to return home with family and continue with comfort care.   Wife shares patient does have a documented advanced directive. She is his HCPOA. Mrs. Hearne confirms wishes for DNR/DNI, no artificial feedings such as PEG, no aggressive treatments such as surgeries/dialysis, or ICU.   Hospice and Palliative Care services outpatient were explained and offered. Patient and family verbalized their understanding and awareness of both palliative and hospice's goals and philosophy of care. Wife verbalized understanding and is open to outpatient palliative initially with awareness patient can transition to hospice at anytime goals become full comfort or no further interventions in the home.   Questions and concerns were addressed.  Hard Choices booklet left for review. The family was encouraged to call with questions or concerns.  PMT will continue to support holistically.   SOCIAL HISTORY:     reports that he has never smoked. He has never used smokeless tobacco. He reports that he does not drink alcohol and does not use drugs.  CODE STATUS: DNR  ADVANCE DIRECTIVES: Primary Decision Maker: Rayna Sexton HCPOA: NO    SYMPTOM MANAGEMENT: per attending   Palliative Prophylaxis:   Aspiration, Bowel Regimen, Delirium Protocol, Eye Care, Frequent Pain Assessment, Oral Care and Turn Reposition  PSYCHO-SOCIAL/SPIRITUAL:  Support System: Family  Desire for further Chaplaincy support: NO   Additional Recommendations (Limitations, Scope, Preferences):  Continue to treat the treatable, no escalation of care   PAST MEDICAL HISTORY: Past Medical History:    Diagnosis Date   Arthritis    Borderline diabetes mellitus    BPH (benign prostatic hypertrophy)    DDD (degenerative disc disease), lumbar    GERD (gastroesophageal reflux disease)    History of bladder cancer    Hyperlipidemia    Hypertension    Nocturia    OSA on CPAP    PER STUDY  05/2007  MODERATE OSA   Wears glasses     PAST SURGICAL HISTORY:  Past Surgical History:  Procedure Laterality Date   CYSTOSCOPY W/ RETROGRADES Bilateral 12/30/2014   Procedure: CYSTOSCOPY WITH RETROGRADE PYELOGRAM;  Surgeon: Arvil Persons, MD;  Location: [place];  Service: Urology;  Laterality: Bilateral;   CYSTOSCOPY WITH BIOPSY N/A 03/07/2014   Procedure: CYSTOSCOPY WITH Bladder Washings and Bladder BIOPSY;  Surgeon: Lowella Bandy, MD;  [facility];  Service: Urology;  Laterality: N/A;   CYSTOSCOPY WITH BIOPSY N/A 12/30/2014   Procedure: CYSTOSCOPY WITH BIOPSY;  Surgeon: Arvil Persons, MD;  Location: [facility];  Service: Urology;  Laterality: N/A;   TRANSURETHRAL RESECTION OF BLADDER TUMOR  12-30-2008//   11-18-2008//   10-08-2008   TRANSURETHRAL RESECTION OF PROSTATE N/A 12/30/2014   Procedure: TRANSURETHRAL RESECTION PROSTATE BIOPSY;  Surgeon: Arvil Persons, MD;  Location: [facility];  Service: Urology;  Laterality: N/A;    ALLERGIES:  has No Known Allergies.   MEDICATIONS:  Current Facility-Administered Medications  Medication Dose Route Frequency Provider  Last Rate Last Admin   0.9 %  sodium chloride infusion   Intravenous Continuous Nita Sells, MD 50 mL/hr at 05/03/20 1523 New Bag at 05/03/20 1523   acetaminophen (TYLENOL) tablet 650 mg  650 mg Oral Q6H PRN Shela Leff, MD   650 mg at 05/03/20 1537   Or   acetaminophen (TYLENOL) suppository 650 mg  650 mg Rectal Q6H PRN Shela Leff, MD       apixaban (ELIQUIS) tablet 5 mg  5 mg Oral BID Nita Sells, MD   5 mg at 05/04/20  0858   cefTRIAXone (ROCEPHIN) 2 g in sodium chloride 0.9 % 100 mL IVPB  2 g Intravenous Q24H Leodis Sias T, RPH 200 mL/hr at 05/03/20 1727 2 g at 05/03/20 1727   donepezil (ARICEPT) tablet 10 mg  10 mg Oral q AM Nita Sells, MD   10 mg at 05/04/20 0859   insulin aspart (novoLOG) injection 0-5 Units  0-5 Units Subcutaneous QHS Shela Leff, MD       insulin aspart (novoLOG) injection 0-9 Units  0-9 Units Subcutaneous TID WC Shela Leff, MD   2 Units at 05/04/20 1259    VITAL SIGNS: BP 129/67 (BP Location: Right Arm)    Pulse 66    Temp 98.3 F (36.8 C) (Oral)    Resp 18    SpO2 97%  There were no vitals filed for this visit.  Estimated body mass index is 26.49 kg/m as calculated from the following:   Height as of 03/05/20: 6' (1.829 m).   Weight as of 03/08/20: 88.6 kg.  LABS: CBC:    Component Value Date/Time   WBC 4.5 05/04/2020 0419   HGB 12.0 (L) 05/04/2020 0419   HCT 36.5 (L) 05/04/2020 0419   PLT 157 05/04/2020 0419   Comprehensive Metabolic Panel:    Component Value Date/Time   NA 137 05/04/2020 0419   K 4.0 05/04/2020 0419   CO2 20 (L) 05/04/2020 0419   BUN 31 (H) 05/04/2020 0419   CREATININE 2.08 (H) 05/04/2020 0419   ALBUMIN 2.9 (L) 05/04/2020 0419     Review of Systems  Unable to perform ROS: Dementia   Physical Exam General: NAD, sleeping but easily aroused Cardiovascular: regular rate and rhythm Pulmonary: clear ant fields Abdomen: soft, nontender, + bowel sounds Extremities: no edema, no joint deformities Skin: no rashes, warm, dry Neurological: alert to wife, self, location as hospital, follows simple commands   Prognosis: Guarded   Discharge Planning:  [place] for rehab with Palliative care service follow-up  Recommendations:  DNR/DNI-as confirmed by wife  Continue to treat the treatable per wife's request, no escalation of care, no aggressive interventions  No PEG/artificial feedings,  surgeries  Wife hopeful some improvement in strength (back to prior hospitalization baseline) however also prepared for further decline and EOL over time. Expresses main goal for peace and comfort while allowing every opportunity to thrive preferably not with recurrent hospitalizations.   Will further discuss MOST form tomorrow given wife time to review and discuss with children.   PMT will continue to support and follow.    Palliative Performance Scale: PPS 30%                Wife/HCPOA expressed understanding and was in agreement with this plan.   Thank you for allowing the Palliative Medicine Team to assist in the care of this patient.  Time In: 1400 Time Out: 1510 Time Total: 70 min.   Visit consisted of counseling  and education dealing with the complex and emotionally intense issues of symptom management and palliative care in the setting of serious and potentially life-threatening illness.Greater than 50%  of this time was spent counseling and coordinating care related to the above assessment and plan.  Signed by:  Alda Lea, AGPCNP-BC Palliative Medicine Team  Phone: [phone] Fax: [phone] Pager: [phone] Amion: Bjorn Pippin

## 2020-05-04 NOTE — Plan of Care (Signed)
  Problem: Urinary Elimination: Goal: Signs and symptoms of infection will decrease Outcome: Progressing   Problem: Education: Goal: Knowledge of General Education information will improve Description: Including pain rating scale, medication(s)/side effects and non-pharmacologic comfort measures Outcome: Progressing   Problem: Health Behavior/Discharge Planning: Goal: Ability to manage health-related needs will improve Outcome: Progressing   Problem: Clinical Measurements: Goal: Ability to maintain clinical measurements within normal limits will improve Outcome: Progressing Goal: Will remain free from infection Outcome: Progressing Goal: Diagnostic test results will improve Outcome: Progressing Goal: Respiratory complications will improve Outcome: Progressing Goal: Cardiovascular complication will be avoided Outcome: Progressing

## 2020-05-04 NOTE — Progress Notes (Signed)
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 Ellouise Newer neurologist 07/16/2015 MoCA score 27/30 at that time BPH with bladder cancer in addition-TUR bladder 2009 high-grade TCC Insulin-dependent DM TY 2 RLE DVT 12/04/2019  Last admission 3/82/5053-->9/76/7341 toxic metabolic encephalopathy superimposed on dementia secondary to strep viridans UTI-patient was treated with antibiotics-ID was curb sided and did not feel that an echocardiogram was needed and patient was discharged to home with home health given his significant dementia.  Presented to [facility] ED 6/18?  Seizures per family report Febrile, tachycardic-septic-T-max 101.8 lactic acid 6.4 AKI creatinine 3.09 (baseline 1.7)   Assessment & Plan:   Principal Problem:   UTI (urinary tract infection) Active Problems:   OSA (obstructive sleep apnea)   Type 2 diabetes mellitus (HCC)   Severe sepsis (HCC)   Acute renal failure superimposed on chronic kidney disease (HCC)   Pressure injury of skin   1. Severe sepsis l 2/2 with E. coli, Klebsiella pneumonia and blood cultures 2. Urine culture showing 40,000 colony-forming units of Proteus a. Saline 125 cc/h- ? to 50 cc/h-->saline lock 6/21 b. Empiric ceftriaxone 1 g every 24-I will ask infectious disease an opinion c. XR 2 view 6/20 my over read no concern for aspiration 3. AKI secondary to above 4. mild metabolic acidosis probably from sepsis but also on Metformin therefore component of type B metabolic acidosis in addition a. He was at home on a saline Gtt apparently per PCP b. Labs a.m. holding PTA Lasix 5. Prior bladder tumor/BPH status post surgery 2009 a. Not on Flomax?-Consider the same if unable to pass urine 6. Prior DVT 11/2019 a. Resume apixaban 5 twice daily b. No aspirin at this time and have  held it on [facility] 7. NIDDM a. At home seems to be on Metaglip b.  discontinue completely Metformin from his regimen given his age and risk for side effects-- c. Consider as OP glyburide  d. Do not cover unless sugars persistently above 180 8. Toxic metabolic encephalopathy/moderate to severe dementia stage III-IV a. Resume Aricept 10 every morning b. Mentation has improved although intermittently confused 9. HTN a. History of 10. OSA a. Attempt CPAP at night as per orders 11. QTC prolongation a. Keep on telemetry today and monitor trends as needed Ethics  Palliative care to see the patient later today to help with goals of care he has moderate dementia at least and has been declining   DVT prophylaxis: Apixaban 5 twice daily Code Status: DNR Family Communication:  spoke with son 6/19 --had a UTI late jan and started declining--went from walking and moving around and couldn't get up-they have CNA's helping out at home--this may be from his sciatica and lbp--he is gotten up x 2 per week by sons--most of the time if is a challenge to get him up They are attempting to get a lift bed CNA's come out every am and to ensure that he gets cleaned up 3-4 days a week they have an RN Disposition:  Status is: Inpatient  Remains inpatient appropriate because:Hemodynamically unstable, Persistent severe electrolyte disturbances, Ongoing active pain requiring inpatient pain management and IV treatments appropriate due to intensity of illness or inability to take PO   Dispo: The patient is from: Home              Anticipated d/c is to: With max  home health support as per my discussion with son on 6/19              Anticipated d/c date is: 2 days              Patient currently is not medically stable to d/c. Consultants:   Palliative care  Procedures: none  Antimicrobials: Ceftriaxone  Subjective: Occasional forgetfulness Talks to me about "drinking alcohol" Seems to know to some degree where  he is No chest pain no fever no chills reported   Objective: Vitals:   05/03/20 1538 05/03/20 2154 05/03/20 2156 05/04/20 0530  BP: 125/71  131/77 136/82  Pulse: 71 65 63 63  Resp: 20  18 20   Temp: 98.2 F (36.8 C)  98 F (36.7 C) 98.6 F (37 C)  TempSrc: Oral  Oral Oral  SpO2: 96% 98% 98% 97%    Intake/Output Summary (Last 24 hours) at 05/04/2020 0814 Last data filed at 05/04/2020 0500 Gross per 24 hour  Intake --  Output 1450 ml  Net -1450 ml   There were no vitals filed for this visit.  Examination:  General exam: awake alert  arcus senilis + much clear Respiratory system: Clear to exam and auscultation Cardiovascular system: s1 s 2no m/r/g Gastrointestinal system: No rebound no guarding Central nervous system: intact moving limbs x 4 without deficit-power Extremities: ROM intact but difficulty raising legs Skin: No edema Psychiatry: Much less confused Data Reviewed: I have personally reviewed following labs and imaging studies  BUN/creatinine 44/3.09-->35/2.3-->34/2.1-->31/2.08 Bicarb up from 18-20 Lactic acidosis has resolved WBC 9.9--> 4.5 Hemoglobin 15-->11.7-->12.0 Left shift neutrophilia  Radiology Studies: DG Chest 2 View  Result Date: 05/03/2020 CLINICAL DATA:  Pneumonia EXAM: CHEST - 2 VIEW COMPARISON:  05/01/2020 FINDINGS: Normal cardiac silhouette with ectatic aorta. Low lung volumes. No effusion, infiltrate pneumothorax. No acute osseous abnormality. IMPRESSION: Low lung volumes. No acute findings. Electronically Signed   By: Suzy Bouchard M.D.   On: 05/03/2020 11:08   Scheduled Meds: . apixaban  5 mg Oral BID  . donepezil  10 mg Oral q AM  . insulin aspart  0-5 Units Subcutaneous QHS  . insulin aspart  0-9 Units Subcutaneous TID WC  Cntinuous Infusions: . sodium chloride 50 mL/hr at 05/03/20 1523  . cefTRIAXone (ROCEPHIN)  IV 2 g (05/03/20 1727)    LOS: 2 days   Time spent: 29  Nita Sells, MD [facility] To contact the  attending provider between 7A-7P or the covering provider during after hours 7P-7A, please log into the web site [email or web address] and access using universal [place] password for that web site. If you do not have the password, please call the hospital operator.  05/04/2020, 9:52 AM

## 2020-05-04 NOTE — Progress Notes (Signed)
Pt refuses CPAP QHS. 

## 2020-05-05 LAB — RENAL FUNCTION PANEL
Albumin: 2.9 g/dL — ABNORMAL LOW (ref 3.5–5.0)
Anion gap: 12 (ref 5–15)
BUN: 24 mg/dL — ABNORMAL HIGH (ref 8–23)
CO2: 18 mmol/L — ABNORMAL LOW (ref 22–32)
Calcium: 8.5 mg/dL — ABNORMAL LOW (ref 8.9–10.3)
Chloride: 109 mmol/L (ref 98–111)
Creatinine, Ser: 2.1 mg/dL — ABNORMAL HIGH (ref 0.61–1.24)
GFR calc Af Amer: 33 mL/min — ABNORMAL LOW (ref >60)
GFR calc non Af Amer: 29 mL/min — ABNORMAL LOW (ref >60)
Glucose, Bld: 99 mg/dL (ref 70–99)
Phosphorus: 2.6 mg/dL (ref 2.5–4.6)
Potassium: 5.2 mmol/L — ABNORMAL HIGH (ref 3.5–5.1)
Sodium: 139 mmol/L (ref 135–145)

## 2020-05-05 LAB — GLUCOSE, CAPILLARY
Glucose-Capillary: 71 mg/dL (ref 70–99)
Glucose-Capillary: 141 mg/dL — ABNORMAL HIGH (ref 70–99)
Glucose-Capillary: 104 mg/dL — ABNORMAL HIGH (ref 70–99)
Glucose-Capillary: 113 mg/dL — ABNORMAL HIGH (ref 70–99)

## 2020-05-05 MED ORDER — LORAZEPAM 2 MG/ML IJ SOLN: 0.5000 mg | Freq: Once | INTRAMUSCULAR | Status: DC

## 2020-05-05 MED ORDER — LORAZEPAM 2 MG/ML IJ SOLN
0.5000 mg | Freq: Once | INTRAMUSCULAR | Status: AC
  Administered 2020-05-06: 0.5 mg via INTRAVENOUS
  Filled 2020-05-05: qty 1

## 2020-05-05 NOTE — TOC Initial Note (Signed)
Transition of Care [facility]) - Initial/Assessment Note    Patient Details  Name: Tony Fox MRN: [id number] Date of Birth: [birth year]  [facility] CM/SW Contact:    Joaquin Courts, RN Phone Number: 05/05/2020, 2:57 PM  Clinical Narrative:    CM spoke with patient's spouse regarding recommendation for SNF for short term rehab.  Spouse is agreeable to initiate SNF search and reports she has had good experience with [facility], but is also open to another facility in the [place] area. Spouse reports she does not want to send patient to [facility].  CM faxed FL2 out to all area facilities, awaiting bed offers.                  Expected Discharge Plan: Skilled Nursing Facility Barriers to Discharge: Continued Medical Work up   Patient Goals and CMS Choice Patient states their goals for this hospitalization and ongoing recovery are:: to go to rehab facility CMS Medicare.gov Compare Post Acute Care list provided to:: Patient Represenative (must comment) Choice offered to / list presented to : Spouse  Expected Discharge Plan and Services Expected Discharge Plan: [place]   Discharge Planning Services: CM Consult Post Acute Care Choice: [place] Living arrangements for the past 2 months: Single Family Home                 DME Arranged: N/A DME Agency: NA       HH Arranged: NA HH Agency: NA        Prior Living Arrangements/Services Living arrangements for the past 2 months: [place] Lives with:: Spouse Patient language and need for interpreter reviewed:: Yes Do you feel safe going back to the place where you live?: Yes      Need for Family Participation in Patient Care: Yes (Comment) Care giver support system in place?: Yes (comment)   Criminal Activity/Legal Involvement Pertinent to Current Situation/Hospitalization: No - Comment as needed  Activities of Daily Living Home Assistive Devices/Equipment: Walker  (specify type) ADL Screening (condition at time of admission) Patient's cognitive ability adequate to safely complete daily activities?: No Is the patient deaf or have difficulty hearing?: No Does the patient have difficulty seeing, even when wearing glasses/contacts?: No Does the patient have difficulty concentrating, remembering, or making decisions?: Yes Patient able to express need for assistance with ADLs?: Yes Does the patient have difficulty dressing or bathing?: Yes Independently performs ADLs?: No Communication: Independent Dressing (OT): Needs assistance Is this a change from baseline?: Pre-admission baseline Grooming: Needs assistance Is this a change from baseline?: Pre-admission baseline Feeding: Needs assistance Is this a change from baseline?: Pre-admission baseline Bathing: Needs assistance Is this a change from baseline?: Pre-admission baseline Toileting: Needs assistance Is this a change from baseline?: Pre-admission baseline In/Out Bed: Needs assistance Is this a change from baseline?: Pre-admission baseline Walks in Home: Needs assistance Is this a change from baseline?: Pre-admission baseline Does the patient have difficulty walking or climbing stairs?: Yes Weakness of Legs: Both Weakness of Arms/Hands: Both  Permission Sought/Granted                  Emotional Assessment Appearance:: Appears stated age     Orientation: : Oriented to Self, Fluctuating Orientation (Suspected and/or reported Sundowners)   Psych Involvement: No (comment)  Admission diagnosis:  Pneumonia [J18.9] Sepsis ([place]) [A41.9] Sepsis, due to unspecified organism, unspecified whether acute organ dysfunction present [facility]) [A41.9] Patient Active Problem List   Diagnosis Date Noted  .  Pressure injury of skin 05/04/2020  . Severe sepsis ([place]) 05/02/2020  . UTI (urinary tract infection) 05/02/2020  . Acute renal failure superimposed on chronic kidney disease ([place]) 05/02/2020  .  Acute metabolic encephalopathy 16/55/3748  . Elevated LFTs 03/05/2020  . Type 2 diabetes mellitus ([place]) 03/05/2020  . Acute deep vein thrombosis (DVT) of right femoral vein ([place]) 12/04/2019  . OSA (obstructive sleep apnea) 12/04/2019  . Dyslipidemia associated with type 2 diabetes mellitus ([place]) 12/04/2019  . Vitamin D deficiency 12/04/2019  . Vascular dementia without behavioral disturbance ([place]) 12/04/2019  . Physical deconditioning 12/04/2019  . Elevated troponin level not due to acute coronary syndrome 11/24/2019  . AKI (acute kidney injury) ([place]) 11/24/2019  . Elevated d-dimer 11/24/2019  . Lactic acidosis 11/24/2019  . Generalized weakness 11/24/2019  . Thrombocytopenia ([place]) 11/24/2019  . Memory loss 07/16/2015  . History of bladder cancer 07/16/2015  . Hypertension associated with type 2 diabetes mellitus ([place]) 07/16/2015  . Hyperlipidemia 07/16/2015  . Uncomplicated type 2 diabetes mellitus ([place]) 07/16/2015   PCP:  Willey Blade, MD Pharmacy:   [facility] [address] [place] [address] Phone: [phone] Fax: [place], [place] [address] Phone: [phone] Fax: [phone]     Social Determinants of Health (SDOH) Interventions    Readmission Risk Interventions Readmission Risk Prevention Plan 03/09/2020  Transportation Screening Complete  Medication Review Press photographer) Complete  SW Recovery Care/Counseling Consult Not Complete  Some recent data might be hidden

## 2020-05-05 NOTE — Progress Notes (Signed)
Patient continues to decline nocturnal CPAP. Order changed to prn.  

## 2020-05-05 NOTE — NC FL2 (Signed)
Why LEVEL OF CARE SCREENING TOOL     IDENTIFICATION  Patient Name: Tony Fox Birthdate: November 25, 1940 Sex: male Admission Date (Current Location): 05/01/2020  [facility] and [place] Number:  Herbalist and Address:  [facility],  [place] [address] [place]      Provider Number: [id number]  Attending Physician Name and Address:  Nita Sells, MD  Relative Name and Phone Number:       Current Level of Care: Hospital Recommended Level of Care: [place] Prior Approval Number:    Date Approved/Denied:   PASRR Number: [id number]  Discharge Plan: SNF    Current Diagnoses: Patient Active Problem List   Diagnosis Date Noted  . Pressure injury of skin 05/04/2020  . Severe sepsis ([place]) 05/02/2020  . UTI (urinary tract infection) 05/02/2020  . Acute renal failure superimposed on chronic kidney disease ([place]) 05/02/2020  . Acute metabolic encephalopathy 00/86/7619  . Elevated LFTs 03/05/2020  . Type 2 diabetes mellitus ([place]) 03/05/2020  . Acute deep vein thrombosis (DVT) of right femoral vein ([place]) 12/04/2019  . OSA (obstructive sleep apnea) 12/04/2019  . Dyslipidemia associated with type 2 diabetes mellitus ([place]) 12/04/2019  . Vitamin D deficiency 12/04/2019  . Vascular dementia without behavioral disturbance ([place]) 12/04/2019  . Physical deconditioning 12/04/2019  . Elevated troponin level not due to acute coronary syndrome 11/24/2019  . AKI (acute kidney injury) ([place]) 11/24/2019  . Elevated d-dimer 11/24/2019  . Lactic acidosis 11/24/2019  . Generalized weakness 11/24/2019  . Thrombocytopenia ([place]) 11/24/2019  . Memory loss 07/16/2015  . History of bladder cancer 07/16/2015  . Hypertension associated with type 2 diabetes mellitus ([place]) 07/16/2015  . Hyperlipidemia 07/16/2015  . Uncomplicated type 2 diabetes mellitus ([place]) 07/16/2015    Orientation RESPIRATION BLADDER Height & Weight     Self   Normal Incontinent Weight:   Height:     BEHAVIORAL SYMPTOMS/MOOD NEUROLOGICAL BOWEL NUTRITION STATUS      Incontinent Diet  AMBULATORY STATUS COMMUNICATION OF NEEDS Skin   Extensive Assist Verbally Normal                       Personal Care Assistance Level of Assistance  Bathing, Dressing, Total care Bathing Assistance: Maximum assistance   Dressing Assistance: Maximum assistance Total Care Assistance: Maximum assistance   Functional Limitations Info             SPECIAL CARE FACTORS FREQUENCY  PT (By licensed PT), OT (By licensed OT)     PT Frequency: 5x weekly OT Frequency: 5x weekly            Contractures Contractures Info: Not present    Additional Factors Info  Code Status, Allergies Code Status Info: DNR Allergies Info: NKDA           Current Medications (05/05/2020):  This is the current hospital active medication list Current Facility-Administered Medications  Medication Dose Route Frequency Provider Last Rate Last Admin  . 0.9 %  sodium chloride infusion   Intravenous Continuous Nita Sells, MD 50 mL/hr at 05/03/20 1523 New Bag at 05/03/20 1523  . acetaminophen (TYLENOL) tablet 650 mg  650 mg Oral Q6H PRN Shela Leff, MD   650 mg at 05/03/20 1537   Or  . acetaminophen (TYLENOL) suppository 650 mg  650 mg Rectal Q6H PRN Shela Leff, MD      . amoxicillin-clavulanate (AUGMENTIN) 875-125 MG per tablet 1 tablet  1 tablet Oral Q12H Samtani,  Jai-Gurmukh, MD   1 tablet at 05/05/20 1103  . apixaban (ELIQUIS) tablet 5 mg  5 mg Oral BID Nita Sells, MD   5 mg at 05/05/20 1103  . donepezil (ARICEPT) tablet 10 mg  10 mg Oral q AM Nita Sells, MD   10 mg at 05/05/20 0643  . insulin aspart (novoLOG) injection 0-5 Units  0-5 Units Subcutaneous QHS Shela Leff, MD      . insulin aspart (novoLOG) injection 0-9 Units  0-9 Units Subcutaneous TID WC Shela Leff, MD   1 Units at 05/05/20 1143  . [START ON  05/06/2020] LORazepam (ATIVAN) injection 0.5 mg  0.5 mg Intravenous Once Nita Sells, MD         Discharge Medications: Please see discharge summary for a list of discharge medications.  Relevant Imaging Results:  Relevant Lab Results:   Additional Information [address], [place], [place]

## 2020-05-05 NOTE — Progress Notes (Addendum)
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 Ellouise Newer neurologist 07/16/2015 MoCA score 27/30 at that time BPH with bladder cancer in addition-TUR bladder 2009 high-grade TCC Insulin-dependent DM TY 2 RLE DVT 12/04/2019  Last admission 12/08/5807-->9/83/3825 toxic metabolic encephalopathy superimposed on dementia secondary to strep viridans UTI-patient was treated with antibiotics-ID was curb sided and did not feel that an echocardiogram was needed and patient was discharged to home with home health given his significant dementia.  Presented to [facility] ED 6/18?  Seizures per family report Febrile, tachycardic-septic-T-max 101.8 lactic acid 6.4 AKI creatinine 3.09 (baseline 1.7)   Assessment & Plan:   Principal Problem:   UTI (urinary tract infection) Active Problems:   OSA (obstructive sleep apnea)   Type 2 diabetes mellitus (HCC)   Severe sepsis (HCC)   Acute renal failure superimposed on chronic kidney disease (HCC)   Pressure injury of skin   1. Severe sepsis l 2/2 with E. coli, Klebsiella pneumonia and blood cultures 2. Urine culture showing 40,000 colony-forming units of Proteus a. Saline 125 cc/h- ? to 50 cc/h-->saline lock 6/21 b. Empiric ceftriaxone 1 g every 24-I discussed with Dr. Drucilla Schmidt of infectious disease on 6/21 and he recommends to use a total of 10 to 14 days of Augmentin to complete treatment for the same c. XR 2 view 6/20 my over read no concern for aspiration d. Repeat x-rays probably in the next 4 weeks in the outpatient setting to ensure no concerns for pneumonia 3. AKI secondary to above 4. mild metabolic acidosis probably from sepsis but also on Metformin therefore component of type B metabolic acidosis in addition a. He was at home on a saline Gtt apparently per PCP b. Labs a.m.  holding PTA Lasix-would not resume on discharge c. Continue saline 50 cc an hour for now and hopeful for recovery 5. Prior bladder tumor/BPH status post surgery 2009 a. Not on Flomax?-Consider the same if unable to pass urine 6. Prior DVT 11/2019 a. Resume apixaban 5 twice daily b. No aspirin at this time and have held it on [facility]  7. NIDDM a. At home seems to be on Metaglip b.  discontinue completely Metformin from his regimen given his age and risk for side effects-- c. Consider as OP glyburide  d. Do not cover unless sugars persistently above 180 8. Toxic metabolic encephalopathy/moderate to severe dementia stage III-IV a. Resume Aricept 10 every morning b. Mentation has improved-intermittently agitated but can redirect easily 9. HTN a. History of 10. OSA a. Attempt CPAP at night as per orders 11. QTC prolongation a. Keep on telemetry today and monitor trends as needed Ethics  Palliative care saw the patient on 6/22 and the a.m. to follow-up today with discussion about further goals and MOST form will be presented to family to help discuss goals of care  DVT prophylaxis: Apixaban 5 twice daily Code Status: DNR Family Communication:  spoke with son 6/19 --had a UTI late jan and started declining--went from walking and moving around and couldn't get up-they have CNA's helping out at home--this may be from his sciatica and lbp--he is gotten up x 2 per week by sons--most of the time if is a challenge to get him up They are attempting to get a lift bed  CNA's come out every am and to ensure that he gets cleaned up 3-4  days a week they have an RN ----> Disposition at this time is unclear given he may be too high level of care at home? I called to speak to the wife several times 6/22 to ensure we are on the same page-this position is ultimately to skilled facility as per case manager note Disposition:  Status is: Inpatient  Remains inpatient appropriate because:Hemodynamically unstable,  Persistent severe electrolyte disturbances, Ongoing active pain requiring inpatient pain management and IV treatments appropriate due to intensity of illness or inability to take PO   Dispo: The patient is from: Home              Anticipated d/c is to: With max home health support as per my discussion with son on 6/19-see above note              Anticipated d/c date is: 2 days              Patient currently is not medically stable to d/c. Consultants:   Palliative care  Procedures: none  Antimicrobials: Ceftriaxone  Subjective: Intermittently agitated this morning and overnight and refused to get labs Nursing requested Ativan for next lab draw which we will postpone until tomorrow Overall he is fair eating and drinking and redirectable  Objective: Vitals:   05/04/20 0530 05/04/20 1340 05/04/20 2055 05/05/20 0548  BP: 136/82 129/67 118/66 138/85  Pulse: 63 66 (!) 59 (!) 58  Resp: 20 18 17 16   Temp: 98.6 F (37 C) 98.3 F (36.8 C) 97.6 F (36.4 C) 97.7 F (36.5 C)  TempSrc: Oral Oral Oral Oral  SpO2: 97% 97% 99% 99%    Intake/Output Summary (Last 24 hours) at 05/05/2020 1223 Last data filed at 05/05/2020 0645 Gross per 24 hour  Intake 240 ml  Output 800 ml  Net -560 ml   There were no vitals filed for this visit.  Examination:  General exam: awake alert  arcus senilis slightly agitated at times Respiratory system: Clear to exam and auscultation Cardiovascular system: s1 s 2no m/r/g Gastrointestinal system: No rebound no guarding Central nervous system: intact moving limbs x 4 without deficit-power Extremities: ROM intact but difficulty raising legs Skin: No edema Psychiatry: Less confused reorients well Data Reviewed: I have personally reviewed following labs and imaging studies  -labs hemolyzed 6/22 and this was a fingerstick lab   Radiology Studies: No results found. Scheduled Meds: . amoxicillin-clavulanate  1 tablet Oral Q12H  . apixaban  5 mg Oral BID    . donepezil  10 mg Oral q AM  . insulin aspart  0-5 Units Subcutaneous QHS  . insulin aspart  0-9 Units Subcutaneous TID WC  . [START ON 05/06/2020] LORazepam  0.5 mg Intravenous Once  Cntinuous Infusions: . sodium chloride 50 mL/hr at 05/03/20 1523    LOS: 3 days   Time spent: 25  Nita Sells, MD [facility] To contact the attending provider between 7A-7P or the covering provider during after hours 7P-7A, please log into the web site [email or web address] and access using universal [employer] password for that web site. If you do not have the password, please call the hospital operator.  05/05/2020, 12:23 PM

## 2020-05-06 DIAGNOSIS — G4733 Obstructive sleep apnea (adult) (pediatric): Secondary | ICD-10-CM

## 2020-05-06 LAB — COMPREHENSIVE METABOLIC PANEL
ALT: 12 U/L (ref 0–44)
AST: 16 U/L (ref 15–41)
Albumin: 2.9 g/dL — ABNORMAL LOW (ref 3.5–5.0)
Alkaline Phosphatase: 53 U/L (ref 38–126)
Anion gap: 7 (ref 5–15)
BUN: 19 mg/dL (ref 8–23)
CO2: 24 mmol/L (ref 22–32)
Calcium: 8.6 mg/dL — ABNORMAL LOW (ref 8.9–10.3)
Chloride: 108 mmol/L (ref 98–111)
Creatinine, Ser: 1.97 mg/dL — ABNORMAL HIGH (ref 0.61–1.24)
GFR calc Af Amer: 36 mL/min — ABNORMAL LOW (ref >60)
GFR calc non Af Amer: 31 mL/min — ABNORMAL LOW (ref >60)
Glucose, Bld: 124 mg/dL — ABNORMAL HIGH (ref 70–99)
Potassium: 4.3 mmol/L (ref 3.5–5.1)
Sodium: 139 mmol/L (ref 135–145)
Total Bilirubin: 0.9 mg/dL (ref 0.3–1.2)
Total Protein: 6.2 g/dL — ABNORMAL LOW (ref 6.5–8.1)

## 2020-05-06 LAB — CBC
HCT: 39.8 % (ref 39.0–52.0)
Hemoglobin: 13.3 g/dL (ref 13.0–17.0)
MCH: 30.1 pg (ref 26.0–34.0)
MCHC: 33.4 g/dL (ref 30.0–36.0)
MCV: 90 fL (ref 80.0–100.0)
Platelets: 197 10*3/uL (ref 150–400)
RBC: 4.42 MIL/uL (ref 4.22–5.81)
RDW: 13.2 % (ref 11.5–15.5)
WBC: 4.9 10*3/uL (ref 4.0–10.5)
nRBC: 0 % (ref 0.0–0.2)

## 2020-05-06 LAB — MAGNESIUM: Magnesium: 1.8 mg/dL (ref 1.7–2.4)

## 2020-05-06 LAB — GLUCOSE, CAPILLARY
Glucose-Capillary: 86 mg/dL (ref 70–99)
Glucose-Capillary: 109 mg/dL — ABNORMAL HIGH (ref 70–99)
Glucose-Capillary: 87 mg/dL (ref 70–99)
Glucose-Capillary: 111 mg/dL — ABNORMAL HIGH (ref 70–99)

## 2020-05-06 LAB — SARS CORONAVIRUS 2 (TAT 6-24 HRS): SARS Coronavirus 2: NEGATIVE

## 2020-05-06 NOTE — Care Management Important Message (Signed)
Important Message  Patient Details IM Letter given to [place] Case Manager to present to the Patient Name: GARDNER SERVANTES MRN: [id number] Date of Birth: September 03, 1941   Medicare Important Message Given:  Yes     Kerin Salen 05/06/2020, 10:39 AM

## 2020-05-06 NOTE — Progress Notes (Signed)
Physical Therapy Treatment Patient Details Name: Tony Fox MRN: [id number] DOB: [birth year] Today's Date: 05/06/2020    History of Present Illness  79 y.o. male with medical history significant of dementia, BPH, GERD, hypertension, hyperlipidemia, OSA on CPAP, noninsulin-dependent type 2 diabetes, CKD stage III, history of right lower extremity DVT on Eliquis admitted via EMS with family concerned patient having seizures. Patient diagnosed with Severe sepsis secondary to UTI    PT Comments    Pt agreeable to mobilize however only able to tolerate briefly sitting EOB.  Pt attempting to return to supine.  Pt assisted back to bed and repositioned to comfort.  Continue to recommend SNF upon d/c.   Follow Up Recommendations  SNF     Equipment Recommendations  None recommended by PT    Recommendations for Other Services       Precautions / Restrictions Precautions Precautions: Fall Precaution Comments: dementia (can be agitated per notes however none this session) Restrictions Weight Bearing Restrictions: No    Mobility  Bed Mobility Overal bed mobility: Needs Assistance Bed Mobility: Supine to Sit;Sit to Supine     Supine to sit: Max assist;+2 for physical assistance Sit to supine: Max assist;+2 for physical assistance   General bed mobility comments: pt initiated mobility however requesting assist due to weakness  Transfers                 General transfer comment: pt declined  Ambulation/Gait                 Stairs             Wheelchair Mobility    Modified Rankin (Stroke Patients Only)       Balance Overall balance assessment: Needs assistance Sitting-balance support: Bilateral upper extremity supported;Feet supported Sitting balance-Leahy Scale: Poor Sitting balance - Comments: mostly maintained right forearm prop, able to come upright briefly Postural control: Right lateral lean                                   Cognition Arousal/Alertness: Awake/alert Behavior During Therapy: WFL for tasks assessed/performed Overall Cognitive Status: History of cognitive impairments - at baseline Area of Impairment: Orientation;Following commands;Safety/judgement;Problem solving;Memory;Attention                 Orientation Level: Disoriented to;Situation;Time;Place Current Attention Level: Focused Memory: Decreased short-term memory Following Commands: Follows one step commands with increased time Safety/Judgement: Decreased awareness of safety;Decreased awareness of deficits   Problem Solving: Slow processing;Decreased initiation;Difficulty sequencing;Requires verbal cues;Requires tactile cues General Comments: pt is tangential with conversation and has trouble staying on task      Exercises      General Comments        Pertinent Vitals/Pain Pain Assessment: Faces Faces Pain Scale: Hurts little more Pain Location: "everywhere" during mobility Pain Descriptors / Indicators: Grimacing;Discomfort Pain Intervention(s): Monitored during session;Repositioned    Home Living                      Prior Function            PT Goals (current goals can now be found in the care plan section) Progress towards PT goals: Progressing toward goals    Frequency    Min 2X/week      PT Plan Current plan remains appropriate    Co-evaluation  AM-PAC PT "6 Clicks" Mobility   Outcome Measure  Help needed turning from your back to your side while in a flat bed without using bedrails?: Total Help needed moving from lying on your back to sitting on the side of a flat bed without using bedrails?: Total Help needed moving to and from a bed to a chair (including a wheelchair)?: Total Help needed standing up from a chair using your arms (e.g., wheelchair or bedside chair)?: Total Help needed to walk in hospital room?: Total Help needed climbing 3-5 steps with a railing? :  Total 6 Click Score: 6    End of Session   Activity Tolerance: Other (comment) (pt self limiting likely due to cognition ("I'm an old man now")) Patient left: in bed;with call bell/phone within reach;with bed alarm set   PT Visit Diagnosis: Muscle weakness (generalized) (M62.81);Difficulty in walking, not elsewhere classified (R26.2)     Time: 8270-7867 PT Time Calculation (min) (ACUTE ONLY): 12 min  Charges:  $Therapeutic Activity: 8-22 mins                    Jannette Spanner PT, DPT Acute Rehabilitation Services Pager: [phone] Office: [phone]  York Ram E 05/06/2020, 1:15 PM

## 2020-05-06 NOTE — Progress Notes (Signed)
   Daily Progress Note   Patient Name: Tony Fox       Date: 05/06/2020 DOB: Jul 10, 1941  Age: 79 y.o. MRN#: [id number] Attending Physician: Harold Hedge, MD Primary Care Physician: Willey Blade, MD Admit Date: 05/01/2020  Reason for Consultation/Follow-up: Establishing goals of care  Subjective: Patient resting in bed. No acute distress. Wife is at the bedside.   Wife expressed goals remain clear and set for patient to transfer to SNF once approved and finalized for continued rehab support [facility]). She remains hopeful for stability with clear awareness of patient overall condition and understanding of advancement of dementia. Mrs. Buckman reports she would not want aggressive interventions at this point and is hopeful patient will eventually return home with family.   Education provided on palliative and hospice care. She verbalized understanding. Given goals to allow patient an opportunity to thrive and regains strength if possible at rehab facility outpatient palliative recommended. Wife verbalized understanding and is in agreement with awareness she and family may transition care to a more comfort and hospice approach at anytime.   Support given and all questions answered.    Length of Stay: 4 days  Vital Signs: BP (!) 141/86 (BP Location: Right Arm)   Pulse (!) 55   Temp 98 F (36.7 C)   Resp 20   Wt 83.7 kg   SpO2 100%   BMI 25.03 kg/m  SpO2: SpO2: 100 % O2 Device: O2 Device: Room Air O2 Flow Rate:    Intake/output summary:   Intake/Output Summary (Last 24 hours) at 05/06/2020 1715 Last data filed at 05/06/2020 1400 Gross per 24 hour  Intake 480 ml  Output 1950 ml  Net -1470 ml   LBM:   Baseline Weight: Weight: 83.7 kg Most recent weight: Weight: 83.7 kg           Palliative Care Assessment & Plan    Code Status:  DNR   Goals of Care:  DNR/DNI  Continue current plan of care per medical team, no escalation of care  Family remains  hopeful patient may regain some strength per baseline prior to hospitalization. Realistic in expectations and understanding regarding dementia and illness trajectory.   Outpatient Palliative support (referral placed). Plans to transfer to [facility] once finalized.   PMT will continue to support and follow as needed.   Prognosis: Guarded   Discharge Planning: [place] for rehab with Palliative care service follow-up   Care plan was discussed with patient's wife.   Thank you for allowing the Palliative Medicine Team to assist in the care of this patient.  Time Total: 25 min.   Visit consisted of counseling and education dealing with the complex and emotionally intense issues of symptom management and palliative care in the setting of serious and potentially life-threatening illness.Greater than 50%  of this time was spent counseling and coordinating care related to the above assessment and plan.  Alda Lea, AGPCNP-BC  Palliative Medicine Team [phone]

## 2020-05-06 NOTE — TOC Progression Note (Signed)
Transition of Care [facility]) - Progression Note    Patient Details  Name: Tony Fox MRN: [id number] Date of Birth: 06-05-1941  Transition of Care [facility]) CM/SW Contact  Lennart Pall, [place] Phone Number: 05/06/2020, 2:44 PM  Clinical Narrative:   Have received several SNF bed offers and all reviewed with pt/wife. They have accepted offer at [facility].  Insurance authorization has been started, however, not approved yet so likely transfer tomorrow.  MD aware.    Expected Discharge Plan: [place] Barriers to Discharge: Continued Medical Work up  Expected Discharge Plan and Services Expected Discharge Plan: [facility]   Discharge Planning Services: CM Consult Post Acute Care Choice: [place] Living arrangements for the past 2 months: Single Family Home                 DME Arranged: N/A DME Agency: NA       HH Arranged: NA HH Agency: NA         Social Determinants of Health (SDOH) Interventions    Readmission Risk Interventions Readmission Risk Prevention Plan 05/05/2020 03/09/2020  Transportation Screening Complete Complete  Medication Review Press photographer) Complete Complete  HRI or Home Care Consult Complete -  SW Recovery Care/Counseling Consult Complete Not Complete  Palliative Care Screening Complete -  Skilled Nursing Facility Complete -  Some recent data might be hidden

## 2020-05-06 NOTE — Progress Notes (Signed)
PROGRESS NOTE    Tony Fox    Code Status: DNR  LGX:[id number] DOB: August 13, 1941 DOA: 05/01/2020 LOS: 4 days  PCP: Willey Blade, MD CC:  Chief Complaint  Patient presents with  . 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 history of OSA on CPAP, alzheimers dementia, BPH with bladder cancer and TUR bladder in 2009 with high grade TCC, insulin dependent diabetes, RLE DVT 12/04/2019 with recent admission from 4/22-4/29/21 for toxic metabolic encephalopathy superimposed on dementia secondary to strep viridans UTI and was treated with antibiotics who was discharged to home with [facility] and presented to [facility] on 6/18 for possible seizures per family report with fevers, tachycardia, lactic acidosis 6.4 on admission with AKI and admitted for severe sepsis secondary to Proteus and Klebsiella uti.    A & P   Principal Problem:   UTI (urinary tract infection) Active Problems:   OSA (obstructive sleep apnea)   Type 2 diabetes mellitus (HCC)   Severe sepsis (HCC)   Acute renal failure superimposed on chronic kidney disease (HCC)   Pressure injury of skin   1. Severe sepsis secondary to  Klebsiella pneumonia, Proteus UTI a. Sepsis has resolved b. Blood cultures with no growth to date c. Day 5/10-14 antibiotics, Empiric ceftriaxone 1 g every 24-I discussed with Dr. Drucilla Schmidt of infectious disease on 6/21 and he recommends to use a total of 10 to 14 days of Augmentin to complete treatment for the same  2. AKI on CKD 4 a. Improving and at/near baseline  3. Metabolic acidosis from sepsis and possibly from metformin a. Holding metformin b. Holding lasix  4. Prior bladder tumor/BPH s/p surgery 2009 a. Not on flomax   5. Prior DVT (11/2019) a. Continue eliquis 5 mg BID  6. Type 2 diabetes a. Holding metformin b. Continue current regimen  7. Toxic metabolic encephalopathy/moderate to severe dementia stage III-IV a. Continue Aricept b. Seems  at/near baseline  8. Hypertension, stable  9. OSA  10. Prolonged QTc a. On telemetry  DVT prophylaxis:  apixaban (ELIQUIS) tablet 5 mg   Family Communication: no family at bedside  Disposition Plan:  Status is: Inpatient  Remains inpatient appropriate because:awaiting COVID test   Dispo:  Patient From: Home  Planned Disposition: [place]  Expected discharge date: 05/07/20  Medically stable for discharge: Yes           Pressure injury documentation   Pressure Injury 05/02/20 Buttocks Right;Left Stage 2 -  Partial thickness loss of dermis presenting as a shallow open injury with a red, pink wound bed without slough. (Active)  05/02/20 1440  Location: Buttocks  Location Orientation: Right;Left  Staging: Stage 2 -  Partial thickness loss of dermis presenting as a shallow open injury with a red, pink wound bed without slough.  Wound Description (Comments):   Present on Admission: Yes   None  Consultants  None  Procedures  None  Antibiotics   Anti-infectives (From admission, onward)   Start     Dose/Rate Route Frequency Ordered Stop   05/05/20 1000  amoxicillin-clavulanate (AUGMENTIN) 875-125 MG per tablet 1 tablet     Discontinue     1 tablet Oral Every 12 hours 05/04/20 1759     05/02/20 1800  cefTRIAXone (ROCEPHIN) 2 g in sodium chloride 0.9 % 100 mL IVPB  Status:  Discontinued        2 g 200  mL/hr over 30 Minutes Intravenous Every 24 hours 05/02/20 1632 05/04/20 1759   05/01/20 2245  cefTRIAXone (ROCEPHIN) 1 g in sodium chloride 0.9 % 100 mL IVPB  Status:  Discontinued        1 g 200 mL/hr over 30 Minutes Intravenous Every 24 hours 05/01/20 2241 05/02/20 1632        Subjective   Patient seen and examined at bedside in no acute distress and resting comfortably. No acute events overnight. Denies any acute complaints at this time. Had a BM today. Tolerating diet well.   Objective   Vitals:   05/05/20 2110 05/06/20 0549 05/06/20  0745 05/06/20 1406  BP: 137/89 128/77  (!) 141/86  Pulse: 62 (!) 59  (!) 55  Resp: 19 20    Temp: (!) 97.5 F (36.4 C) 98.6 F (37 C)  98 F (36.7 C)  TempSrc: Oral Oral    SpO2: 100% 99%  100%  Weight:   83.7 kg     Intake/Output Summary (Last 24 hours) at 05/06/2020 1714 Last data filed at 05/06/2020 1400 Gross per 24 hour  Intake 480 ml  Output 1950 ml  Net -1470 ml   Filed Weights   05/06/20 0745  Weight: 83.7 kg    Examination:  Physical Exam Vitals and nursing note reviewed.  Constitutional:      Appearance: Normal appearance.  HENT:     Head: Normocephalic and atraumatic.  Eyes:     Conjunctiva/sclera: Conjunctivae normal.  Cardiovascular:     Rate and Rhythm: Normal rate and regular rhythm.  Pulmonary:     Effort: Pulmonary effort is normal.     Breath sounds: Normal breath sounds.  Abdominal:     General: Abdomen is flat.     Palpations: Abdomen is soft.  Musculoskeletal:        General: No swelling or tenderness.  Skin:    Coloration: Skin is not jaundiced or pale.  Neurological:     Mental Status: He is alert. Mental status is at baseline.  Psychiatric:        Mood and Affect: Mood normal.        Behavior: Behavior normal.     Data Reviewed: I have personally reviewed following labs and imaging studies  CBC: Recent Labs  Lab 05/01/20 2136 05/03/20 0355 05/04/20 0419 05/06/20 0954  WBC 9.9 5.3 4.5 4.9  NEUTROABS 9.3* 3.0 1.8  --   HGB 15.0 11.7* 12.0* 13.3  HCT 45.8 36.4* 36.5* 39.8  MCV 91.2 91.9 90.6 90.0  PLT 177 141* 157 409   Basic Metabolic Panel: Recent Labs  Lab 05/02/20 0030 05/02/20 0628 05/02/20 0928 05/03/20 0355 05/04/20 0419 05/05/20 0801 05/06/20 0954  NA  --    < > 144 142 137 139 139  K  --    < > 2.8* 4.2 4.0 5.2* 4.3  CL  --    < > 122* 109 107 109 108  CO2  --    < > 18* 21* 20* 18* 24  GLUCOSE  --    < > 73 105* 98 99 124*  BUN  --    < > 29* 34* 31* 24* 19  CREATININE  --    < > 1.69* 2.16* 2.08*  2.10* 1.97*  CALCIUM  --    < > 6.2* 8.9 8.3* 8.5* 8.6*  MG 1.2*  --   --  1.8  --   --  1.8  PHOS  --   --   --   --   --  2.6  --    < > = values in this interval not displayed.   GFR: Estimated Creatinine Clearance: 32.3 mL/min (A) (by C-G formula based on SCr of 1.97 mg/dL (H)). Liver Function Tests: Recent Labs  Lab 05/01/20 2136 05/01/20 2136 05/02/20 0928 05/03/20 0355 05/04/20 0419 05/05/20 0801 05/06/20 0954  AST 22  --  15 19 18   --  16  ALT 13  --  10 13 13   --  12  ALKPHOS 66  --  41 51 49  --  53  BILITOT 1.1  --  1.0 0.9 0.7  --  0.9  PROT 7.5  --  4.5* 6.2* 6.2*  --  6.2*  ALBUMIN 3.6   < > 2.1* 2.8* 2.9* 2.9* 2.9*   < > = values in this interval not displayed.   No results for input(s): LIPASE, AMYLASE in the last 168 hours. No results for input(s): AMMONIA in the last 168 hours. Coagulation Profile: Recent Labs  Lab 05/01/20 2136  INR 1.1   Cardiac Enzymes: No results for input(s): CKTOTAL, CKMB, CKMBINDEX, TROPONINI in the last 168 hours. BNP (last 3 results) No results for input(s): PROBNP in the last 8760 hours. HbA1C: No results for input(s): HGBA1C in the last 72 hours. CBG: Recent Labs  Lab 05/05/20 1706 05/05/20 2224 05/06/20 0736 05/06/20 1156 05/06/20 1637  GLUCAP 104* 113* 86 109* 87   Lipid Profile: No results for input(s): CHOL, HDL, LDLCALC, TRIG, CHOLHDL, LDLDIRECT in the last 72 hours. Thyroid Function Tests: No results for input(s): TSH, T4TOTAL, FREET4, T3FREE, THYROIDAB in the last 72 hours. Anemia Panel: No results for input(s): VITAMINB12, FOLATE, FERRITIN, TIBC, IRON, RETICCTPCT in the last 72 hours. Sepsis Labs: Recent Labs  Lab 05/01/20 2136 05/02/20 0030 05/02/20 0928 05/02/20 1128  LATICACIDVEN 6.4* 4.8* 1.6 1.2    Recent Results (from the past 240 hour(s))  Blood Culture (routine x 2)     Status: Abnormal   Collection Time: 05/01/20  9:36 PM   Specimen: BLOOD  Result Value Ref Range Status   Specimen  Description   Final    BLOOD LEFT ANTECUBITAL Performed at [facility], [place] [address] [place], [place] [address]    Special Requests   Final    BOTTLES DRAWN AEROBIC AND ANAEROBIC Blood Culture adequate volume Performed at [place] [address] [place], [place] [address]    Culture  Setup Time   Final    GRAM NEGATIVE RODS IN BOTH AEROBIC AND ANAEROBIC BOTTLES CRITICAL RESULT CALLED TO, READ BACK BY AND VERIFIED WITH: Sanda Klein [id number] MLM Performed at [facility], [address], [place], [place] [address]    Culture KLEBSIELLA PNEUMONIAE (A)  Final   Report Status 05/04/2020 FINAL  Final   Organism ID, Bacteria KLEBSIELLA PNEUMONIAE  Final      Susceptibility   Klebsiella pneumoniae - MIC*    AMPICILLIN RESISTANT Resistant     CEFAZOLIN <=4 SENSITIVE Sensitive     CEFEPIME <=0.12 SENSITIVE Sensitive     CEFTAZIDIME <=1 SENSITIVE Sensitive     CEFTRIAXONE <=0.25 SENSITIVE Sensitive     CIPROFLOXACIN <=0.25 SENSITIVE Sensitive     GENTAMICIN <=1 SENSITIVE Sensitive     IMIPENEM <=0.25 SENSITIVE Sensitive     TRIMETH/SULFA <=20 SENSITIVE Sensitive     AMPICILLIN/SULBACTAM 4 SENSITIVE Sensitive     PIP/TAZO <=4 SENSITIVE Sensitive     * KLEBSIELLA PNEUMONIAE  Urine culture  Status: Abnormal   Collection Time: 05/01/20  9:36 PM   Specimen: In/Out Cath Urine  Result Value Ref Range Status   Specimen Description   Final    IN/OUT CATH URINE Performed at [facility], [place] [address], [place], [place] [address]    Special Requests   Final    NONE Performed at [facility], [place] [address] [place],  [address]    Culture (A)  Final    >=100,000 COLONIES/mL KLEBSIELLA PNEUMONIAE 40,000 COLONIES/mL PROTEUS MIRABILIS    Report Status 05/04/2020 FINAL  Final   Organism ID, Bacteria KLEBSIELLA PNEUMONIAE (A)  Final   Organism ID, Bacteria PROTEUS MIRABILIS (A)  Final       Susceptibility   Klebsiella pneumoniae - MIC*    AMPICILLIN >=32 RESISTANT Resistant     CEFAZOLIN <=4 SENSITIVE Sensitive     CEFTRIAXONE <=0.25 SENSITIVE Sensitive     CIPROFLOXACIN <=0.25 SENSITIVE Sensitive     GENTAMICIN <=1 SENSITIVE Sensitive     IMIPENEM <=0.25 SENSITIVE Sensitive     NITROFURANTOIN 64 INTERMEDIATE Intermediate     TRIMETH/SULFA <=20 SENSITIVE Sensitive     AMPICILLIN/SULBACTAM 4 SENSITIVE Sensitive     PIP/TAZO <=4 SENSITIVE Sensitive     * >=100,000 COLONIES/mL KLEBSIELLA PNEUMONIAE   Proteus mirabilis - MIC*    AMPICILLIN <=2 SENSITIVE Sensitive     CEFAZOLIN <=4 SENSITIVE Sensitive     CEFTRIAXONE <=0.25 SENSITIVE Sensitive     CIPROFLOXACIN <=0.25 SENSITIVE Sensitive     GENTAMICIN <=1 SENSITIVE Sensitive     IMIPENEM 0.5 SENSITIVE Sensitive     NITROFURANTOIN RESISTANT Resistant     TRIMETH/SULFA <=20 SENSITIVE Sensitive     AMPICILLIN/SULBACTAM <=2 SENSITIVE Sensitive     PIP/TAZO <=4 SENSITIVE Sensitive     * 40,000 COLONIES/mL PROTEUS MIRABILIS  SARS Coronavirus 2 by RT PCR (hospital order, performed in [facility] hospital lab) Nasopharyngeal Nasopharyngeal Swab     Status: None   Collection Time: 05/01/20  9:36 PM   Specimen: Nasopharyngeal Swab  Result Value Ref Range Status   SARS Coronavirus 2 NEGATIVE NEGATIVE Final    Comment: (NOTE) SARS-CoV-2 target nucleic acids are NOT DETECTED.  The SARS-CoV-2 RNA is generally detectable in upper and lower respiratory specimens during the acute phase of infection. The lowest concentration of SARS-CoV-2 viral copies this assay can detect is 250 copies / mL. A negative result does not preclude SARS-CoV-2 infection and should not be used as the sole basis for treatment or other patient management decisions.  A negative result may occur with improper specimen collection / handling, submission of specimen other than nasopharyngeal swab, presence of viral mutation(s) within the areas targeted by this  assay, and inadequate number of viral copies (<250 copies / mL). A negative result must be combined with clinical observations, patient history, and epidemiological information.  Fact Sheet for Patients:   StrictlyIdeas.no  Fact Sheet for Healthcare Providers: BankingDealers.co.za  This test is not yet approved or  cleared by the [place] FDA and has been authorized for detection and/or diagnosis of SARS-CoV-2 by FDA under an Emergency Use Authorization (EUA).  This EUA will remain in effect (meaning this test can be used) for the duration of the COVID-19 declaration under Section 564(b)(1) of the Act, 21 U.S.C. section 360bbb-3(b)(1), unless the authorization is terminated or revoked sooner.  Performed at [facility], [place] [address] [place],  [address]   Blood Culture ID Panel (Reflexed)  Status: Abnormal   Collection Time: 05/01/20  9:36 PM  Result Value Ref Range Status   Enterococcus species NOT DETECTED NOT DETECTED Final   Listeria monocytogenes NOT DETECTED NOT DETECTED Final   Staphylococcus species NOT DETECTED NOT DETECTED Final   Staphylococcus aureus (BCID) NOT DETECTED NOT DETECTED Final   Streptococcus species NOT DETECTED NOT DETECTED Final   Streptococcus agalactiae NOT DETECTED NOT DETECTED Final   Streptococcus pneumoniae NOT DETECTED NOT DETECTED Final   Streptococcus pyogenes NOT DETECTED NOT DETECTED Final   Acinetobacter baumannii NOT DETECTED NOT DETECTED Final   Enterobacteriaceae species DETECTED (A) NOT DETECTED Final    Comment: Enterobacteriaceae represent a large family of gram-negative bacteria, not a single organism. CRITICAL RESULT CALLED TO, READ BACK BY AND VERIFIED WITH: PHARMD M BELL [id number] MLM    Enterobacter cloacae complex NOT DETECTED NOT DETECTED Final   Escherichia coli NOT DETECTED NOT DETECTED Final   Klebsiella oxytoca NOT DETECTED NOT DETECTED  Final   Klebsiella pneumoniae DETECTED (A) NOT DETECTED Final    Comment: CRITICAL RESULT CALLED TO, READ BACK BY AND VERIFIED WITH: PHARMD M BELL [id number] MLM    Proteus species NOT DETECTED NOT DETECTED Final   Serratia marcescens NOT DETECTED NOT DETECTED Final   Carbapenem resistance NOT DETECTED NOT DETECTED Final   Haemophilus influenzae NOT DETECTED NOT DETECTED Final   Neisseria meningitidis NOT DETECTED NOT DETECTED Final   Pseudomonas aeruginosa NOT DETECTED NOT DETECTED Final   Candida albicans NOT DETECTED NOT DETECTED Final   Candida glabrata NOT DETECTED NOT DETECTED Final   Candida krusei NOT DETECTED NOT DETECTED Final   Candida parapsilosis NOT DETECTED NOT DETECTED Final   Candida tropicalis NOT DETECTED NOT DETECTED Final    Comment: Performed at [facility], [place] [address], [place], [place] [address]  Blood Culture (routine x 2)     Status: None (Preliminary result)   Collection Time: 05/02/20  1:37 AM   Specimen: BLOOD LEFT FOREARM  Result Value Ref Range Status   Specimen Description   Final    BLOOD LEFT FOREARM Performed at [facility] [address] [place], [place] [address]    Special Requests   Final    BOTTLES DRAWN AEROBIC AND ANAEROBIC Blood Culture adequate volume Performed at [place] [address] [place], [place] [address]    Culture   Final    NO GROWTH 4 DAYS Performed at [facility], [place] [address] [place], [place] [address]    Report Status PENDING  Incomplete         Radiology Studies: No results found.      Scheduled Meds: . amoxicillin-clavulanate  1 tablet Oral Q12H  . apixaban  5 mg Oral BID  . donepezil  10 mg Oral q AM  . insulin aspart  0-5 Units Subcutaneous QHS  . insulin aspart  0-9 Units Subcutaneous TID WC   Continuous Infusions: . sodium chloride 50 mL/hr at 05/03/20 1523     Time spent: 25 minutes with over 50% of the time coordinating the  patient's care    Harold Hedge, DO [facility] Pager [phone]  Call night coverage person covering after 7pm

## 2020-05-07 DIAGNOSIS — A419 Sepsis, unspecified organism: Secondary | ICD-10-CM | POA: Diagnosis not present

## 2020-05-07 DIAGNOSIS — N400 Enlarged prostate without lower urinary tract symptoms: Secondary | ICD-10-CM | POA: Diagnosis not present

## 2020-05-07 DIAGNOSIS — N401 Enlarged prostate with lower urinary tract symptoms: Secondary | ICD-10-CM | POA: Diagnosis not present

## 2020-05-07 DIAGNOSIS — Z86718 Personal history of other venous thrombosis and embolism: Secondary | ICD-10-CM | POA: Diagnosis not present

## 2020-05-07 DIAGNOSIS — E11649 Type 2 diabetes mellitus with hypoglycemia without coma: Secondary | ICD-10-CM | POA: Diagnosis not present

## 2020-05-07 DIAGNOSIS — R5381 Other malaise: Secondary | ICD-10-CM | POA: Diagnosis not present

## 2020-05-07 DIAGNOSIS — Z7401 Bed confinement status: Secondary | ICD-10-CM | POA: Diagnosis not present

## 2020-05-07 DIAGNOSIS — G301 Alzheimer's disease with late onset: Secondary | ICD-10-CM | POA: Diagnosis not present

## 2020-05-07 DIAGNOSIS — R4182 Altered mental status, unspecified: Secondary | ICD-10-CM | POA: Diagnosis not present

## 2020-05-07 DIAGNOSIS — R410 Disorientation, unspecified: Secondary | ICD-10-CM | POA: Diagnosis not present

## 2020-05-07 DIAGNOSIS — E119 Type 2 diabetes mellitus without complications: Secondary | ICD-10-CM | POA: Diagnosis not present

## 2020-05-07 DIAGNOSIS — F0390 Unspecified dementia without behavioral disturbance: Secondary | ICD-10-CM | POA: Diagnosis not present

## 2020-05-07 DIAGNOSIS — G309 Alzheimer's disease, unspecified: Secondary | ICD-10-CM | POA: Diagnosis not present

## 2020-05-07 DIAGNOSIS — M2550 Pain in unspecified joint: Secondary | ICD-10-CM | POA: Diagnosis not present

## 2020-05-07 DIAGNOSIS — M6281 Muscle weakness (generalized): Secondary | ICD-10-CM | POA: Diagnosis not present

## 2020-05-07 DIAGNOSIS — N390 Urinary tract infection, site not specified: Secondary | ICD-10-CM | POA: Diagnosis not present

## 2020-05-07 DIAGNOSIS — N184 Chronic kidney disease, stage 4 (severe): Secondary | ICD-10-CM | POA: Diagnosis not present

## 2020-05-07 DIAGNOSIS — F411 Generalized anxiety disorder: Secondary | ICD-10-CM | POA: Diagnosis not present

## 2020-05-07 DIAGNOSIS — G4733 Obstructive sleep apnea (adult) (pediatric): Secondary | ICD-10-CM | POA: Diagnosis not present

## 2020-05-07 DIAGNOSIS — R451 Restlessness and agitation: Secondary | ICD-10-CM | POA: Diagnosis not present

## 2020-05-07 LAB — GLUCOSE, CAPILLARY
Glucose-Capillary: 59 mg/dL — ABNORMAL LOW (ref 70–99)
Glucose-Capillary: 61 mg/dL — ABNORMAL LOW (ref 70–99)
Glucose-Capillary: 121 mg/dL — ABNORMAL HIGH (ref 70–99)
Glucose-Capillary: 80 mg/dL (ref 70–99)

## 2020-05-07 LAB — CULTURE, BLOOD (ROUTINE X 2)
Culture: NO GROWTH
Special Requests: ADEQUATE

## 2020-05-07 MED ORDER — AMOXICILLIN-POT CLAVULANATE 400-57 MG/5ML PO SUSR: 875.0000 mg | Freq: Two times a day (BID) | ORAL | Status: DC

## 2020-05-07 MED ORDER — AMOXICILLIN-POT CLAVULANATE 400-57 MG/5ML PO SUSR: 875.0000 mg | Freq: Two times a day (BID) | ORAL | 0 refills | Status: AC

## 2020-05-07 MED ORDER — AMOXICILLIN-POT CLAVULANATE 875-125 MG PO TABS
1.0000 | ORAL_TABLET | Freq: Two times a day (BID) | ORAL | 0 refills | Status: DC
Start: 2020-05-07 — End: 2020-05-07

## 2020-05-07 NOTE — Progress Notes (Addendum)
Agree with this [facility] student documentation.

## 2020-05-07 NOTE — Discharge Summary (Signed)
Physician Discharge Summary  Tony Fox XBM:[id number] DOB: 1940/11/28   PCP: Willey Blade, MD  Admit date: 05/01/2020 Discharge date: 05/07/2020 Length of Stay: 5 days   Code Status: DNR  Admitted From:  Home Discharged to:  SNF Discharge Condition:  Stable  Recommendations for Outpatient Follow-up   1. Follow up with PCP with BMP/CBC  2. Lasix, Metaglip discontinued at discharge due to concern for adverse effects during hospitalization as below. 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 history of OSA on CPAP, alzheimers dementia, BPH with bladder cancer and TUR bladder in 2009 with high grade TCC, insulin dependent diabetes, RLE DVT 12/04/2019 with recent admission from 4/22-4/29/21 for toxic metabolic encephalopathy superimposed on dementia secondary to strep viridans UTI and was treated with antibiotics who was discharged to home with [facility] and presented to [facility] on 6/18 for possible seizures per family report with fevers, tachycardia, lactic acidosis 6.4 on admission with AKI and admitted for severe sepsis secondary to Proteus and Klebsiella uti.   A & P   Principal Problem:   UTI (urinary tract infection) Active Problems:   OSA (obstructive sleep apnea)   Type 2 diabetes mellitus (HCC)   Severe sepsis (HCC)   Acute renal failure superimposed on chronic kidney disease (HCC)   Pressure injury of skin    1. Severe sepsis secondary to Klebsiella pneumonia, Proteus UTI a. Sepsis has resolved b. Blood cultures with no growth to date c. Completed  5/10 days antibiotics, initially with Empiric ceftriaxone 1 g every 24, hospitalist discussed with Dr. Drucilla Schmidt of infectious disease on 6/21 and he recommends to use a total of 10 to 14 days of Augmentin to complete treatment for the same d. Discharged with Augmentin solution to help with pill burden to complete 10 days antibiotics.  2. AKI on CKD 4, at/near  baseline a. Lasix held at discharge  3. Metabolic acidosis from sepsis and possibly from metformin a. Holding metformin b. Holding lasix  4. Hypoglycemia a. Glucose 61->59 day of discharge, resolved to 121 b. Hold oral hypoglycemics for now, reevaluate as an outpatient  4. Prior bladder tumor/BPH s/p surgery 2009 a. Not on flomax   5. Prior DVT (11/2019) a. Continue eliquis 5 mg BID  6. Type 2 diabetes a. HA1c 8.7->6.8 with some hypoglycemia b. Holding metformin as above c. Reevaluate hypoglycemics as an outpatient, continue carb control diet, may be able to get away with diet control only. CrCl 32 (keep this in mind when considering meds)  7. Toxic metabolic encephalopathy/moderate to severe dementia stage III-IV a. Continue Aricept b. at/near baseline  8. Hypertension, stable  9. OSA  10. Prolonged QTc, no issues on telemetry    Consultants  . none  Procedures  . none  Antibiotics   Anti-infectives (From admission, onward)   Start     Dose/Rate Route Frequency Ordered Stop   05/07/20 2200  amoxicillin-clavulanate (AUGMENTIN) 400-57 MG/5ML suspension 875 mg     Discontinue     875 mg Oral Every 12 hours 05/07/20 1045     05/07/20 0000  amoxicillin-clavulanate (AUGMENTIN) 875-125 MG tablet  Status:  Discontinued        1 tablet Oral 2 times daily 05/07/20 1115 05/07/20    05/07/20 0000  amoxicillin-clavulanate (AUGMENTIN) 400-57 MG/5ML suspension     Discontinue     875 mg Oral Every 12 hours 05/07/20 1116 05/12/20 2359  05/05/20 1000  amoxicillin-clavulanate (AUGMENTIN) 875-125 MG per tablet 1 tablet  Status:  Discontinued        1 tablet Oral Every 12 hours 05/04/20 1759 05/07/20 1045   05/02/20 1800  cefTRIAXone (ROCEPHIN) 2 g in sodium chloride 0.9 % 100 mL IVPB  Status:  Discontinued        2 g 200 mL/hr over 30 Minutes Intravenous Every 24 hours 05/02/20 1632 05/04/20 1759   05/01/20 2245  cefTRIAXone (ROCEPHIN) 1 g in sodium chloride 0.9 % 100 mL  IVPB  Status:  Discontinued        1 g 200 mL/hr over 30 Minutes Intravenous Every 24 hours 05/01/20 2241 05/02/20 1632       Subjective  Patient seen and examined at bedside no acute distress and resting comfortably.  Asymptomatic hypoglycemia this morning which resolved. Tolerating diet.   Denies any chest pain, shortness of breath, fever, nausea, vomiting, urinary or bowel complaints. Otherwise ROS negative    Objective   Discharge Exam: Vitals:   05/06/20 2134 05/07/20 0547  BP: 136/86 (!) 147/95  Pulse: (!) 58 (!) 59  Resp: 16 16  Temp: 97.8 F (36.6 C) (!) 97.5 F (36.4 C)  SpO2: 100% 100%   Vitals:   05/06/20 0745 05/06/20 1406 05/06/20 2134 05/07/20 0547  BP:  (!) 141/86 136/86 (!) 147/95  Pulse:  (!) 55 (!) 58 (!) 59  Resp:   16 16  Temp:  98 F (36.7 C) 97.8 F (36.6 C) (!) 97.5 F (36.4 C)  TempSrc:   Oral Oral  SpO2:  100% 100% 100%  Weight: 83.7 kg       Physical Exam Vitals and nursing note reviewed.  Constitutional:      Appearance: Normal appearance.  HENT:     Head: Normocephalic and atraumatic.  Eyes:     Conjunctiva/sclera: Conjunctivae normal.  Cardiovascular:     Rate and Rhythm: Normal rate and regular rhythm.  Pulmonary:     Effort: Pulmonary effort is normal.     Breath sounds: Normal breath sounds.  Abdominal:     General: Abdomen is flat.     Palpations: Abdomen is soft.  Musculoskeletal:        General: No swelling or tenderness.  Skin:    Coloration: Skin is not jaundiced or pale.  Neurological:     Mental Status: He is alert. Mental status is at baseline.     Comments: At baseline dementia  Psychiatric:        Mood and Affect: Mood normal.        Behavior: Behavior normal.       The results of significant diagnostics from this hospitalization (including imaging, microbiology, ancillary and laboratory) are listed below for reference.     Microbiology: Recent Results (from the past 240 hour(s))  Blood Culture  (routine x 2)     Status: Abnormal   Collection Time: 05/01/20  9:36 PM   Specimen: BLOOD  Result Value Ref Range Status   Specimen Description   Final    BLOOD LEFT ANTECUBITAL Performed at [facility] [address] [place], [place] [address]    Special Requests   Final    BOTTLES DRAWN AEROBIC AND ANAEROBIC Blood Culture adequate volume Performed at [facility] [address], [place], [place] [address]    Culture  Setup Time   Final    GRAM NEGATIVE RODS IN BOTH AEROBIC AND ANAEROBIC BOTTLES CRITICAL RESULT CALLED  TO, READ BACK BY AND VERIFIED WITH: Sanda Klein [id number] MLM Performed at [facility], [address], [place], [place] [address]    Culture KLEBSIELLA PNEUMONIAE (A)  Final   Report Status 05/04/2020 FINAL  Final   Organism ID, Bacteria KLEBSIELLA PNEUMONIAE  Final      Susceptibility   Klebsiella pneumoniae - MIC*    AMPICILLIN RESISTANT Resistant     CEFAZOLIN <=4 SENSITIVE Sensitive     CEFEPIME <=0.12 SENSITIVE Sensitive     CEFTAZIDIME <=1 SENSITIVE Sensitive     CEFTRIAXONE <=0.25 SENSITIVE Sensitive     CIPROFLOXACIN <=0.25 SENSITIVE Sensitive     GENTAMICIN <=1 SENSITIVE Sensitive     IMIPENEM <=0.25 SENSITIVE Sensitive     TRIMETH/SULFA <=20 SENSITIVE Sensitive     AMPICILLIN/SULBACTAM 4 SENSITIVE Sensitive     PIP/TAZO <=4 SENSITIVE Sensitive     * KLEBSIELLA PNEUMONIAE  Urine culture     Status: Abnormal   Collection Time: 05/01/20  9:36 PM   Specimen: In/Out Cath Urine  Result Value Ref Range Status   Specimen Description   Final    IN/OUT CATH URINE Performed at [facility] [address], [place], [place] [address]    Special Requests   Final    NONE Performed at [facility], [place] [address], [place], [place] [address]    Culture (A)  Final    >=100,000 COLONIES/mL KLEBSIELLA PNEUMONIAE 40,000 COLONIES/mL PROTEUS MIRABILIS    Report Status  05/04/2020 FINAL  Final   Organism ID, Bacteria KLEBSIELLA PNEUMONIAE (A)  Final   Organism ID, Bacteria PROTEUS MIRABILIS (A)  Final      Susceptibility   Klebsiella pneumoniae - MIC*    AMPICILLIN >=32 RESISTANT Resistant     CEFAZOLIN <=4 SENSITIVE Sensitive     CEFTRIAXONE <=0.25 SENSITIVE Sensitive     CIPROFLOXACIN <=0.25 SENSITIVE Sensitive     GENTAMICIN <=1 SENSITIVE Sensitive     IMIPENEM <=0.25 SENSITIVE Sensitive     NITROFURANTOIN 64 INTERMEDIATE Intermediate     TRIMETH/SULFA <=20 SENSITIVE Sensitive     AMPICILLIN/SULBACTAM 4 SENSITIVE Sensitive     PIP/TAZO <=4 SENSITIVE Sensitive     * >=100,000 COLONIES/mL KLEBSIELLA PNEUMONIAE   Proteus mirabilis - MIC*    AMPICILLIN <=2 SENSITIVE Sensitive     CEFAZOLIN <=4 SENSITIVE Sensitive     CEFTRIAXONE <=0.25 SENSITIVE Sensitive     CIPROFLOXACIN <=0.25 SENSITIVE Sensitive     GENTAMICIN <=1 SENSITIVE Sensitive     IMIPENEM 0.5 SENSITIVE Sensitive     NITROFURANTOIN RESISTANT Resistant     TRIMETH/SULFA <=20 SENSITIVE Sensitive     AMPICILLIN/SULBACTAM <=2 SENSITIVE Sensitive     PIP/TAZO <=4 SENSITIVE Sensitive     * 40,000 COLONIES/mL PROTEUS MIRABILIS  SARS Coronavirus 2 by RT PCR (hospital order, performed in [place] [facility] hospital lab) Nasopharyngeal Nasopharyngeal Swab     Status: None   Collection Time: 05/01/20  9:36 PM   Specimen: Nasopharyngeal Swab  Result Value Ref Range Status   SARS Coronavirus 2 NEGATIVE NEGATIVE Final    Comment: (NOTE) SARS-CoV-2 target nucleic acids are NOT DETECTED.  The SARS-CoV-2 RNA is generally detectable in upper and lower respiratory specimens during the acute phase of infection. The lowest concentration of SARS-CoV-2 viral copies this assay can detect is 250 copies / mL. A negative result does not preclude SARS-CoV-2 infection and should not be used as the sole basis for treatment or other patient management decisions.  A negative result may occur with improper specimen  collection / handling, submission of specimen other than nasopharyngeal swab, presence of viral mutation(s) within the areas targeted by this assay, and inadequate number of viral copies (<250 copies / mL). A negative result must be combined with clinical observations, patient history, and epidemiological information.  Fact Sheet for Patients:   StrictlyIdeas.no  Fact Sheet for Healthcare Providers: BankingDealers.co.za  This test is not yet approved or  cleared by the [place] FDA and has been authorized for detection and/or diagnosis of SARS-CoV-2 by FDA under an Emergency Use Authorization (EUA).  This EUA will remain in effect (meaning this test can be used) for the duration of the COVID-19 declaration under Section 564(b)(1) of the Act, 21 U.S.C. section 360bbb-3(b)(1), unless the authorization is terminated or revoked sooner.  Performed at [facility], [place] [address], [place], [place] [address]   Blood Culture ID Panel (Reflexed)     Status: Abnormal   Collection Time: 05/01/20  9:36 PM  Result Value Ref Range Status   Enterococcus species NOT DETECTED NOT DETECTED Final   Listeria monocytogenes NOT DETECTED NOT DETECTED Final   Staphylococcus species NOT DETECTED NOT DETECTED Final   Staphylococcus aureus (BCID) NOT DETECTED NOT DETECTED Final   Streptococcus species NOT DETECTED NOT DETECTED Final   Streptococcus agalactiae NOT DETECTED NOT DETECTED Final   Streptococcus pneumoniae NOT DETECTED NOT DETECTED Final   Streptococcus pyogenes NOT DETECTED NOT DETECTED Final   Acinetobacter baumannii NOT DETECTED NOT DETECTED Final   Enterobacteriaceae species DETECTED (A) NOT DETECTED Final    Comment: Enterobacteriaceae represent a large family of gram-negative bacteria, not a single organism. CRITICAL RESULT CALLED TO, READ BACK BY AND VERIFIED WITH: PHARMD M BELL [id number] 1628 MLM    Enterobacter  cloacae complex NOT DETECTED NOT DETECTED Final   Escherichia coli NOT DETECTED NOT DETECTED Final   Klebsiella oxytoca NOT DETECTED NOT DETECTED Final   Klebsiella pneumoniae DETECTED (A) NOT DETECTED Final    Comment: CRITICAL RESULT CALLED TO, READ BACK BY AND VERIFIED WITH: PHARMD M BELL [id number] 1628 MLM    Proteus species NOT DETECTED NOT DETECTED Final   Serratia marcescens NOT DETECTED NOT DETECTED Final   Carbapenem resistance NOT DETECTED NOT DETECTED Final   Haemophilus influenzae NOT DETECTED NOT DETECTED Final   Neisseria meningitidis NOT DETECTED NOT DETECTED Final   Pseudomonas aeruginosa NOT DETECTED NOT DETECTED Final   Candida albicans NOT DETECTED NOT DETECTED Final   Candida glabrata NOT DETECTED NOT DETECTED Final   Candida krusei NOT DETECTED NOT DETECTED Final   Candida parapsilosis NOT DETECTED NOT DETECTED Final   Candida tropicalis NOT DETECTED NOT DETECTED Final    Comment: Performed at [facility], [place] [address], [place], [place] [address]  Blood Culture (routine x 2)     Status: None   Collection Time: 05/02/20  1:37 AM   Specimen: BLOOD LEFT FOREARM  Result Value Ref Range Status   Specimen Description   Final    BLOOD LEFT FOREARM Performed at [facility] [address], [place], [place] [address]    Special Requests   Final    BOTTLES DRAWN AEROBIC AND ANAEROBIC Blood Culture adequate volume Performed at [facility] [address] [place], [place] [address]    Culture   Final    NO GROWTH 5 DAYS Performed at [facility], [place] [address], [place],  [address]  Report Status 05/07/2020 FINAL  Final  SARS CORONAVIRUS 2 (TAT 6-24 HRS) Nasopharyngeal Nasopharyngeal Swab     Status: None   Collection Time: 05/06/20  3:34 PM   Specimen: Nasopharyngeal Swab  Result Value Ref Range Status   SARS Coronavirus 2 NEGATIVE NEGATIVE Final    Comment: (NOTE) SARS-CoV-2 target nucleic acids are NOT  DETECTED.  The SARS-CoV-2 RNA is generally detectable in upper and lower respiratory specimens during the acute phase of infection. Negative results do not preclude SARS-CoV-2 infection, do not rule out co-infections with other pathogens, and should not be used as the sole basis for treatment or other patient management decisions. Negative results must be combined with clinical observations, patient history, and epidemiological information. The expected result is Negative.  Fact Sheet for Patients: SugarRoll.be  Fact Sheet for Healthcare Providers: https://www.woods-mathews.com/  This test is not yet approved or cleared by the [place] FDA and  has been authorized for detection and/or diagnosis of SARS-CoV-2 by FDA under an Emergency Use Authorization (EUA). This EUA will remain  in effect (meaning this test can be used) for the duration of the COVID-19 declaration under Se ction 564(b)(1) of the Act, 21 U.S.C. section 360bbb-3(b)(1), unless the authorization is terminated or revoked sooner.  Performed at [facility], [place] [address] [place], [place] [address]      Labs: BNP (last 3 results) Recent Labs    11/24/19 1412  BNP 19.6   Basic Metabolic Panel: Recent Labs  Lab 05/02/20 0030 05/02/20 0628 05/02/20 0928 05/03/20 0355 05/04/20 0419 05/05/20 0801 05/06/20 0954  NA  --    < > 144 142 137 139 139  K  --    < > 2.8* 4.2 4.0 5.2* 4.3  CL  --    < > 122* 109 107 109 108  CO2  --    < > 18* 21* 20* 18* 24  GLUCOSE  --    < > 73 105* 98 99 124*  BUN  --    < > 29* 34* 31* 24* 19  CREATININE  --    < > 1.69* 2.16* 2.08* 2.10* 1.97*  CALCIUM  --    < > 6.2* 8.9 8.3* 8.5* 8.6*  MG 1.2*  --   --  1.8  --   --  1.8  PHOS  --   --   --   --   --  2.6  --    < > = values in this interval not displayed.   Liver Function Tests: Recent Labs  Lab 05/01/20 2136 05/01/20 2136 05/02/20 0928 05/03/20 0355  05/04/20 0419 05/05/20 0801 05/06/20 0954  AST 22  --  15 19 18   --  16  ALT 13  --  10 13 13   --  12  ALKPHOS 66  --  41 51 49  --  53  BILITOT 1.1  --  1.0 0.9 0.7  --  0.9  PROT 7.5  --  4.5* 6.2* 6.2*  --  6.2*  ALBUMIN 3.6   < > 2.1* 2.8* 2.9* 2.9* 2.9*   < > = values in this interval not displayed.   No results for input(s): LIPASE, AMYLASE in the last 168 hours. No results for input(s): AMMONIA in the last 168 hours. CBC: Recent Labs  Lab 05/01/20 2136 05/03/20 0355 05/04/20 0419 05/06/20 0954  WBC 9.9 5.3 4.5 4.9  NEUTROABS 9.3* 3.0 1.8  --   HGB 15.0 11.7* 12.0*  13.3  HCT 45.8 36.4* 36.5* 39.8  MCV 91.2 91.9 90.6 90.0  PLT 177 141* 157 197   Cardiac Enzymes: No results for input(s): CKTOTAL, CKMB, CKMBINDEX, TROPONINI in the last 168 hours. BNP: Invalid input(s): POCBNP CBG: Recent Labs  Lab 05/06/20 1637 05/06/20 2205 05/07/20 0746 05/07/20 0829 05/07/20 0929  GLUCAP 87 111* 61* 59* 121*   D-Dimer No results for input(s): DDIMER in the last 72 hours. Hgb A1c No results for input(s): HGBA1C in the last 72 hours. Lipid Profile No results for input(s): CHOL, HDL, LDLCALC, TRIG, CHOLHDL, LDLDIRECT in the last 72 hours. Thyroid function studies No results for input(s): TSH, T4TOTAL, T3FREE, THYROIDAB in the last 72 hours.  Invalid input(s): FREET3 Anemia work up No results for input(s): VITAMINB12, FOLATE, FERRITIN, TIBC, IRON, RETICCTPCT in the last 72 hours. Urinalysis    Component Value Date/Time   COLORURINE YELLOW 05/01/2020 2136   APPEARANCEUR HAZY (A) 05/01/2020 2136   LABSPEC 1.012 05/01/2020 2136   PHURINE 7.0 05/01/2020 2136   GLUCOSEU NEGATIVE 05/01/2020 2136   HGBUR SMALL (A) 05/01/2020 2136   BILIRUBINUR NEGATIVE 05/01/2020 2136   KETONESUR NEGATIVE 05/01/2020 2136   PROTEINUR 30 (A) 05/01/2020 2136   UROBILINOGEN 0.2 09/23/2008 0237   NITRITE NEGATIVE 05/01/2020 2136   LEUKOCYTESUR LARGE (A) 05/01/2020 2136   Sepsis  Labs Invalid input(s): PROCALCITONIN,  WBC,  LACTICIDVEN Microbiology Recent Results (from the past 240 hour(s))  Blood Culture (routine x 2)     Status: Abnormal   Collection Time: 05/01/20  9:36 PM   Specimen: BLOOD  Result Value Ref Range Status   Specimen Description   Final    BLOOD LEFT ANTECUBITAL Performed at [facility], [place] [address] [place], [place] [address]    Special Requests   Final    BOTTLES DRAWN AEROBIC AND ANAEROBIC Blood Culture adequate volume Performed at [facility] [address], [place], [place] [address]    Culture  Setup Time   Final    GRAM NEGATIVE RODS IN BOTH AEROBIC AND ANAEROBIC BOTTLES CRITICAL RESULT CALLED TO, READ BACK BY AND VERIFIED WITH: Sanda Klein [id number] MLM Performed at [facility], [address], [place], [place] [address]    Culture KLEBSIELLA PNEUMONIAE (A)  Final   Report Status 05/04/2020 FINAL  Final   Organism ID, Bacteria KLEBSIELLA PNEUMONIAE  Final      Susceptibility   Klebsiella pneumoniae - MIC*    AMPICILLIN RESISTANT Resistant     CEFAZOLIN <=4 SENSITIVE Sensitive     CEFEPIME <=0.12 SENSITIVE Sensitive     CEFTAZIDIME <=1 SENSITIVE Sensitive     CEFTRIAXONE <=0.25 SENSITIVE Sensitive     CIPROFLOXACIN <=0.25 SENSITIVE Sensitive     GENTAMICIN <=1 SENSITIVE Sensitive     IMIPENEM <=0.25 SENSITIVE Sensitive     TRIMETH/SULFA <=20 SENSITIVE Sensitive     AMPICILLIN/SULBACTAM 4 SENSITIVE Sensitive     PIP/TAZO <=4 SENSITIVE Sensitive     * KLEBSIELLA PNEUMONIAE  Urine culture     Status: Abnormal   Collection Time: 05/01/20  9:36 PM   Specimen: In/Out Cath Urine  Result Value Ref Range Status   Specimen Description   Final    IN/OUT CATH URINE Performed at [facility] [address], [place], [place] [address]    Special Requests   Final    NONE Performed at [facility], [place] [address], [place], [place] [address]  Culture (A)  Final    >=100,000 COLONIES/mL KLEBSIELLA PNEUMONIAE 40,000 COLONIES/mL PROTEUS MIRABILIS    Report Status 05/04/2020 FINAL  Final   Organism ID, Bacteria KLEBSIELLA PNEUMONIAE (A)  Final   Organism ID, Bacteria PROTEUS MIRABILIS (A)  Final      Susceptibility   Klebsiella pneumoniae - MIC*    AMPICILLIN >=32 RESISTANT Resistant     CEFAZOLIN <=4 SENSITIVE Sensitive     CEFTRIAXONE <=0.25 SENSITIVE Sensitive     CIPROFLOXACIN <=0.25 SENSITIVE Sensitive     GENTAMICIN <=1 SENSITIVE Sensitive     IMIPENEM <=0.25 SENSITIVE Sensitive     NITROFURANTOIN 64 INTERMEDIATE Intermediate     TRIMETH/SULFA <=20 SENSITIVE Sensitive     AMPICILLIN/SULBACTAM 4 SENSITIVE Sensitive     PIP/TAZO <=4 SENSITIVE Sensitive     * >=100,000 COLONIES/mL KLEBSIELLA PNEUMONIAE   Proteus mirabilis - MIC*    AMPICILLIN <=2 SENSITIVE Sensitive     CEFAZOLIN <=4 SENSITIVE Sensitive     CEFTRIAXONE <=0.25 SENSITIVE Sensitive     CIPROFLOXACIN <=0.25 SENSITIVE Sensitive     GENTAMICIN <=1 SENSITIVE Sensitive     IMIPENEM 0.5 SENSITIVE Sensitive     NITROFURANTOIN RESISTANT Resistant     TRIMETH/SULFA <=20 SENSITIVE Sensitive     AMPICILLIN/SULBACTAM <=2 SENSITIVE Sensitive     PIP/TAZO <=4 SENSITIVE Sensitive     * 40,000 COLONIES/mL PROTEUS MIRABILIS  SARS Coronavirus 2 by RT PCR (hospital order, performed in [facility] hospital lab) Nasopharyngeal Nasopharyngeal Swab     Status: None   Collection Time: 05/01/20  9:36 PM   Specimen: Nasopharyngeal Swab  Result Value Ref Range Status   SARS Coronavirus 2 NEGATIVE NEGATIVE Final    Comment: (NOTE) SARS-CoV-2 target nucleic acids are NOT DETECTED.  The SARS-CoV-2 RNA is generally detectable in upper and lower respiratory specimens during the acute phase of infection. The lowest concentration of SARS-CoV-2 viral copies this assay can detect is 250 copies / mL. A negative result does not preclude SARS-CoV-2 infection and should not be used as  the sole basis for treatment or other patient management decisions.  A negative result may occur with improper specimen collection / handling, submission of specimen other than nasopharyngeal swab, presence of viral mutation(s) within the areas targeted by this assay, and inadequate number of viral copies (<250 copies / mL). A negative result must be combined with clinical observations, patient history, and epidemiological information.  Fact Sheet for Patients:   StrictlyIdeas.no  Fact Sheet for Healthcare Providers: BankingDealers.co.za  This test is not yet approved or  cleared by the [place] FDA and has been authorized for detection and/or diagnosis of SARS-CoV-2 by FDA under an Emergency Use Authorization (EUA).  This EUA will remain in effect (meaning this test can be used) for the duration of the COVID-19 declaration under Section 564(b)(1) of the Act, 21 U.S.C. section 360bbb-3(b)(1), unless the authorization is terminated or revoked sooner.  Performed at [facility], [place] [address] [place], [place] [address]   Blood Culture ID Panel (Reflexed)     Status: Abnormal   Collection Time: 05/01/20  9:36 PM  Result Value Ref Range Status   Enterococcus species NOT DETECTED NOT DETECTED Final   Listeria monocytogenes NOT DETECTED NOT DETECTED Final   Staphylococcus species NOT DETECTED NOT DETECTED Final   Staphylococcus aureus (BCID) NOT DETECTED NOT DETECTED Final   Streptococcus species NOT DETECTED NOT DETECTED Final   Streptococcus agalactiae NOT DETECTED NOT DETECTED Final   Streptococcus pneumoniae  NOT DETECTED NOT DETECTED Final   Streptococcus pyogenes NOT DETECTED NOT DETECTED Final   Acinetobacter baumannii NOT DETECTED NOT DETECTED Final   Enterobacteriaceae species DETECTED (A) NOT DETECTED Final    Comment: Enterobacteriaceae represent a large family of gram-negative bacteria, not a single  organism. CRITICAL RESULT CALLED TO, READ BACK BY AND VERIFIED WITH: PHARMD M BELL [id number] 1628 MLM    Enterobacter cloacae complex NOT DETECTED NOT DETECTED Final   Escherichia coli NOT DETECTED NOT DETECTED Final   Klebsiella oxytoca NOT DETECTED NOT DETECTED Final   Klebsiella pneumoniae DETECTED (A) NOT DETECTED Final    Comment: CRITICAL RESULT CALLED TO, READ BACK BY AND VERIFIED WITH: PHARMD M BELL [id number] 1628 MLM    Proteus species NOT DETECTED NOT DETECTED Final   Serratia marcescens NOT DETECTED NOT DETECTED Final   Carbapenem resistance NOT DETECTED NOT DETECTED Final   Haemophilus influenzae NOT DETECTED NOT DETECTED Final   Neisseria meningitidis NOT DETECTED NOT DETECTED Final   Pseudomonas aeruginosa NOT DETECTED NOT DETECTED Final   Candida albicans NOT DETECTED NOT DETECTED Final   Candida glabrata NOT DETECTED NOT DETECTED Final   Candida krusei NOT DETECTED NOT DETECTED Final   Candida parapsilosis NOT DETECTED NOT DETECTED Final   Candida tropicalis NOT DETECTED NOT DETECTED Final    Comment: Performed at [facility], [place] [address], [place], [place] [address]  Blood Culture (routine x 2)     Status: None   Collection Time: 05/02/20  1:37 AM   Specimen: BLOOD LEFT FOREARM  Result Value Ref Range Status   Specimen Description   Final    BLOOD LEFT FOREARM Performed at [facility] [address] [place], [place] [id number]    Special Requests   Final    BOTTLES DRAWN AEROBIC AND ANAEROBIC Blood Culture adequate volume Performed at [facility] [address], [place], [place] [address]    Culture   Final    NO GROWTH 5 DAYS Performed at [facility], [place] [address], [place], [place] [address]    Report Status 05/07/2020 FINAL  Final  SARS CORONAVIRUS 2 (TAT 6-24 HRS) Nasopharyngeal Nasopharyngeal Swab     Status: None   Collection Time: 05/06/20  3:34 PM   Specimen: Nasopharyngeal Swab  Result Value Ref  Range Status   SARS Coronavirus 2 NEGATIVE NEGATIVE Final    Comment: (NOTE) SARS-CoV-2 target nucleic acids are NOT DETECTED.  The SARS-CoV-2 RNA is generally detectable in upper and lower respiratory specimens during the acute phase of infection. Negative results do not preclude SARS-CoV-2 infection, do not rule out co-infections with other pathogens, and should not be used as the sole basis for treatment or other patient management decisions. Negative results must be combined with clinical observations, patient history, and epidemiological information. The expected result is Negative.  Fact Sheet for Patients: SugarRoll.be  Fact Sheet for Healthcare Providers: https://www.woods-mathews.com/  This test is not yet approved or cleared by the [place] FDA and  has been authorized for detection and/or diagnosis of SARS-CoV-2 by FDA under an Emergency Use Authorization (EUA). This EUA will remain  in effect (meaning this test can be used) for the duration of the COVID-19 declaration under Se ction 564(b)(1) of the Act, 21 U.S.C. section 360bbb-3(b)(1), unless the authorization is terminated or revoked sooner.  Performed at [facility], [place] [address], [place], [place] [address]     Discharge Instructions  Discharge Instructions    Diet - low sodium heart healthy   Complete by: As directed    Discharge instructions   Complete by: As directed    - Stop taking Metaglip - Stop taking lasix - take Augmentin twice daily for the next 5 days, first dose this evening - get lab work next week - follow up with your primary care physician once you are discharged If you have any significant change or worsening of your symptoms, do not hesitate to contact your primary care physician or return to the ED.   Discharge wound care:   Complete by: As directed    Increase activity slowly   Complete by: As directed      Allergies as  of 05/07/2020   No Known Allergies     Medication List    STOP taking these medications   furosemide 20 MG tablet Commonly known as: LASIX   glipizide-metformin 2.5-250 MG tablet Commonly known as: METAGLIP     TAKE these medications   acetaminophen 500 MG tablet Commonly known as: TYLENOL Take 1,000 mg by mouth every 6 (six) hours as needed (for pain).   amoxicillin-clavulanate 400-57 MG/5ML suspension Commonly known as: AUGMENTIN Take 10.9 mLs (875 mg total) by mouth every 12 (twelve) hours for 5 days.   apixaban 5 MG Tabs tablet Commonly known as: ELIQUIS Take 1 tablet (5 mg total) by mouth 2 (two) times daily.   aspirin EC 81 MG tablet Take 81 mg by mouth daily.   donepezil 10 MG tablet Commonly known as: ARICEPT Take 10 mg by mouth in the morning.   liver oil-zinc oxide 40 % ointment Commonly known as: DESITIN Apply 1 application topically as needed for irritation (to affected sites).   NON FORMULARY Diet - Liquids: _X__Regular; ___Thickened ___ Consistency: ___ Nectar, ___Honey, ___ Pudding; ____ Fluid Restriction General Diet: NAS CON CHO   VITAMIN D-3 PO Take 1 capsule by mouth See admin instructions. Take 1 capsule by mouth 2 times a week- on Mondays and Fridays            Discharge Care Instructions  (From admission, onward)         Start     Ordered   05/07/20 0000  Discharge wound care:        05/07/20 1115          Contact information for after-discharge care    Destination    [facility] Preferred SNF .   Service: Skilled Nursing Contact information: [address] [place] [address] [place] [phone]                 No Known Allergies    Dispo:  Patient From: Home  Planned Disposition: [place]  Expected discharge date: 05/07/20  Medically stable for discharge: yes        Time coordinating discharge: Over 30 minutes   SIGNED:   Harold Hedge, D.O. [facility] Pager: [phone]  05/07/2020, 11:25 AM

## 2020-05-07 NOTE — Progress Notes (Signed)
Patient discharged to [facility].  Report given to nurse at [facility].

## 2020-05-07 NOTE — Progress Notes (Signed)
Attempted to give amoxicillin per orders broke tablet in half due to size. Patient had difficulty swallowing the half tab but did so successfully with a couple sips of water, offered other half with apple sauce but patient declined and stated " tries not to take too much medication". Gave him reasons antibiotic was ordered, but he still refused. Notified pharmacy if liquid could possible be ordered and notified his nurse Manuela Schwartz.

## 2020-05-11 ENCOUNTER — Ambulatory Visit (HOSPITAL_COMMUNITY): Payer: Medicare PPO

## 2020-05-11 DIAGNOSIS — G309 Alzheimer's disease, unspecified: Secondary | ICD-10-CM | POA: Diagnosis not present

## 2020-05-11 DIAGNOSIS — R451 Restlessness and agitation: Secondary | ICD-10-CM | POA: Diagnosis not present

## 2020-05-19 DIAGNOSIS — N400 Enlarged prostate without lower urinary tract symptoms: Secondary | ICD-10-CM | POA: Diagnosis not present

## 2020-05-19 DIAGNOSIS — E119 Type 2 diabetes mellitus without complications: Secondary | ICD-10-CM | POA: Diagnosis not present

## 2020-05-19 DIAGNOSIS — G4733 Obstructive sleep apnea (adult) (pediatric): Secondary | ICD-10-CM | POA: Diagnosis not present

## 2020-05-19 DIAGNOSIS — G301 Alzheimer's disease with late onset: Secondary | ICD-10-CM | POA: Diagnosis not present

## 2020-05-21 DIAGNOSIS — G4733 Obstructive sleep apnea (adult) (pediatric): Secondary | ICD-10-CM | POA: Diagnosis not present

## 2020-05-21 DIAGNOSIS — N184 Chronic kidney disease, stage 4 (severe): Secondary | ICD-10-CM | POA: Diagnosis not present

## 2020-05-21 DIAGNOSIS — G309 Alzheimer's disease, unspecified: Secondary | ICD-10-CM | POA: Diagnosis not present

## 2020-05-21 DIAGNOSIS — N390 Urinary tract infection, site not specified: Secondary | ICD-10-CM | POA: Diagnosis not present

## 2020-05-21 DIAGNOSIS — F411 Generalized anxiety disorder: Secondary | ICD-10-CM | POA: Diagnosis not present

## 2020-05-21 DIAGNOSIS — E11649 Type 2 diabetes mellitus with hypoglycemia without coma: Secondary | ICD-10-CM | POA: Diagnosis not present

## 2020-05-21 DIAGNOSIS — N401 Enlarged prostate with lower urinary tract symptoms: Secondary | ICD-10-CM | POA: Diagnosis not present

## 2020-05-21 DIAGNOSIS — Z86718 Personal history of other venous thrombosis and embolism: Secondary | ICD-10-CM | POA: Diagnosis not present

## 2020-05-21 DIAGNOSIS — M6281 Muscle weakness (generalized): Secondary | ICD-10-CM | POA: Diagnosis not present

## 2020-05-25 ENCOUNTER — Encounter (HOSPITAL_COMMUNITY): Payer: Self-pay

## 2020-05-25 ENCOUNTER — Ambulatory Visit (HOSPITAL_COMMUNITY): Payer: Medicare PPO

## 2020-05-25 DIAGNOSIS — F0280 Dementia in other diseases classified elsewhere without behavioral disturbance: Secondary | ICD-10-CM | POA: Diagnosis not present

## 2020-05-25 DIAGNOSIS — I129 Hypertensive chronic kidney disease with stage 1 through stage 4 chronic kidney disease, or unspecified chronic kidney disease: Secondary | ICD-10-CM | POA: Diagnosis not present

## 2020-05-25 DIAGNOSIS — E1122 Type 2 diabetes mellitus with diabetic chronic kidney disease: Secondary | ICD-10-CM | POA: Diagnosis not present

## 2020-05-25 DIAGNOSIS — B964 Proteus (mirabilis) (morganii) as the cause of diseases classified elsewhere: Secondary | ICD-10-CM | POA: Diagnosis not present

## 2020-05-25 DIAGNOSIS — N184 Chronic kidney disease, stage 4 (severe): Secondary | ICD-10-CM | POA: Diagnosis not present

## 2020-05-25 DIAGNOSIS — G309 Alzheimer's disease, unspecified: Secondary | ICD-10-CM | POA: Diagnosis not present

## 2020-05-25 DIAGNOSIS — F419 Anxiety disorder, unspecified: Secondary | ICD-10-CM | POA: Diagnosis not present

## 2020-05-25 DIAGNOSIS — N390 Urinary tract infection, site not specified: Secondary | ICD-10-CM | POA: Diagnosis not present

## 2020-05-25 DIAGNOSIS — B9689 Other specified bacterial agents as the cause of diseases classified elsewhere: Secondary | ICD-10-CM | POA: Diagnosis not present

## 2020-05-26 DIAGNOSIS — I129 Hypertensive chronic kidney disease with stage 1 through stage 4 chronic kidney disease, or unspecified chronic kidney disease: Secondary | ICD-10-CM | POA: Diagnosis not present

## 2020-05-26 DIAGNOSIS — B964 Proteus (mirabilis) (morganii) as the cause of diseases classified elsewhere: Secondary | ICD-10-CM | POA: Diagnosis not present

## 2020-05-26 DIAGNOSIS — B9689 Other specified bacterial agents as the cause of diseases classified elsewhere: Secondary | ICD-10-CM | POA: Diagnosis not present

## 2020-05-26 DIAGNOSIS — N184 Chronic kidney disease, stage 4 (severe): Secondary | ICD-10-CM | POA: Diagnosis not present

## 2020-05-26 DIAGNOSIS — N390 Urinary tract infection, site not specified: Secondary | ICD-10-CM | POA: Diagnosis not present

## 2020-05-26 DIAGNOSIS — E1122 Type 2 diabetes mellitus with diabetic chronic kidney disease: Secondary | ICD-10-CM | POA: Diagnosis not present

## 2020-05-26 DIAGNOSIS — F0280 Dementia in other diseases classified elsewhere without behavioral disturbance: Secondary | ICD-10-CM | POA: Diagnosis not present

## 2020-05-26 DIAGNOSIS — G309 Alzheimer's disease, unspecified: Secondary | ICD-10-CM | POA: Diagnosis not present

## 2020-05-26 DIAGNOSIS — F419 Anxiety disorder, unspecified: Secondary | ICD-10-CM | POA: Diagnosis not present

## 2020-06-03 DIAGNOSIS — B964 Proteus (mirabilis) (morganii) as the cause of diseases classified elsewhere: Secondary | ICD-10-CM | POA: Diagnosis not present

## 2020-06-03 DIAGNOSIS — F0280 Dementia in other diseases classified elsewhere without behavioral disturbance: Secondary | ICD-10-CM | POA: Diagnosis not present

## 2020-06-03 DIAGNOSIS — F419 Anxiety disorder, unspecified: Secondary | ICD-10-CM | POA: Diagnosis not present

## 2020-06-03 DIAGNOSIS — N184 Chronic kidney disease, stage 4 (severe): Secondary | ICD-10-CM | POA: Diagnosis not present

## 2020-06-03 DIAGNOSIS — N390 Urinary tract infection, site not specified: Secondary | ICD-10-CM | POA: Diagnosis not present

## 2020-06-03 DIAGNOSIS — I129 Hypertensive chronic kidney disease with stage 1 through stage 4 chronic kidney disease, or unspecified chronic kidney disease: Secondary | ICD-10-CM | POA: Diagnosis not present

## 2020-06-03 DIAGNOSIS — E1122 Type 2 diabetes mellitus with diabetic chronic kidney disease: Secondary | ICD-10-CM | POA: Diagnosis not present

## 2020-06-03 DIAGNOSIS — G309 Alzheimer's disease, unspecified: Secondary | ICD-10-CM | POA: Diagnosis not present

## 2020-06-03 DIAGNOSIS — B9689 Other specified bacterial agents as the cause of diseases classified elsewhere: Secondary | ICD-10-CM | POA: Diagnosis not present

## 2020-06-05 DIAGNOSIS — E1122 Type 2 diabetes mellitus with diabetic chronic kidney disease: Secondary | ICD-10-CM | POA: Diagnosis not present

## 2020-06-05 DIAGNOSIS — F419 Anxiety disorder, unspecified: Secondary | ICD-10-CM | POA: Diagnosis not present

## 2020-06-05 DIAGNOSIS — G309 Alzheimer's disease, unspecified: Secondary | ICD-10-CM | POA: Diagnosis not present

## 2020-06-05 DIAGNOSIS — B964 Proteus (mirabilis) (morganii) as the cause of diseases classified elsewhere: Secondary | ICD-10-CM | POA: Diagnosis not present

## 2020-06-05 DIAGNOSIS — I129 Hypertensive chronic kidney disease with stage 1 through stage 4 chronic kidney disease, or unspecified chronic kidney disease: Secondary | ICD-10-CM | POA: Diagnosis not present

## 2020-06-05 DIAGNOSIS — N184 Chronic kidney disease, stage 4 (severe): Secondary | ICD-10-CM | POA: Diagnosis not present

## 2020-06-05 DIAGNOSIS — N390 Urinary tract infection, site not specified: Secondary | ICD-10-CM | POA: Diagnosis not present

## 2020-06-05 DIAGNOSIS — B9689 Other specified bacterial agents as the cause of diseases classified elsewhere: Secondary | ICD-10-CM | POA: Diagnosis not present

## 2020-06-05 DIAGNOSIS — F0280 Dementia in other diseases classified elsewhere without behavioral disturbance: Secondary | ICD-10-CM | POA: Diagnosis not present

## 2020-06-06 DIAGNOSIS — I4510 Unspecified right bundle-branch block: Secondary | ICD-10-CM | POA: Diagnosis not present

## 2020-06-06 DIAGNOSIS — R404 Transient alteration of awareness: Secondary | ICD-10-CM | POA: Diagnosis not present

## 2020-06-06 DIAGNOSIS — I499 Cardiac arrhythmia, unspecified: Secondary | ICD-10-CM | POA: Diagnosis not present

## 2020-06-06 DIAGNOSIS — R0602 Shortness of breath: Secondary | ICD-10-CM | POA: Diagnosis not present

## 2020-06-14 DIAGNOSIS — 419620001 Death: Secondary | SNOMED CT | POA: Diagnosis not present

## 2020-06-14 DEATH — deceased
